# Patient Record
Sex: Female | Born: 1940 | Hispanic: No
Health system: Southern US, Community
[De-identification: ages and names within clinical notes are randomized; demographics above are authoritative.]

## PROBLEM LIST (undated history)

## (undated) DIAGNOSIS — K219 Gastro-esophageal reflux disease without esophagitis: Secondary | ICD-10-CM

## (undated) DIAGNOSIS — M542 Cervicalgia: Secondary | ICD-10-CM

## (undated) DIAGNOSIS — F32A Depression, unspecified: Secondary | ICD-10-CM

## (undated) DIAGNOSIS — M2550 Pain in unspecified joint: Secondary | ICD-10-CM

## (undated) DIAGNOSIS — M519 Unspecified thoracic, thoracolumbar and lumbosacral intervertebral disc disorder: Secondary | ICD-10-CM

## (undated) DIAGNOSIS — M1990 Unspecified osteoarthritis, unspecified site: Secondary | ICD-10-CM

## (undated) DIAGNOSIS — K635 Polyp of colon: Secondary | ICD-10-CM

## (undated) DIAGNOSIS — M545 Low back pain, unspecified: Secondary | ICD-10-CM

## (undated) DIAGNOSIS — M4802 Spinal stenosis, cervical region: Secondary | ICD-10-CM

## (undated) DIAGNOSIS — M8580 Other specified disorders of bone density and structure, unspecified site: Secondary | ICD-10-CM

## (undated) DIAGNOSIS — E7800 Pure hypercholesterolemia, unspecified: Secondary | ICD-10-CM

## (undated) DIAGNOSIS — R9431 Abnormal electrocardiogram [ECG] [EKG]: Secondary | ICD-10-CM

## (undated) DIAGNOSIS — K2270 Barrett's esophagus without dysplasia: Secondary | ICD-10-CM

## (undated) HISTORY — DX: Pure hypercholesterolemia, unspecified: E78.00

## (undated) HISTORY — PX: APPENDECTOMY (OPEN): SHX54

## (undated) HISTORY — DX: Unspecified thoracic, thoracolumbar and lumbosacral intervertebral disc disorder: M51.9

## (undated) HISTORY — DX: Spinal stenosis, cervical region: M48.02

## (undated) HISTORY — DX: Pain in unspecified joint: M25.50

## (undated) HISTORY — DX: Barrett's esophagus without dysplasia: K22.70

## (undated) HISTORY — DX: Low back pain, unspecified: M54.50

## (undated) HISTORY — DX: Unspecified osteoarthritis, unspecified site: M19.90

## (undated) HISTORY — PX: KNEE SURGERY: SHX244

## (undated) HISTORY — DX: Abnormal electrocardiogram (ECG) (EKG): R94.31

## (undated) HISTORY — DX: Depression, unspecified: F32.A

## (undated) HISTORY — DX: Cervicalgia: M54.2

## (undated) HISTORY — DX: Other specified disorders of bone density and structure, unspecified site: M85.80

## (undated) HISTORY — DX: Gastro-esophageal reflux disease without esophagitis: K21.9

## (undated) HISTORY — DX: Polyp of colon: K63.5

---

## 1971-07-08 HISTORY — PX: TUBAL LIGATION: SHX77

## 2002-12-23 ENCOUNTER — Ambulatory Visit
Admit: 2002-12-23 | Disposition: A | Discharge status: Home / Self Care | Payer: Self-pay | Source: Ambulatory Visit | Admitting: Family Medicine

## 2004-12-26 ENCOUNTER — Ambulatory Visit
Admit: 2004-12-26 | Disposition: A | Discharge status: Home / Self Care | Payer: Self-pay | Source: Ambulatory Visit | Admitting: Family Medicine

## 2006-01-13 ENCOUNTER — Emergency Department: Admit: 2006-01-13 | Payer: Self-pay | Source: Emergency Department | Admitting: Emergency Medicine

## 2006-04-03 ENCOUNTER — Ambulatory Visit: Admit: 2006-04-03 | Disposition: A | Discharge status: Home / Self Care | Payer: Self-pay | Source: Ambulatory Visit

## 2006-05-19 ENCOUNTER — Ambulatory Visit
Admit: 2006-05-19 | Disposition: A | Discharge status: Home / Self Care | Payer: Self-pay | Source: Ambulatory Visit | Admitting: Family Medicine

## 2006-08-21 ENCOUNTER — Ambulatory Visit
Admit: 2006-08-21 | Disposition: A | Discharge status: Home / Self Care | Payer: Self-pay | Source: Ambulatory Visit | Admitting: Family Medicine

## 2007-05-01 ENCOUNTER — Ambulatory Visit
Admit: 2007-05-01 | Disposition: A | Discharge status: Home / Self Care | Payer: Self-pay | Source: Ambulatory Visit | Admitting: Family Medicine

## 2007-09-29 ENCOUNTER — Ambulatory Visit
Admit: 2007-09-29 | Disposition: A | Discharge status: Home / Self Care | Payer: Self-pay | Source: Ambulatory Visit | Admitting: Family Medicine

## 2007-10-07 ENCOUNTER — Ambulatory Visit
Admit: 2007-10-07 | Disposition: A | Discharge status: Home / Self Care | Payer: Self-pay | Source: Ambulatory Visit | Admitting: Family Medicine

## 2008-06-16 ENCOUNTER — Ambulatory Visit
Admit: 2008-06-16 | Disposition: A | Discharge status: Home / Self Care | Payer: Self-pay | Source: Ambulatory Visit | Admitting: Medical

## 2009-08-07 ENCOUNTER — Ambulatory Visit
Admit: 2009-08-07 | Disposition: A | Discharge status: Home / Self Care | Payer: Self-pay | Source: Ambulatory Visit | Admitting: Medical

## 2010-06-19 ENCOUNTER — Ambulatory Visit
Admit: 2010-06-19 | Disposition: A | Discharge status: Home / Self Care | Payer: Self-pay | Source: Ambulatory Visit | Admitting: Medical

## 2010-09-21 ENCOUNTER — Ambulatory Visit
Admit: 2010-09-21 | Disposition: A | Discharge status: Home / Self Care | Payer: Self-pay | Source: Ambulatory Visit | Admitting: Medical

## 2010-09-25 ENCOUNTER — Ambulatory Visit
Admit: 2010-09-25 | Disposition: A | Discharge status: Home / Self Care | Payer: Self-pay | Source: Ambulatory Visit | Admitting: Medical

## 2010-10-17 ENCOUNTER — Ambulatory Visit: Admit: 2010-10-17 | Discharge: 2010-10-17 | Payer: Self-pay | Source: Ambulatory Visit

## 2012-01-30 ENCOUNTER — Other Ambulatory Visit: Payer: Self-pay | Admitting: Medical

## 2012-01-30 DIAGNOSIS — Z1231 Encounter for screening mammogram for malignant neoplasm of breast: Secondary | ICD-10-CM

## 2012-02-02 ENCOUNTER — Ambulatory Visit: Payer: Medicare Other

## 2012-02-07 ENCOUNTER — Ambulatory Visit: Payer: Medicare Other

## 2012-02-09 ENCOUNTER — Ambulatory Visit: Payer: Medicare Other | Attending: Medical

## 2012-02-09 DIAGNOSIS — Z1231 Encounter for screening mammogram for malignant neoplasm of breast: Secondary | ICD-10-CM | POA: Insufficient documentation

## 2013-02-01 ENCOUNTER — Ambulatory Visit (INDEPENDENT_AMBULATORY_CARE_PROVIDER_SITE_OTHER): Payer: Self-pay | Admitting: Family Medicine

## 2013-02-02 ENCOUNTER — Ambulatory Visit (INDEPENDENT_AMBULATORY_CARE_PROVIDER_SITE_OTHER): Payer: Self-pay | Admitting: Family Medicine

## 2013-02-18 ENCOUNTER — Ambulatory Visit (INDEPENDENT_AMBULATORY_CARE_PROVIDER_SITE_OTHER): Payer: Medicare Other | Admitting: Family Medicine

## 2013-02-18 ENCOUNTER — Encounter (INDEPENDENT_AMBULATORY_CARE_PROVIDER_SITE_OTHER): Payer: Self-pay | Admitting: Family Medicine

## 2013-02-18 VITALS — BP 107/58 | HR 72 | Ht 60.0 in | Wt 111.6 lb

## 2013-02-18 DIAGNOSIS — M949 Disorder of cartilage, unspecified: Secondary | ICD-10-CM

## 2013-02-18 DIAGNOSIS — Z9889 Other specified postprocedural states: Secondary | ICD-10-CM

## 2013-02-18 DIAGNOSIS — Z01818 Encounter for other preprocedural examination: Secondary | ICD-10-CM

## 2013-02-18 DIAGNOSIS — M549 Dorsalgia, unspecified: Secondary | ICD-10-CM

## 2013-02-18 DIAGNOSIS — M8580 Other specified disorders of bone density and structure, unspecified site: Secondary | ICD-10-CM

## 2013-02-18 DIAGNOSIS — Z139 Encounter for screening, unspecified: Secondary | ICD-10-CM

## 2013-02-18 DIAGNOSIS — K2270 Barrett's esophagus without dysplasia: Secondary | ICD-10-CM

## 2013-02-18 DIAGNOSIS — D696 Thrombocytopenia, unspecified: Secondary | ICD-10-CM

## 2013-02-18 DIAGNOSIS — M542 Cervicalgia: Secondary | ICD-10-CM | POA: Insufficient documentation

## 2013-02-18 DIAGNOSIS — G8929 Other chronic pain: Secondary | ICD-10-CM

## 2013-02-18 NOTE — Progress Notes (Signed)
Subjective:       Patient ID: Michele [name] is a 72 y.o. female.    HPI    Preop: endoscopy, Dr Gwenlyn Perking  . Barrett esophagus      Follow up by Dr Gwenlyn Perking , complains of heart burn, on Prilosec       . S/P colonoscopic polypectomy      In 2009 shows tubulovillous polyps     . Chronic back pain      MRI lumbar spine in 2012 degenerative changes involving the lumbosacral spine. Right lateral disc protrusion at the L3-L4 level associated with right L3 nerve root impingement. 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 Annular tears involving the L4-L5 and L5-S1 intervertebral discs.(2012), stable  not taking  Pain medications      . Osteopenia      Taking Calcium and vit D      . Neck pain      MRI cervical spine in 2011 shows Multilevel degenerative changes of the cervical spine resulting in marked, central canal and bilateral neural foraminal stenoses at multiple levels.       . Thrombocytopenia     family history disease : father  Stress test 2011 normal  Social; house cleaning, married, 4 chidren, from [place]  The following portions of the patient's history were reviewed and updated as appropriate:   She  has a past medical history of Gastroesophageal reflux disease; Barrett's esophagus; Abnormal EKG; Arthralgia; Cervical spinal stenosis; Osteopenia; Depression; Colonic polyp; and Osteoarthritis.  She  does not have any pertinent problems on file.  She  has no past surgical history on file.  Her family history is not on file.  She  reports that she has been smoking Cigarettes.  She has a 25 pack-year smoking history. She does not have any smokeless tobacco history on file. Her alcohol and drug histories not on file.  She has a current medication list which includes the following prescription(s): cholecalciferol, cyanocobalamin, multivitamin, and omeprazole.  No current outpatient prescriptions on file prior to visit.     She  has no known allergies..    Review of Systems    Constitutional: Negative for fever, chills, diaphoresis, activity change, appetite change, fatigue and unexpected weight change.   HENT: Negative for hearing loss, ear pain, nosebleeds, congestion, sore throat, facial swelling, rhinorrhea, sneezing, drooling, mouth sores, trouble swallowing, neck pain, neck stiffness, dental problem, voice change, postnasal drip, tinnitus and ear discharge.    Eyes: Negative for photophobia, pain, discharge, redness, itching and visual disturbance.   Respiratory: Negative for apnea, cough, choking, chest tightness, shortness of breath, wheezing and stridor.    Cardiovascular: Negative for chest pain, palpitations and leg swelling.   Gastrointestinal: Negative for nausea, vomiting, abdominal pain, diarrhea, constipation, blood in stool, abdominal distention, anal bleeding and rectal pain.   Genitourinary: Negative for dysuria, urgency, frequency, hematuria, flank pain, decreased urine volume, vaginal bleeding, vaginal discharge, enuresis, difficulty urinating, genital sores, vaginal pain, menstrual problem, pelvic pain and dyspareunia.   Musculoskeletal: Negative for myalgias, back pain, joint swelling, arthralgias and gait problem.   Skin: Negative for color change, pallor, rash and wound.   Neurological: Negative for dizziness, tremors, seizures, syncope, facial asymmetry, speech difficulty, weakness, light-headedness, numbness and headaches.   Hematological: Negative for adenopathy. Does not bruise/bleed easily.   Psychiatric/Behavioral: Negative for suicidal ideas, hallucinations, behavioral problems, confusion, sleep disturbance, self-injury, dysphoric mood, decreased concentration and agitation. The patient is not nervous/anxious and  is not hyperactive.            Objective:    Physical Exam   Constitutional: She is oriented to person, place, and time. She appears well-developed and well-nourished. No distress.   HENT:   Head: Normocephalic and atraumatic.   Right Ear:  External ear normal.   Left Ear: External ear normal.   Nose: Nose normal.   Mouth/Throat: Oropharynx is clear and moist.   Eyes: Conjunctivae normal and EOM are normal. Pupils are equal, round, and reactive to light. Right eye exhibits no discharge. Left eye exhibits no discharge.   Cardiovascular: Normal rate, regular rhythm, normal heart sounds and intact distal pulses.  Exam reveals no gallop and no friction rub.    No murmur heard.  Pulmonary/Chest: Effort normal and breath sounds normal. No respiratory distress. She has no wheezes. She has no rales. She exhibits no tenderness.   Abdominal: She exhibits no distension and no mass. There is no tenderness. There is no rebound and no guarding.   Musculoskeletal: She exhibits no edema and no tenderness.   Neurological: She is alert and oriented to person, place, and time. She has normal reflexes. No cranial nerve deficit. She exhibits normal muscle tone. Coordination normal.   Skin: No rash noted. She is not diaphoretic. No erythema.   Psychiatric: She has a normal mood and affect. Her behavior is normal. Judgment and thought content normal.           Assessment:       1. Barrett esophagus     2. S/P colonoscopic polypectomy     3. Chronic back pain     4. Osteopenia     5. Neck pain     6. Thrombocytopenia     7. Preop examination                               Plan:       1. Barrett esophagus  Endoscopy pending    2. S/P colonoscopic polypectomy  Colonoscopy normal I    3. Chronic back pain  Stable   4. Osteopenia  Calcium and Vit D    5. Neck pain  Stable    6. Thrombocytopenia  CBC and differential   7. Preop examination  ECG 12 lead    CBC and differential    Comprehensive metabolic panel    Lipid panel    TSH    Urinalysis

## 2013-02-18 NOTE — Addendum Note (Signed)
Addended by: Jeral Pinch on: 02/18/2013 03:23 PM     Modules accepted: Orders

## 2013-02-18 NOTE — Assessment & Plan Note (Signed)
Relevant Hx:  Course:  Daily Update:  Today's Plan:

## 2013-02-21 ENCOUNTER — Encounter (INDEPENDENT_AMBULATORY_CARE_PROVIDER_SITE_OTHER): Payer: Self-pay

## 2013-02-21 ENCOUNTER — Ambulatory Visit (INDEPENDENT_AMBULATORY_CARE_PROVIDER_SITE_OTHER): Payer: Medicare Other

## 2013-02-21 ENCOUNTER — Encounter (INDEPENDENT_AMBULATORY_CARE_PROVIDER_SITE_OTHER): Payer: Self-pay | Admitting: Family Medicine

## 2013-02-21 DIAGNOSIS — D696 Thrombocytopenia, unspecified: Secondary | ICD-10-CM

## 2013-02-22 LAB — COMPREHENSIVE METABOLIC PANEL
ALT: 16 U/L (ref 6–29)
AST (SGOT): 20 U/L (ref 10–35)
Albumin/Globulin Ratio: 1.7 (ref 1.0–2.5)
Albumin: 4.4 G/DL (ref 3.6–5.1)
Alkaline Phosphatase: 63 U/L (ref 33–130)
BUN: 20 MG/DL (ref 7–25)
Bilirubin, Total: 0.6 MG/DL (ref 0.2–1.2)
CO2: 28 mmol/L (ref 19–30)
Calcium: 9.3 MG/DL (ref 8.6–10.4)
Chloride: 104 mmol/L (ref 98–110)
Creatinine: 0.64 mg/dL (ref 0.60–0.93)
EGFR African American: 103 mL/min/{1.73_m2} (ref >=60)
EGFR: 89 mL/min/{1.73_m2} (ref >=60)
Globulin: 2.6 G/DL (ref 1.9–3.7)
Glucose: 86 MG/DL (ref 65–99)
Potassium: 4.3 mmol/L (ref 3.5–5.3)
Protein, Total: 7 G/DL (ref 6.1–8.1)
Sodium: 139 mmol/L (ref 135–146)

## 2013-02-22 LAB — CBC AND DIFFERENTIAL
Atypical Lymphocytes %: 0 %
Baso(Absolute): 17 cells/uL (ref 0–200)
Basophils: 0.4 %
Eosinophils Absolute: 55 cells/uL (ref 15–500)
Eosinophils: 1.3 %
Hematocrit: 39.5 % (ref 35.0–45.0)
Hemoglobin: 12.9 g/dL (ref 11.7–15.5)
Lymphocytes Absolute: 1436 cells/uL (ref 850–3900)
Lymphocytes: 34.2 %
MCH: 30.4 pg (ref 27–33)
MCHC: 32.8 g/dL (ref 32–36)
MCV: 93 fL (ref 80–100)
MPV: 10.9 fL (ref 7.5–11.5)
Monocytes Absolute: 290 cells/uL (ref 200–950)
Monocytes: 6.9 %
Neutrophils Absolute: 2402 cells/uL (ref 1500–7800)
Neutrophils: 57.2 %
Platelets: 128 10*3/uL — ABNORMAL LOW (ref 140–400)
RBC: 4.26 10*6/uL (ref 3.80–5.10)
RDW: 14.3 % (ref 11.0–15.0)
WBC: 4.2 10*3/uL (ref 3.8–10.8)

## 2013-02-22 LAB — LIPID PANEL
Cholesterol / HDL Ratio: 2.6 (ref 0.0–5.0)
Cholesterol: 190 MG/DL (ref 125–200)
HDL: 72 mg/dL (ref >=46)
LDL Calculated: 106 mg/dL (ref <130)
Non HDL Cholesterol (LDL and VLDL): 118 mg/dL
Triglycerides: 60 MG/DL (ref <150)

## 2013-02-22 LAB — URINALYSIS
Bilirubin, UA: NEGATIVE
Blood, UA: NEGATIVE
Glucose Qualitative: NEGATIVE
Ketones UA: NEGATIVE
Leukocyte Esterase, UA: NEGATIVE
NITRITE: NEGATIVE
Protein, UA: NEGATIVE
Specific Gravity, UA: 1.018 (ref 1.001–1.035)
pH: 7.5 (ref 5.0–8.0)

## 2013-02-22 LAB — TSH: TSH: 2.82 mIU/L (ref 0.40–4.50)

## 2013-03-23 ENCOUNTER — Encounter (INDEPENDENT_AMBULATORY_CARE_PROVIDER_SITE_OTHER): Payer: Self-pay | Admitting: Family Medicine

## 2013-04-04 ENCOUNTER — Encounter (INDEPENDENT_AMBULATORY_CARE_PROVIDER_SITE_OTHER): Payer: Self-pay | Admitting: Family Medicine

## 2013-04-04 ENCOUNTER — Telehealth (INDEPENDENT_AMBULATORY_CARE_PROVIDER_SITE_OTHER): Payer: Self-pay | Admitting: Family Medicine

## 2013-04-04 ENCOUNTER — Other Ambulatory Visit (INDEPENDENT_AMBULATORY_CARE_PROVIDER_SITE_OTHER): Payer: Self-pay | Admitting: Family Medicine

## 2013-04-04 ENCOUNTER — Ambulatory Visit (INDEPENDENT_AMBULATORY_CARE_PROVIDER_SITE_OTHER): Payer: Medicare Other | Admitting: Family Medicine

## 2013-04-04 VITALS — BP 105/65 | HR 77 | Ht 60.0 in | Wt 112.8 lb

## 2013-04-04 DIAGNOSIS — D696 Thrombocytopenia, unspecified: Secondary | ICD-10-CM

## 2013-04-04 DIAGNOSIS — Z9889 Other specified postprocedural states: Secondary | ICD-10-CM

## 2013-04-04 DIAGNOSIS — K2270 Barrett's esophagus without dysplasia: Secondary | ICD-10-CM

## 2013-04-04 DIAGNOSIS — M549 Dorsalgia, unspecified: Secondary | ICD-10-CM

## 2013-04-04 DIAGNOSIS — Z1231 Encounter for screening mammogram for malignant neoplasm of breast: Secondary | ICD-10-CM

## 2013-04-04 DIAGNOSIS — M542 Cervicalgia: Secondary | ICD-10-CM

## 2013-04-04 DIAGNOSIS — M8580 Other specified disorders of bone density and structure, unspecified site: Secondary | ICD-10-CM

## 2013-04-04 DIAGNOSIS — Z139 Encounter for screening, unspecified: Secondary | ICD-10-CM

## 2013-04-04 MED ORDER — OMEPRAZOLE 20 MG PO CPDR
20.0000 mg | DELAYED_RELEASE_CAPSULE | Freq: Every day | ORAL | Status: DC
Start: 2013-04-04 — End: 2014-04-04

## 2013-04-04 NOTE — Progress Notes (Signed)
Subjective:       Patient ID: Michele [name] is a 72 y.o. female.      Gastroesophageal Reflux  She reports no abdominal pain, no chest pain, no choking, no coughing, no nausea, no sore throat or no wheezing. Pertinent negatives include no fatigue.         . Barrett esophagus      Follow up by Dr Michele [name] , has no dysplasia complains of heart burn, on Prilosec 20 mg        . S/P colonoscopic polypectomy      In 2009 shows tubulovillous polyps     . Chronic back pain      MRI lumbar spine in 2012 degenerative changes involving the lumbosacral spine. Right lateral disc protrusion at the L3-L4 level associated with right L3 nerve root impingement. 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 Annular tears involving the L4-L5 and L5-S1 intervertebral discs.(2012), stable  not taking  Pain medications      . Osteopenia      Taking Calcium and vit D      . Neck pain      MRI cervical spine in 2011 shows Multilevel degenerative changes of the cervical spine resulting in marked, central canal and bilateral neural foraminal stenoses at multiple levels.       . Thrombocytopenia     family history disease : father ,  Stress test 2011 normal  Social; house cleaning, married, 4 chidren, from [place]  The following portions of the patient's history were reviewed and updated as appropriate:   She  has a past medical history of Gastroesophageal reflux disease; Barrett's esophagus; Abnormal EKG; Arthralgia; Cervical spinal stenosis; Osteopenia; Depression; Colonic polyp; and Osteoarthritis.  She  does not have any pertinent problems on file.  She  has no past surgical history on file.  Her family history is not on file.  She  reports that she has been smoking Cigarettes.  She has a 25 pack-year smoking history. She does not have any smokeless tobacco history on file. Her alcohol and drug histories not on file.  She has a current medication list which includes the following prescription(s):  cholecalciferol, cyanocobalamin, multivitamin, and omeprazole.  Current Outpatient Prescriptions on File Prior to Visit   Medication Sig Dispense Refill   . Cholecalciferol (VITAMIN D PO) Take by mouth.       . Cyanocobalamin (VITAMIN B-12 PO) Take by mouth.       . Multiple Vitamin (MULTIVITAMIN) tablet Take 1 tablet by mouth daily.       Marland Kitchen omeprazole (PRILOSEC) 20 MG capsule Take 20 mg by mouth daily.         She  has no known allergies..    Review of Systems   Constitutional: Negative for fever, chills, diaphoresis, activity change, appetite change, fatigue and unexpected weight change.   HENT: Negative for hearing loss, ear pain, nosebleeds, congestion, sore throat, facial swelling, rhinorrhea, sneezing, drooling, mouth sores, trouble swallowing, neck pain, neck stiffness, dental problem, voice change, postnasal drip, tinnitus and ear discharge.    Eyes: Negative for photophobia, pain, discharge, redness, itching and visual disturbance.   Respiratory: Negative for apnea, cough, choking, chest tightness, shortness of breath, wheezing and stridor.    Cardiovascular: Negative for chest pain, palpitations and leg swelling.   Gastrointestinal: Negative for nausea, vomiting, abdominal pain, diarrhea, constipation, blood in stool, abdominal distention, anal bleeding and rectal pain.   Genitourinary: Negative for dysuria,  urgency, frequency, hematuria, flank pain, decreased urine volume, vaginal bleeding, vaginal discharge, enuresis, difficulty urinating, genital sores, vaginal pain, menstrual problem, pelvic pain and dyspareunia.   Musculoskeletal: Negative for myalgias, back pain, joint swelling, arthralgias and gait problem.   Skin: Negative for color change, pallor, rash and wound.   Neurological: Negative for dizziness, tremors, seizures, syncope, facial asymmetry, speech difficulty, weakness, light-headedness, numbness and headaches.   Hematological: Negative for adenopathy. Does not bruise/bleed easily.    Psychiatric/Behavioral: Negative for suicidal ideas, hallucinations, behavioral problems, confusion, sleep disturbance, self-injury, dysphoric mood, decreased concentration and agitation. The patient is not nervous/anxious and is not hyperactive.            Objective:    Physical Exam   Constitutional: She is oriented to person, place, and time. She appears well-developed and well-nourished. No distress.   HENT:   Head: Normocephalic and atraumatic.   Right Ear: External ear normal.   Left Ear: External ear normal.   Nose: Nose normal.   Mouth/Throat: Oropharynx is clear and moist.   Eyes: Conjunctivae normal and EOM are normal. Pupils are equal, round, and reactive to light. Right eye exhibits no discharge. Left eye exhibits no discharge.   Cardiovascular: Normal rate, regular rhythm, normal heart sounds and intact distal pulses.  Exam reveals no gallop and no friction rub.    No murmur heard.  Pulmonary/Chest: Effort normal and breath sounds normal. No respiratory distress. She has no wheezes. She has no rales. She exhibits no tenderness.   Abdominal: She exhibits no distension and no mass. There is no tenderness. There is no rebound and no guarding.   Musculoskeletal: She exhibits no edema and no tenderness.   Neurological: She is alert and oriented to person, place, and time. She has normal reflexes. No cranial nerve deficit. She exhibits normal muscle tone. Coordination normal.   Skin: No rash noted. She is not diaphoretic. No erythema.   Psychiatric: She has a normal mood and affect. Her behavior is normal. Judgment and thought content normal.           Assessment:       1. Barrett esophagus     2. Chronic back pain     3. Neck pain     4. Osteopenia     5. S/P colonoscopic polypectomy     6. Thrombocytopenia                   Plan:       1. Barrett esophagus  Prilosec 20 mg    2. Chronic back pain  stable   3. Neck pain  satble    4. Osteopenia  Vitamin D 25 hydroxy   5. S/P colonoscopic polypectomy   Colonoscopy in 2 years    6. Thrombocytopenia  CBC and differential    Vitamin B12    Folate

## 2013-04-04 NOTE — Telephone Encounter (Signed)
Ruby from [facility] called and mammogram needs V76.12 added as diagnosis per Medicare guidelines.

## 2013-04-04 NOTE — Progress Notes (Signed)
Has the patient sought any care outside of the [facility]? YES     Dr Nussbaum    -Refer to care team-   Or  List Specialists:

## 2013-04-05 ENCOUNTER — Ambulatory Visit: Payer: Medicare Other | Attending: Family Medicine

## 2013-04-05 DIAGNOSIS — Z1231 Encounter for screening mammogram for malignant neoplasm of breast: Secondary | ICD-10-CM | POA: Insufficient documentation

## 2013-04-06 LAB — VITAMIN D-25 HYDROXY (D2/D3/TOTAL)
25-Hydroxy D2: <4 ng/mL
Vitamin D 25-OH D3: 42 ng/mL
Vitamin D 25-OH Total: 42 ng/mL (ref 30–100)

## 2013-04-06 LAB — CBC AND DIFFERENTIAL
Atypical Lymphocytes %: 0 %
Baso(Absolute): 20 cells/uL (ref 0–200)
Basophils: 0.5 %
Eosinophils Absolute: 28 cells/uL (ref 15–500)
Eosinophils: 0.7 %
Hematocrit: 38.1 % (ref 35.0–45.0)
Hemoglobin: 13.1 g/dL (ref 11.7–15.5)
Lymphocytes Absolute: 968 cells/uL (ref 850–3900)
Lymphocytes: 24.2 %
MCH: 32 pg (ref 27–33)
MCHC: 34.4 g/dL (ref 32–36)
MCV: 93 fL (ref 80–100)
MPV: 10.9 fL (ref 7.5–11.5)
Monocytes Absolute: 312 cells/uL (ref 200–950)
Monocytes: 7.8 %
Neutrophils Absolute: 2672 cells/uL (ref 1500–7800)
Neutrophils: 66.8 %
Platelets: 133 10*3/uL — ABNORMAL LOW (ref 140–400)
RBC: 4.09 10*6/uL (ref 3.80–5.10)
RDW: 13.6 % (ref 11.0–15.0)
WBC: 4 10*3/uL (ref 3.8–10.8)

## 2013-04-06 LAB — FOLATE: Folate: >24 ng/mL (ref >5.4)

## 2013-04-06 LAB — VITAMIN B12: Vitamin B-12: 857 pg/mL (ref 200–1100)

## 2013-04-22 ENCOUNTER — Encounter (INDEPENDENT_AMBULATORY_CARE_PROVIDER_SITE_OTHER): Payer: Self-pay | Admitting: Family Medicine

## 2013-04-22 ENCOUNTER — Ambulatory Visit (INDEPENDENT_AMBULATORY_CARE_PROVIDER_SITE_OTHER): Payer: Medicare Other | Admitting: Family Medicine

## 2013-04-22 ENCOUNTER — Ambulatory Visit
Admission: RE | Admit: 2013-04-22 | Discharge: 2013-04-22 | Disposition: A | Discharge status: Home / Self Care | Payer: Medicare Other | Source: Ambulatory Visit | Attending: Family Medicine | Admitting: Family Medicine

## 2013-04-22 VITALS — BP 113/68 | HR 79 | Temp 98.7°F | Wt 114.8 lb

## 2013-04-22 DIAGNOSIS — Z9889 Other specified postprocedural states: Secondary | ICD-10-CM

## 2013-04-22 DIAGNOSIS — M542 Cervicalgia: Secondary | ICD-10-CM

## 2013-04-22 DIAGNOSIS — K2270 Barrett's esophagus without dysplasia: Secondary | ICD-10-CM

## 2013-04-22 DIAGNOSIS — R059 Cough, unspecified: Secondary | ICD-10-CM

## 2013-04-22 DIAGNOSIS — R079 Chest pain, unspecified: Secondary | ICD-10-CM

## 2013-04-22 DIAGNOSIS — G8929 Other chronic pain: Secondary | ICD-10-CM

## 2013-04-22 DIAGNOSIS — M549 Dorsalgia, unspecified: Secondary | ICD-10-CM

## 2013-04-22 DIAGNOSIS — M949 Disorder of cartilage, unspecified: Secondary | ICD-10-CM

## 2013-04-22 DIAGNOSIS — D696 Thrombocytopenia, unspecified: Secondary | ICD-10-CM

## 2013-04-22 MED ORDER — AZITHROMYCIN 250 MG PO TABS
ORAL_TABLET | ORAL | Status: DC
Start: 2013-04-22 — End: 2013-10-06

## 2013-04-22 MED ORDER — ALBUTEROL SULFATE HFA 108 (90 BASE) MCG/ACT IN AERS
2.0000 | INHALATION_SPRAY | Freq: Four times a day (QID) | RESPIRATORY_TRACT | Status: AC | PRN
Start: 2013-04-22 — End: 2014-04-22

## 2013-04-22 NOTE — Progress Notes (Signed)
Subjective:       Patient ID: Michele [name] is a 72 y.o. female.      Cough  This is a new problem. The current episode started in the past 7 days. The problem has been gradually improving. The cough is productive of purulent sputum. Associated symptoms include chest pain. Pertinent negatives include no chills, fever, hemoptysis, myalgias, rash or sore throat. The symptoms are aggravated by lying down. She has tried OTC cough suppressant for the symptoms. The treatment provided moderate relief. There is no history of asthma or environmental allergies.   Chest Pain   This is a new problem. The current episode started in the past 7 days. The onset quality is sudden. The problem has been resolved. The pain is present in the substernal region and lateral region. The pain is moderate. The quality of the pain is described as crushing. Associated symptoms include a cough. Pertinent negatives include no abdominal pain, diaphoresis, exertional chest pressure, fever, hemoptysis, irregular heartbeat, leg pain, nausea, near-syncope, orthopnea, palpitations, syncope or vomiting.     Smoking: 10 cig/day for 50 years     . Barrett esophagus      Follow up by Dr Gwenlyn Perking , has no dysplasia complains of heart burn, better on Prilosec 20 mg        . S/P colonoscopic polypectomy      In 2009 shows tubulovillous polyps     . Chronic back pain      MRI lumbar spine in 2012 degenerative changes involving the lumbosacral spine. Right lateral disc protrusion at the L3-L4 level associated with right L3 nerve root impingement. 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 Annular tears involving the L4-L5 and L5-S1 intervertebral discs.(2012), stable  not taking  Pain medications      . Osteopenia      Taking Calcium and vit D      . Neck pain      MRI cervical spine in 2011 shows Multilevel degenerative changes of the cervical spine resulting in marked, central canal and bilateral neural foraminal stenoses at  multiple levels.       . Thrombocytopenia     family history disease : father died at 60 yeras ,  Stress test 2011 normal  Social; house cleaning, married, 4 chidren, from [place]  The following portions of the patient's history were reviewed and updated as appropriate:   She  has a past medical history of Gastroesophageal reflux disease; Barrett's esophagus; Abnormal EKG; Arthralgia; Cervical spinal stenosis; Osteopenia; Depression; Colonic polyp; and Osteoarthritis.  She  does not have any pertinent problems on file.  She  has no past surgical history on file.  Her family history is negative for Breast cancer.  She  reports that she has been smoking Cigarettes.  She has a 25 pack-year smoking history. She does not have any smokeless tobacco history on file. Her alcohol and drug histories not on file.  She has a current medication list which includes the following prescription(s): cholecalciferol, cyanocobalamin, multivitamin, and omeprazole.  Current Outpatient Prescriptions on File Prior to Visit   Medication Sig Dispense Refill   . Cholecalciferol (VITAMIN D PO) Take by mouth.       . Cyanocobalamin (VITAMIN B-12 PO) Take by mouth.       . Multiple Vitamin (MULTIVITAMIN) tablet Take 1 tablet by mouth daily.       Marland Kitchen omeprazole (PRILOSEC) 20 MG capsule Take 1 capsule (20 mg total)  by mouth daily.  90 capsule  3     She  has no known allergies..    Review of Systems   Constitutional: Negative for fever, chills, diaphoresis, activity change, appetite change and unexpected weight change.   HENT: Negative for sneezing, sore throat, tinnitus, trouble swallowing and voice change.    Eyes: Negative for photophobia.   Respiratory: Positive for cough. Negative for hemoptysis.    Cardiovascular: Positive for chest pain. Negative for palpitations, orthopnea, leg swelling, syncope and near-syncope.   Gastrointestinal: Negative for nausea, vomiting, abdominal pain, diarrhea, constipation, blood in stool, anal bleeding and  rectal pain.   Genitourinary: Negative for hematuria.   Musculoskeletal: Negative for myalgias, neck pain and neck stiffness.   Skin: Negative for color change, pallor, rash and wound.   Hematological: Negative for environmental allergies.   Psychiatric/Behavioral: Negative for suicidal ideas, sleep disturbance and self-injury. The patient is not nervous/anxious and is not hyperactive.            Objective:    Physical Exam   Constitutional: She is oriented to person, place, and time. She appears well-developed and well-nourished. No distress.   HENT:   Head: Normocephalic and atraumatic.   Right Ear: External ear normal.   Left Ear: External ear normal.   Nose: Nose normal.   Mouth/Throat: Oropharynx is clear and moist.   Eyes: Conjunctivae normal and EOM are normal. Pupils are equal, round, and reactive to light. Right eye exhibits no discharge. Left eye exhibits no discharge.   Cardiovascular: Normal rate, regular rhythm, normal heart sounds and intact distal pulses.  Exam reveals no gallop and no friction rub.    No murmur heard.  Pulmonary/Chest: Effort normal and breath sounds normal. No respiratory distress. She has no wheezes. She has no rales. She exhibits no tenderness.   Abdominal: She exhibits no distension and no mass. There is no tenderness. There is no rebound and no guarding.   Musculoskeletal: She exhibits no edema and no tenderness.   Neurological: She is alert and oriented to person, place, and time. She has normal reflexes. No cranial nerve deficit. She exhibits normal muscle tone. Coordination normal.   Skin: No rash noted. She is not diaphoretic. No erythema.   Psychiatric: She has a normal mood and affect. Her behavior is normal. Judgment and thought content normal.                 Assessment:/Plan:             1. Barrett esophagus  Prilosec 20 mg    2. Chronic back pain  stable   3. Neck pain  stable   4. Osteopenia  Calcium Vit D    5. S/P colonoscopic polypectomy     6. Thrombocytopenia   Ambulatory referral to Hematology   7. Chest pain  XR Chest 2 Views, to go to ER if chest pain reoccurs     ECG 12 lead    Ambulatory referral to Cardiology    azithromycin (ZITHROMAX Z-PAK) 250 MG tablet    albuterol (PROAIR HFA) 108 (90 BASE) MCG/ACT inhaler   8. Cough  XR Chest 2 Views    azithromycin (ZITHROMAX Z-PAK) 250 MG tablet    albuterol (PROAIR HFA) 108 (90 BASE) MCG/ACT inhaler

## 2013-04-25 ENCOUNTER — Encounter (INDEPENDENT_AMBULATORY_CARE_PROVIDER_SITE_OTHER): Payer: Self-pay | Admitting: Family Medicine

## 2013-04-30 ENCOUNTER — Ambulatory Visit
Admission: RE | Admit: 2013-04-30 | Discharge: 2013-04-30 | Disposition: A | Discharge status: Home / Self Care | Payer: Medicare Other | Source: Ambulatory Visit | Attending: Family | Admitting: Family

## 2013-08-02 ENCOUNTER — Encounter (INDEPENDENT_AMBULATORY_CARE_PROVIDER_SITE_OTHER): Payer: Self-pay | Admitting: Family Medicine

## 2013-08-05 ENCOUNTER — Encounter (INDEPENDENT_AMBULATORY_CARE_PROVIDER_SITE_OTHER): Payer: Self-pay | Admitting: Family Medicine

## 2013-08-30 ENCOUNTER — Other Ambulatory Visit (INDEPENDENT_AMBULATORY_CARE_PROVIDER_SITE_OTHER): Payer: Self-pay | Admitting: Hematology & Oncology

## 2013-08-30 ENCOUNTER — Encounter (INDEPENDENT_AMBULATORY_CARE_PROVIDER_SITE_OTHER): Payer: Self-pay | Admitting: Hematology & Oncology

## 2013-08-30 ENCOUNTER — Ambulatory Visit (INDEPENDENT_AMBULATORY_CARE_PROVIDER_SITE_OTHER): Payer: Medicare Other | Admitting: Hematology & Oncology

## 2013-08-30 VITALS — BP 113/70 | HR 60 | Temp 98.1°F | Ht <= 58 in | Wt 116.4 lb

## 2013-08-30 DIAGNOSIS — Z029 Encounter for administrative examinations, unspecified: Secondary | ICD-10-CM

## 2013-08-30 DIAGNOSIS — D696 Thrombocytopenia, unspecified: Secondary | ICD-10-CM

## 2013-08-30 LAB — POCT AMB CBC W/ AUTO DIFF
Granulocytes Abs: 3
Granulocytes: 64.3
Hematocrit POCT: 37.2
Hgb POCT: 12.5 g/dL (ref 12–15.5)
Lymphocytes Absolute POCT: 1.4
Lymphocytes Automated: 29.8
MCH, POC: 30.2
MCHC: 33.6
MCV: 89.8
MPV: 8.9
Monocytes Absolute POCT: 0.3
Monocytes: 5.9
Platelet Count POCT: 125
RBC: 4.14
RDW POCT: 14.2
WBC: 4.7

## 2013-08-30 NOTE — Progress Notes (Signed)
Outpatient Consultation    Patient Name: [facility]      Assessment:   Asymptomatic mild thrombocytopenia.  I will rule out secondary causes with  the blood work below.  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  It will not need any treatment.  She should get  monitoring every 3 to 4 months, and I am happy to do that at our clinic.   Because her platelets have nearly normal, it is not worth during a  prednisone test to see if we can normalize her platelets with prednisone.        Plan:     1. Thrombocytopenia  POCT AMB CBC W/ AUTO DIFF    POCT AMB CBC W/ AUTO DIFF    Peripheral blood smear    Hepatitis B (HBV) Surface Antigen    Hepatitis C (HCV) antibody, Total    ANA Screen    Rheumatoid factor         There are no Patient Instructions on file for this visit.  Return in about 3 months (around 11/27/2013).    Thank you as always, Dr. Wyvonnia Lora, for the opportunity to participate in the care of this patient.  Please do not hesitate to call with any questions.    Chief Complaint:Thrombocytopenia.        Past Hematologic/Oncologic Hx: A 73 year old [place] female presents with asymptomatic mild  thrombocytopenia.  Previous platelet counts are as below.  We also measured  a platelet count here today which measured at 125.  She does not take any  medications other than proton pump inhibitors.  She does not take any  over-the-counter medication.  No history of bleeding or bruising.  No  history of blood transfusions.  No history of titers B or C.  She otherwise  feels terrific, exercises regularly, and is eating well.        Component      Latest Ref Rng 02/21/2013 04/04/2013   Platelet Count      140 - 400 Thousand/uL 128 (L) 133 (L)       Other PMH:  Past Medical History   Diagnosis Date   . Gastroesophageal reflux disease    . Barrett's esophagus    . Abnormal EKG    . Arthralgia    . Cervical spinal stenosis    . Osteopenia    . Depression    . Colonic polyp    . Osteoarthritis      Past  Surgical History   Procedure Date   . Appendectomy      when was a teenager       SH:   History     Social History   . Marital Status: Married     Spouse Name: N/A     Number of Children: N/A   . Years of Education: N/A     Social History Main Topics   . Smoking status: Current Every Day Smoker -- 0.5 packs/day for 50 years     Types: Cigarettes   . Smokeless tobacco: Never Used   . Alcohol Use: No   . Drug Use: No   . Sexually Active: None     Other Topics Concern   . None     Social History Narrative   . None       FH:   Family History   Problem Relation Age of Onset   . Breast cancer Neg Hx  Allergies No Known Allergies    The following portions of the patient's history were reviewed and updated as appropriate: allergies, current medications, past family history, past medical history, past social history, past surgical history and problem list.    Subjective:   She feels well and denies any complaints at this time.        Outpatient Medications:     Current Outpatient Prescriptions   Medication Sig Dispense Refill   . albuterol (PROAIR HFA) 108 (90 BASE) MCG/ACT inhaler Inhale 2 puffs into the lungs every 6 (six) hours as needed for Wheezing.  1 Inhaler  0   . azithromycin (ZITHROMAX Z-PAK) 250 MG tablet Two tablets PO QD day 1 then 1 tablet PO QD from day 2 to day 5  6 tablet  0   . Cholecalciferol (VITAMIN D PO) Take by mouth.       . Cyanocobalamin (VITAMIN B-12 PO) Take by mouth.       . Multiple Vitamin (MULTIVITAMIN) tablet Take 1 tablet by mouth daily.       Marland Kitchen omeprazole (PRILOSEC) 20 MG capsule Take 1 capsule (20 mg total) by mouth daily.  90 capsule  3         Review of Systems:   All other systems were reviewed and were negative:     Physical Exam:   BP 113/70  Pulse 60  Temp 98.1 F (36.7 C)  Ht 1.473 m (4\' 10" )  Wt 52.799 kg (116 lb 6.4 oz)  BMI 24.33 kg/m2      General  AAOx3, NAD  HEENT Anicteric, Oropharynx pink  CV RRR no MRG  Lungs CTA B  Abdomen NT/ND, no HSM  Extremities no  C/C/E  Skin no bruising  LNs negative cervical supraclavicular axillary inguinal    Results     Procedure Component Value Units Date/Time    POCT AMB CBC W/ AUTO DIFF [[id number]] Collected:08/30/13 1436    Specimen Information:Blood Updated:08/30/13 1439     WBC 4.7      Lymphocytes Automated 29.8      Monocytes 5.9      Grans 64.3      Lymphs Abs 1.4      Monos Abs 0.3      Granulocytes Abs 3.0      RBC 4.14      Hgb POCT 12.5 g/dL      Hematocrit 84.6      MCV 89.8      MCH, POC 30.2      MCHC 33.6      RDW-CV 14.2      Platelet Count, POC 125      MPV 8.9

## 2013-09-01 LAB — HEPATITIS C ANTIBODY
HCV SIGNAL TO CUTOFF: 0.03
Hepatitis C, AB: NONREACTIVE

## 2013-09-01 LAB — ANA SCREEN, IFA, WITH REFLEX TO TITER AND PATTERN: ANA Screen, IFA: NEGATIVE

## 2013-09-01 LAB — HEPATITIS B SURFACE ANTIGEN W/ REFLEX TO CONFIRMATION: Hepatitis B Surface Antigen: NONREACTIVE

## 2013-09-01 LAB — RHEUMATOID FACTOR: Rheumatoid Factor: 10 IU/mL (ref <14)

## 2013-09-27 ENCOUNTER — Institutional Professional Consult (permissible substitution) (INDEPENDENT_AMBULATORY_CARE_PROVIDER_SITE_OTHER): Payer: Medicare Other | Admitting: Hematology & Oncology

## 2013-10-06 ENCOUNTER — Encounter (INDEPENDENT_AMBULATORY_CARE_PROVIDER_SITE_OTHER): Payer: Self-pay | Admitting: Family Medicine

## 2013-10-06 ENCOUNTER — Ambulatory Visit (INDEPENDENT_AMBULATORY_CARE_PROVIDER_SITE_OTHER): Payer: Medicare Other | Admitting: Family Medicine

## 2013-10-06 VITALS — BP 120/65 | HR 69 | Temp 98.6°F | Ht 58.66 in | Wt 117.0 lb

## 2013-10-06 DIAGNOSIS — D696 Thrombocytopenia, unspecified: Secondary | ICD-10-CM

## 2013-10-06 DIAGNOSIS — R259 Unspecified abnormal involuntary movements: Secondary | ICD-10-CM

## 2013-10-06 DIAGNOSIS — R251 Tremor, unspecified: Secondary | ICD-10-CM

## 2013-10-06 DIAGNOSIS — R197 Diarrhea, unspecified: Secondary | ICD-10-CM

## 2013-10-06 NOTE — Progress Notes (Signed)
Subjective:       Patient ID: Michele [name] is a 73 y.o. female.    Diarrhea   This is a new problem. The current episode started in the past 7 days. The problem has been resolved. The stool consistency is described as watery. Associated symptoms include abdominal pain. Pertinent negatives include no fever or vomiting.   Abdominal Pain  This is a new problem. The current episode started in the past 7 days. The problem occurs constantly. The problem has been resolved. The pain is located in the generalized abdominal region. The pain is moderate. The quality of the pain is cramping. Associated symptoms include diarrhea. Pertinent negatives include no anorexia, fever, melena or vomiting. Nothing aggravates the pain. She has tried nothing for the symptoms. The treatment provided no relief.       Smoking: 10 cig/day for 50 years     . Barrett esophagus      Follow up by Dr Gwenlyn Perking , has no dysplasia complains of heart burn, better on Prilosec 20 mg        . S/P colonoscopic polypectomy      In 2009 shows tubulovillous polyps     . Chronic back pain      MRI lumbar spine in 2012 degenerative changes involving the lumbosacral spine. Right lateral disc protrusion at the L3-L4 level associated with right L3 nerve root impingement. 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 Annular tears involving the L4-L5 and L5-S1 intervertebral discs.(2012), stable  not taking  Pain medications      . Osteopenia      Taking Calcium and vit D      . Neck pain      MRI cervical spine in 2011 shows Multilevel degenerative changes of the cervical spine resulting in marked, central canal and bilateral neural foraminal stenoses at multiple levels.       . Thrombocytopenia..  Lab Results   Component Value Date    WBC 4.7 08/30/2013    HGB 12.5 08/30/2013    HCT 37.2 08/30/2013    MCV 89.8 08/30/2013    PLT 133* 04/04/2013        family history disease : father died at 79 yeras ,  Stress test 2011 normal  Social; house  cleaning, married, 4 chidren, from [place]  The following portions of the patient's history were reviewed  The following portions of the patient's history were reviewed and updated as appropriate:   She  has a past medical history of Gastroesophageal reflux disease; Barrett's esophagus; Abnormal EKG; Arthralgia; Cervical spinal stenosis; Osteopenia; Depression; Colonic polyp; and Osteoarthritis.  She  does not have any pertinent problems on file.  She  has past surgical history that includes Appendectomy.  Her family history is negative for Breast cancer.  She  reports that she has been smoking Cigarettes.  She has a 25 pack-year smoking history. She has never used smokeless tobacco. She reports that she does not drink alcohol or use illicit drugs.  She has a current medication list which includes the following prescription(s): albuterol, cholecalciferol, cyanocobalamin, multivitamin, and omeprazole.  Current Outpatient Prescriptions on File Prior to Visit   Medication Sig Dispense Refill   . albuterol (PROAIR HFA) 108 (90 BASE) MCG/ACT inhaler Inhale 2 puffs into the lungs every 6 (six) hours as needed for Wheezing.  1 Inhaler  0   . Cholecalciferol (VITAMIN D PO) Take by mouth.       Marland Kitchen  Cyanocobalamin (VITAMIN B-12 PO) Take by mouth.       . Multiple Vitamin (MULTIVITAMIN) tablet Take 1 tablet by mouth daily.       Marland Kitchen omeprazole (PRILOSEC) 20 MG capsule Take 1 capsule (20 mg total) by mouth daily.  90 capsule  3   . [DISCONTINUED] azithromycin (ZITHROMAX Z-PAK) 250 MG tablet Two tablets PO QD day 1 then 1 tablet PO QD from day 2 to day 5  6 tablet  0     She  has no known allergies..    Review of Systems   Constitutional: Negative for fever.   Gastrointestinal: Positive for abdominal pain and diarrhea. Negative for vomiting, melena and anorexia.           Objective:    Physical Exam   Constitutional: She appears well-developed and well-nourished. No distress.   HENT:   Head: Normocephalic and atraumatic.   Right Ear:  External ear normal.   Left Ear: External ear normal.   Nose: Nose normal.   Mouth/Throat: Oropharynx is clear and moist. No oropharyngeal exudate.   Cardiovascular: Normal rate, regular rhythm and normal heart sounds.  Exam reveals no gallop and no friction rub.    No murmur heard.  Pulmonary/Chest: No respiratory distress. She has no wheezes. She has no rales. She exhibits no tenderness.   Abdominal: She exhibits no distension and no mass. There is no tenderness. There is no rebound and no guarding.   Skin: She is not diaphoretic.           Assessment:       1. Thrombocytopenia             2. Diarrhea           Plan:       1. Thrombocytopenia  CBC and differential    Comprehensive metabolic panel    TSH   2. Diarrhea  Resolved, increase fluids

## 2013-10-06 NOTE — Addendum Note (Signed)
Addended by: Jeral Pinch on: 10/06/2013 05:13 PM     Modules accepted: Orders

## 2013-10-14 ENCOUNTER — Ambulatory Visit (INDEPENDENT_AMBULATORY_CARE_PROVIDER_SITE_OTHER): Payer: Medicare Other | Admitting: Family Medicine

## 2013-10-17 ENCOUNTER — Ambulatory Visit (INDEPENDENT_AMBULATORY_CARE_PROVIDER_SITE_OTHER): Payer: Medicare Other

## 2013-10-17 ENCOUNTER — Other Ambulatory Visit (INDEPENDENT_AMBULATORY_CARE_PROVIDER_SITE_OTHER): Payer: Self-pay

## 2013-10-17 DIAGNOSIS — D696 Thrombocytopenia, unspecified: Secondary | ICD-10-CM

## 2013-10-18 LAB — COMPREHENSIVE METABOLIC PANEL
ALT: 16 U/L (ref 6–29)
AST (SGOT): 23 U/L (ref 10–35)
Albumin/Globulin Ratio: 1.8 (ref 1.0–2.5)
Albumin: 4.2 G/DL (ref 3.6–5.1)
Alkaline Phosphatase: 62 U/L (ref 33–130)
BUN: 20 MG/DL (ref 7–25)
Bilirubin, Total: 0.5 MG/DL (ref 0.2–1.2)
CO2: 28 mmol/L (ref 19–30)
Calcium: 9.3 MG/DL (ref 8.6–10.4)
Chloride: 106 mmol/L (ref 98–110)
Creatinine: 0.61 mg/dL (ref 0.60–0.93)
EGFR African American: 104 mL/min/{1.73_m2} (ref >=60)
EGFR: 89 mL/min/{1.73_m2} (ref >=60)
Globulin: 2.3 G/DL (ref 1.9–3.7)
Glucose: 87 MG/DL (ref 65–99)
Potassium: 4.7 mmol/L (ref 3.5–5.3)
Protein, Total: 6.5 G/DL (ref 6.1–8.1)
Sodium: 143 mmol/L (ref 135–146)

## 2013-10-18 LAB — CBC AND DIFFERENTIAL
Atypical Lymphocytes %: 0 %
Baso(Absolute): 18 cells/uL (ref 0–200)
Basophils: 0.4 %
Eosinophils Absolute: 87 cells/uL (ref 15–500)
Eosinophils: 1.9 %
Hematocrit: 38.4 % (ref 35.0–45.0)
Hemoglobin: 12.7 g/dL (ref 11.7–15.5)
Lymphocytes Absolute: 1463 cells/uL (ref 850–3900)
Lymphocytes: 31.8 %
MCH: 30.3 pg (ref 27–33)
MCHC: 33.1 g/dL (ref 32–36)
MCV: 92 fL (ref 80–100)
MPV: 10.1 fL (ref 7.5–11.5)
Monocytes Absolute: 428 cells/uL (ref 200–950)
Monocytes: 9.3 %
Neutrophils Absolute: 2604 cells/uL (ref 1500–7800)
Neutrophils: 56.6 %
Platelets: 144 10*3/uL (ref 140–400)
RBC: 4.19 10*6/uL (ref 3.80–5.10)
RDW: 13.9 % (ref 11.0–15.0)
WBC: 4.6 10*3/uL (ref 3.8–10.8)

## 2013-10-18 LAB — TSH: TSH: 2.77 mIU/L (ref 0.40–4.50)

## 2013-10-26 ENCOUNTER — Ambulatory Visit
Admission: RE | Admit: 2013-10-26 | Discharge: 2013-10-26 | Disposition: A | Discharge status: Home / Self Care | Payer: Medicare Other | Source: Ambulatory Visit | Attending: Specialist | Admitting: Specialist

## 2013-10-26 DIAGNOSIS — G5600 Carpal tunnel syndrome, unspecified upper limb: Secondary | ICD-10-CM | POA: Insufficient documentation

## 2013-10-26 DIAGNOSIS — G252 Other specified forms of tremor: Secondary | ICD-10-CM | POA: Insufficient documentation

## 2013-10-26 DIAGNOSIS — G250 Essential tremor: Secondary | ICD-10-CM | POA: Insufficient documentation

## 2013-10-26 DIAGNOSIS — G2581 Restless legs syndrome: Secondary | ICD-10-CM | POA: Insufficient documentation

## 2013-10-26 LAB — CBC
Hematocrit: 41.9 % (ref 37.0–47.0)
Hgb: 13.1 g/dL (ref 12.0–16.0)
MCH: 29.6 pg (ref 28.0–32.0)
MCHC: 31.3 g/dL — ABNORMAL LOW (ref 32.0–36.0)
MCV: 94.6 fL (ref 80.0–100.0)
MPV: 11.9 fL (ref 9.4–12.3)
Nucleated RBC: 0 /100 WBC (ref 0–1)
Platelets: 146 10*3/uL (ref 140–400)
RBC: 4.43 10*6/uL (ref 4.20–5.40)
RDW: 12 % (ref 12–15)
WBC: 4.1 10*3/uL (ref 3.50–10.80)

## 2013-10-26 LAB — IRON PROFILE
Iron Saturation: 26 % (ref 15–50)
Iron: 77 ug/dL (ref 40–145)
TIBC: 297 ug/dL (ref 265–497)
UIBC: 220 ug/dL (ref 126–382)

## 2013-10-26 LAB — FERRITIN: Ferritin: 75.34 ng/mL (ref 4.60–204.00)

## 2013-10-26 LAB — VITAMIN B12: Vitamin B-12: 955 pg/mL — ABNORMAL HIGH (ref 211–911)

## 2013-10-26 LAB — HEMOLYSIS INDEX: Hemolysis Index: 9 (ref 0–18)

## 2013-10-28 ENCOUNTER — Ambulatory Visit
Admission: RE | Admit: 2013-10-28 | Discharge: 2013-10-28 | Disposition: A | Discharge status: Home / Self Care | Payer: Medicare Other | Source: Ambulatory Visit | Attending: Specialist | Admitting: Specialist

## 2013-10-28 DIAGNOSIS — G5600 Carpal tunnel syndrome, unspecified upper limb: Secondary | ICD-10-CM | POA: Insufficient documentation

## 2013-10-28 DIAGNOSIS — G2581 Restless legs syndrome: Secondary | ICD-10-CM | POA: Insufficient documentation

## 2013-10-28 DIAGNOSIS — G252 Other specified forms of tremor: Secondary | ICD-10-CM | POA: Insufficient documentation

## 2013-10-28 DIAGNOSIS — G250 Essential tremor: Secondary | ICD-10-CM | POA: Insufficient documentation

## 2013-10-28 LAB — GLUCOSE TOLERANCE, 3 HOURS
GTT - 1 hr Specimen: 151 mg/dL
GTT - 2 hr Specimen: 83 mg/dL
GTT - 3 hr Specimen: 68 mg/dL
GTT - Fasting Specimen: 101 mg/dL — ABNORMAL HIGH (ref 70–100)

## 2013-11-22 ENCOUNTER — Encounter (INDEPENDENT_AMBULATORY_CARE_PROVIDER_SITE_OTHER): Payer: Self-pay | Admitting: Family Medicine

## 2013-12-15 ENCOUNTER — Ambulatory Visit (INDEPENDENT_AMBULATORY_CARE_PROVIDER_SITE_OTHER): Payer: Medicare Other | Admitting: Family Medicine

## 2013-12-16 ENCOUNTER — Ambulatory Visit (INDEPENDENT_AMBULATORY_CARE_PROVIDER_SITE_OTHER): Payer: Medicare Other | Admitting: Family Medicine

## 2013-12-19 ENCOUNTER — Encounter (INDEPENDENT_AMBULATORY_CARE_PROVIDER_SITE_OTHER): Payer: Self-pay | Admitting: Family Medicine

## 2013-12-19 ENCOUNTER — Ambulatory Visit
Admission: RE | Admit: 2013-12-19 | Discharge: 2013-12-19 | Disposition: A | Discharge status: Home / Self Care | Payer: Medicare Other | Source: Ambulatory Visit | Attending: Family Medicine | Admitting: Family Medicine

## 2013-12-19 ENCOUNTER — Ambulatory Visit (INDEPENDENT_AMBULATORY_CARE_PROVIDER_SITE_OTHER): Payer: Medicare Other | Admitting: Family Medicine

## 2013-12-19 VITALS — BP 112/52 | HR 72 | Temp 98.9°F | Ht <= 58 in | Wt 115.0 lb

## 2013-12-19 DIAGNOSIS — M79606 Pain in leg, unspecified: Secondary | ICD-10-CM

## 2013-12-19 DIAGNOSIS — D696 Thrombocytopenia, unspecified: Secondary | ICD-10-CM

## 2013-12-19 DIAGNOSIS — M79609 Pain in unspecified limb: Secondary | ICD-10-CM | POA: Insufficient documentation

## 2013-12-19 DIAGNOSIS — Z9889 Other specified postprocedural states: Secondary | ICD-10-CM

## 2013-12-19 DIAGNOSIS — E785 Hyperlipidemia, unspecified: Secondary | ICD-10-CM

## 2013-12-19 DIAGNOSIS — K2270 Barrett's esophagus without dysplasia: Secondary | ICD-10-CM

## 2013-12-19 DIAGNOSIS — M899 Disorder of bone, unspecified: Secondary | ICD-10-CM

## 2013-12-19 DIAGNOSIS — G8929 Other chronic pain: Secondary | ICD-10-CM

## 2013-12-19 DIAGNOSIS — M549 Dorsalgia, unspecified: Secondary | ICD-10-CM

## 2013-12-19 DIAGNOSIS — M542 Cervicalgia: Secondary | ICD-10-CM

## 2013-12-19 DIAGNOSIS — M8580 Other specified disorders of bone density and structure, unspecified site: Secondary | ICD-10-CM

## 2013-12-19 NOTE — Progress Notes (Signed)
Subjective:       Patient ID: Michele [name] is a 73 y.o. female.    Leg Pain   There was no injury mechanism. The pain is present in the left leg.       Smoking: 10 cig/day for 50 years     . Barrett esophagus      Follow up by Dr Gwenlyn Perking , has no dysplasia complains of heart burn, better on Prilosec 20 mg        . S/P colonoscopic polypectomy      In 2009 shows tubulovillous polyps     . Chronic back pain      MRI lumbar spine in 2012 degenerative changes involving the lumbosacral spine. Right lateral disc protrusion at the L3-L4 level associated with right L3 nerve root impingement. 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 Annular tears involving the L4-L5 and L5-S1 intervertebral discs.(2012), stable  not taking  Pain medications      . Osteopenia      Taking Calcium and vit D      . Neck pain      MRI cervical spine in 2011 shows Multilevel degenerative changes of the cervical spine resulting in marked, central canal and bilateral neural foraminal stenoses at multiple levels.       . Thrombocytopenia..  Lab Results   Component Value Date    WBC 4.10 10/26/2013    HGB 13.1 10/26/2013    HCT 41.9 10/26/2013    MCV 94.6 10/26/2013    PLT 146 10/26/2013        family history disease : father died at 30 yeras ,  Stress test 2011 normal  Social; house cleaning, married, 4 chidren, from [place]  The following portions of the patient's history were reviewed  The following portions of the patient's history were reviewed and updated as appropriate:   She  has a past medical history of Gastroesophageal reflux disease; Barrett's esophagus; Abnormal EKG; Arthralgia; Cervical spinal stenosis; Osteopenia; Depression; Colonic polyp; and Osteoarthritis.  She  does not have any pertinent problems on file.  She  has past surgical history that includes Appendectomy.  Her family history is negative for Breast cancer.  She  reports that she has been smoking Cigarettes.  She has a 25 pack-year smoking  history. She has never used smokeless tobacco. She reports that she does not drink alcohol or use illicit drugs.  She has a current medication list which includes the following prescription(s): albuterol, cholecalciferol, cyanocobalamin, multivitamin, and omeprazole.  Current Outpatient Prescriptions on File Prior to Visit   Medication Sig Dispense Refill   . albuterol (PROAIR HFA) 108 (90 BASE) MCG/ACT inhaler Inhale 2 puffs into the lungs every 6 (six) hours as needed for Wheezing.  1 Inhaler  0   . Cholecalciferol (VITAMIN D PO) Take by mouth.       . Cyanocobalamin (VITAMIN B-12 PO) Take by mouth.       . Multiple Vitamin (MULTIVITAMIN) tablet Take 1 tablet by mouth daily.       Marland Kitchen omeprazole (PRILOSEC) 20 MG capsule Take 1 capsule (20 mg total) by mouth daily.  90 capsule  3     No current facility-administered medications on file prior to visit.     She has No Known Allergies..    Review of Systems   Cardiovascular: Negative for chest pain and palpitations.   Musculoskeletal: Positive for arthralgias.  Objective:    Physical Exam   Constitutional: She appears well-developed and well-nourished. No distress.   HENT:   Head: Normocephalic and atraumatic.   Right Ear: External ear normal.   Left Ear: External ear normal.   Nose: Nose normal.   Mouth/Throat: Oropharynx is clear and moist. No oropharyngeal exudate.   Cardiovascular: Normal rate, regular rhythm and normal heart sounds.  Exam reveals no gallop and no friction rub.    No murmur heard.  Pulmonary/Chest: No respiratory distress. She has no wheezes. She has no rales. She exhibits no tenderness.   Abdominal: She exhibits no distension and no mass. There is no tenderness. There is no rebound and no guarding.   Skin: She is not diaphoretic.           Assessment/plan :       1. Barrett esophagus  Gi follow up    2. Chronic back pain  stable   3. Neck pain  stable   4. Osteopenia  Vit d and Calcium    5. S/P colonoscopic polypectomy  Follow up with GI     6. Thrombocytopenia  Hematology   7. Hyperlipidemia  CBC and differential    Lipid panel   8. Leg pain: arterial US and knee Xary bilaterallly , follo wup in 2 weeks                 Procedures

## 2013-12-20 ENCOUNTER — Ambulatory Visit
Admission: RE | Admit: 2013-12-20 | Discharge: 2013-12-20 | Disposition: A | Discharge status: Home / Self Care | Payer: Medicare Other | Source: Ambulatory Visit | Attending: Family Medicine | Admitting: Family Medicine

## 2013-12-20 DIAGNOSIS — E785 Hyperlipidemia, unspecified: Secondary | ICD-10-CM | POA: Insufficient documentation

## 2013-12-20 LAB — LIPID PANEL
Cholesterol / HDL Ratio: 2.9
Cholesterol: 194 mg/dL (ref 0–199)
HDL: 68 mg/dL (ref >40)
LDL Calculated: 116 mg/dL — ABNORMAL HIGH (ref 0–99)
Triglycerides: 52 mg/dL (ref 34–149)
VLDL Calculated: 10 mg/dL (ref 10–40)

## 2013-12-20 LAB — CBC AND DIFFERENTIAL
Basophils Absolute Automated: 0.02 10*3/uL (ref 0.00–0.20)
Basophils Automated: 0 %
Eosinophils Absolute Automated: 0.06 10*3/uL (ref 0.00–0.70)
Eosinophils Automated: 1 %
Hematocrit: 39.5 % (ref 37.0–47.0)
Hgb: 13.2 g/dL (ref 12.0–16.0)
Immature Granulocytes Absolute: 0 10*3/uL
Immature Granulocytes: 0 %
Lymphocytes Absolute Automated: 1.51 10*3/uL (ref 0.50–4.40)
Lymphocytes Automated: 36 %
MCH: 30.6 pg (ref 28.0–32.0)
MCHC: 33.4 g/dL (ref 32.0–36.0)
MCV: 91.4 fL (ref 80.0–100.0)
MPV: 12.6 fL — ABNORMAL HIGH (ref 9.4–12.3)
Monocytes Absolute Automated: 0.34 10*3/uL (ref 0.00–1.20)
Monocytes: 8 %
Neutrophils Absolute: 2.24 10*3/uL (ref 1.80–8.10)
Neutrophils: 54 %
Nucleated RBC: 0 /100 WBC (ref 0–1)
Platelets: 132 10*3/uL — ABNORMAL LOW (ref 140–400)
RBC: 4.32 10*6/uL (ref 4.20–5.40)
RDW: 13 % (ref 12–15)
WBC: 4.17 10*3/uL (ref 3.50–10.80)

## 2013-12-20 LAB — HEMOLYSIS INDEX: Hemolysis Index: 12 (ref 0–18)

## 2014-01-23 ENCOUNTER — Ambulatory Visit: Payer: Medicare Other

## 2014-01-23 ENCOUNTER — Telehealth (INDEPENDENT_AMBULATORY_CARE_PROVIDER_SITE_OTHER): Payer: Self-pay | Admitting: Family Medicine

## 2014-01-23 NOTE — Telephone Encounter (Signed)
Message left on nurse voicemail: Pt returned your call. Please call pt back.

## 2014-01-24 ENCOUNTER — Ambulatory Visit: Payer: Medicare Other

## 2014-01-27 ENCOUNTER — Encounter (INDEPENDENT_AMBULATORY_CARE_PROVIDER_SITE_OTHER): Payer: Self-pay | Admitting: Family Medicine

## 2014-01-27 ENCOUNTER — Ambulatory Visit (INDEPENDENT_AMBULATORY_CARE_PROVIDER_SITE_OTHER): Payer: Medicare Other | Admitting: Family Medicine

## 2014-01-27 ENCOUNTER — Ambulatory Visit
Admission: RE | Admit: 2014-01-27 | Discharge: 2014-01-27 | Disposition: A | Discharge status: Home / Self Care | Payer: Medicare Other | Source: Ambulatory Visit | Attending: Family Medicine | Admitting: Family Medicine

## 2014-01-27 VITALS — BP 100/58 | HR 70 | Temp 98.0°F | Ht <= 58 in | Wt 115.0 lb

## 2014-01-27 DIAGNOSIS — M542 Cervicalgia: Secondary | ICD-10-CM

## 2014-01-27 DIAGNOSIS — I700 Atherosclerosis of aorta: Secondary | ICD-10-CM | POA: Insufficient documentation

## 2014-01-27 DIAGNOSIS — K449 Diaphragmatic hernia without obstruction or gangrene: Secondary | ICD-10-CM | POA: Insufficient documentation

## 2014-01-27 DIAGNOSIS — Z9889 Other specified postprocedural states: Secondary | ICD-10-CM

## 2014-01-27 DIAGNOSIS — K7689 Other specified diseases of liver: Secondary | ICD-10-CM | POA: Insufficient documentation

## 2014-01-27 DIAGNOSIS — M949 Disorder of cartilage, unspecified: Secondary | ICD-10-CM

## 2014-01-27 DIAGNOSIS — D696 Thrombocytopenia, unspecified: Secondary | ICD-10-CM

## 2014-01-27 DIAGNOSIS — K2270 Barrett's esophagus without dysplasia: Secondary | ICD-10-CM

## 2014-01-27 DIAGNOSIS — J984 Other disorders of lung: Secondary | ICD-10-CM | POA: Insufficient documentation

## 2014-01-27 DIAGNOSIS — M899 Disorder of bone, unspecified: Secondary | ICD-10-CM

## 2014-01-27 DIAGNOSIS — M549 Dorsalgia, unspecified: Secondary | ICD-10-CM

## 2014-01-27 DIAGNOSIS — I779 Disorder of arteries and arterioles, unspecified: Secondary | ICD-10-CM | POA: Insufficient documentation

## 2014-01-27 DIAGNOSIS — R9389 Abnormal findings on diagnostic imaging of other specified body structures: Secondary | ICD-10-CM

## 2014-01-27 DIAGNOSIS — G8929 Other chronic pain: Secondary | ICD-10-CM

## 2014-01-27 DIAGNOSIS — M8580 Other specified disorders of bone density and structure, unspecified site: Secondary | ICD-10-CM

## 2014-01-27 DIAGNOSIS — D1809 Hemangioma of other sites: Secondary | ICD-10-CM | POA: Insufficient documentation

## 2014-01-27 LAB — POCT CREATININE STAT SENSOR (AH)
Creatinine POCT: 0.53 mg/dL (ref ?–1.2)
GFR POCT: >50 mL/min/{1.73_m2} (ref 60–?)
Reference Range: NORMAL
Reference Range: NORMAL

## 2014-01-27 LAB — CBC AND DIFFERENTIAL
Basophils Absolute Automated: 0.02 10*3/uL (ref 0.00–0.20)
Basophils Automated: 0 %
Eosinophils Absolute Automated: 0.1 10*3/uL (ref 0.00–0.70)
Eosinophils Automated: 2 %
Hematocrit: 38.3 % (ref 37.0–47.0)
Hgb: 12.7 g/dL (ref 12.0–16.0)
Immature Granulocytes Absolute: 0 10*3/uL
Immature Granulocytes: 0 %
Lymphocytes Absolute Automated: 1.72 10*3/uL (ref 0.50–4.40)
Lymphocytes Automated: 33 %
MCH: 30.9 pg (ref 28.0–32.0)
MCHC: 33.2 g/dL (ref 32.0–36.0)
MCV: 93.2 fL (ref 80.0–100.0)
MPV: 12.6 fL — ABNORMAL HIGH (ref 9.4–12.3)
Monocytes Absolute Automated: 0.65 10*3/uL (ref 0.00–1.20)
Monocytes: 13 %
Neutrophils Absolute: 2.67 10*3/uL (ref 1.80–8.10)
Neutrophils: 52 %
Nucleated RBC: 0 /100 WBC (ref 0–1)
Platelets: 133 10*3/uL — ABNORMAL LOW (ref 140–400)
RBC: 4.11 10*6/uL — ABNORMAL LOW (ref 4.20–5.40)
RDW: 13 % (ref 12–15)
WBC: 5.16 10*3/uL (ref 3.50–10.80)

## 2014-01-27 MED ORDER — IOHEXOL 350 MG/ML IV SOLN
100.0000 mL | Freq: Once | INTRAVENOUS | Status: AC | PRN
Start: 2014-01-27 — End: 2014-01-27
  Administered 2014-01-27: 100 mL via INTRAVENOUS

## 2014-01-27 NOTE — Progress Notes (Signed)
Subjective:       Patient ID: Michele [name] is a 73 y.o. female.    HPI    Abnormal chest Xray : on 9/ 14 Small left posterior pleural effusion versus pleural  thickening. Clear lungs.  . Barrett esophagus      Follow up by Dr Gwenlyn Perking , has no dysplasia complains of heart burn, better on Prilosec 20 mg        . 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 Chronic back pain: better       MRI lumbar spine in 2012 degenerative changes involving the lumbosacral spine. Right lateral disc protrusion at the L3-L4 level associated with right L3 nerve root impingement. 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 Annular tears involving the L4-L5 and L5-S1 intervertebral discs.(2012), stable  not taking  Pain medications      . Osteopenia      Taking Calcium and vit D      . Neck pain: better      MRI cervical spine in 2011 shows Multilevel degenerative changes of the cervical spine resulting in marked, central canal and bilateral neural foraminal stenoses at multiple levels.       . Thrombocytopenia..  Lab Results   Component Value Date    WBC 4.17 12/20/2013    HGB 13.2 12/20/2013    HCT 39.5 12/20/2013    MCV 91.4 12/20/2013    PLT 132* 12/20/2013        family history disease : father died at 39 years ,    Social; house cleaning, married, 4 chidren, from [place]  The following portions of the patient's history were reviewed  The following portions of the patient's history were reviewed and updated as appropriate:   She  has a past medical history of Gastroesophageal reflux disease; Barrett's esophagus; Abnormal EKG; Arthralgia; Cervical spinal stenosis; Osteopenia; Depression; Colonic polyp; and Osteoarthritis.  She  does not have any pertinent problems on file.  She  has past surgical history that includes Appendectomy.  Her family history is negative for Breast cancer.  She  reports that she has been smoking Cigarettes.  She has a 25  pack-year smoking history. She has never used smokeless tobacco. She reports that she does not drink alcohol or use illicit drugs.  She has a current medication list which includes the following prescription(s): albuterol, cholecalciferol, cyanocobalamin, multivitamin, and omeprazole.  Current Outpatient Prescriptions on File Prior to Visit   Medication Sig Dispense Refill   . albuterol (PROAIR HFA) 108 (90 BASE) MCG/ACT inhaler Inhale 2 puffs into the lungs every 6 (six) hours as needed for Wheezing. 1 Inhaler 0   . Cholecalciferol (VITAMIN D PO) Take by mouth.     . Cyanocobalamin (VITAMIN B-12 PO) Take by mouth.     . Multiple Vitamin (MULTIVITAMIN) tablet Take 1 tablet by mouth daily.     Marland Kitchen omeprazole (PRILOSEC) 20 MG capsule Take 1 capsule (20 mg total) by mouth daily. 90 capsule 3     No current facility-administered medications on file prior to visit.     She has No Known Allergies..    Review of Systems   Cardiovascular: Negative for chest pain and palpitations.   Musculoskeletal: Positive for arthralgias.           Objective:    Physical Exam   Constitutional: She appears well-developed  and well-nourished. No distress.   HENT:   Head: Normocephalic and atraumatic.   Right Ear: External ear normal.   Left Ear: External ear normal.   Nose: Nose normal.   Mouth/Throat: Oropharynx is clear and moist. No oropharyngeal exudate.   Cardiovascular: Normal rate, regular rhythm and normal heart sounds.  Exam reveals no gallop and no friction rub.    No murmur heard.  Pulmonary/Chest: No respiratory distress. She has no wheezes. She has no rales. She exhibits no tenderness.   Abdominal: She exhibits no distension and no mass. There is no tenderness. There is no rebound and no guarding.   Skin: She is not diaphoretic.           Assessment/plan :       1. Barrett esophagus  Gi follow up    2. Chronic back pain  stable   3. Neck pain  stable   4. Osteopenia  Vit d and Calcium    5. S/P colonoscopic polypectomy  Follow up  with GI    6. Thrombocytopenia  Did not follow up with hematology , repeat cbc    7. Abnormal chest Ray: Ct chest                 Procedures

## 2014-01-30 ENCOUNTER — Ambulatory Visit: Payer: Medicare Other

## 2014-03-21 ENCOUNTER — Other Ambulatory Visit (INDEPENDENT_AMBULATORY_CARE_PROVIDER_SITE_OTHER): Payer: Self-pay | Admitting: Family Medicine

## 2014-03-21 DIAGNOSIS — E785 Hyperlipidemia, unspecified: Secondary | ICD-10-CM

## 2014-03-23 ENCOUNTER — Ambulatory Visit (INDEPENDENT_AMBULATORY_CARE_PROVIDER_SITE_OTHER): Payer: Medicare Other | Admitting: Family Medicine

## 2014-03-23 ENCOUNTER — Encounter (INDEPENDENT_AMBULATORY_CARE_PROVIDER_SITE_OTHER): Payer: Self-pay | Admitting: Family Medicine

## 2014-03-23 VITALS — BP 109/65 | HR 66 | Temp 98.2°F | Ht 61.0 in | Wt 113.0 lb

## 2014-03-23 DIAGNOSIS — M8580 Other specified disorders of bone density and structure, unspecified site: Secondary | ICD-10-CM

## 2014-03-23 DIAGNOSIS — M25529 Pain in unspecified elbow: Secondary | ICD-10-CM | POA: Insufficient documentation

## 2014-03-23 DIAGNOSIS — M542 Cervicalgia: Secondary | ICD-10-CM

## 2014-03-23 DIAGNOSIS — M549 Dorsalgia, unspecified: Secondary | ICD-10-CM

## 2014-03-23 DIAGNOSIS — M949 Disorder of cartilage, unspecified: Secondary | ICD-10-CM

## 2014-03-23 DIAGNOSIS — R29898 Other symptoms and signs involving the musculoskeletal system: Secondary | ICD-10-CM

## 2014-03-23 DIAGNOSIS — K2270 Barrett's esophagus without dysplasia: Secondary | ICD-10-CM

## 2014-03-23 DIAGNOSIS — D696 Thrombocytopenia, unspecified: Secondary | ICD-10-CM

## 2014-03-23 DIAGNOSIS — Z1239 Encounter for other screening for malignant neoplasm of breast: Secondary | ICD-10-CM

## 2014-03-23 DIAGNOSIS — M545 Low back pain, unspecified: Secondary | ICD-10-CM

## 2014-03-23 DIAGNOSIS — G8929 Other chronic pain: Secondary | ICD-10-CM

## 2014-03-23 NOTE — Progress Notes (Signed)
Subjective:       Patient ID: Michele [name] is a 73 y.o. female.    HPI   chronic leg weakness, present for  A  few years, weakness when satnding up , was seen by Michele [name] , was given medication and did not take medication, not better     Elbow pain: for  A long time, shooting pain off and on   Abnormal chest Xray : CT chest  Minimal interstitial scarring at the left lung base. No  infiltrate or mass. No pleural effusion.Mild prominence of the ascending aorta likely due to underlying  hypertension. Aortic calcification. Decreased attenuation in the aorta suggest the possibility of anemia.  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  Small hiatal hernia.    . Barrett esophagus      Follow up by Michele [name] , has no dysplasia complains of heart burn, better on Prilosec 20 mg        . S/P colonoscopic polypectomy      In 2009 shows tubulovillous polyps, follo wup by Michele [name]      . Chronic back pain:       MRI lumbar spine in 2012 degenerative changes involving the lumbosacral spine. Right lateral disc protrusion at the L3-L4 level associated with right L3 nerve root impingement. 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 Annular tears involving the L4-L5 and L5-S1 intervertebral discs.(2012), stable  not taking  Pain medications      . Osteopenia      Taking Calcium and vit D      . Neck pain:       MRI cervical spine in 2011 shows Multilevel degenerative changes of the cervical spine resulting in marked, central canal and bilateral neural foraminal stenoses at multiple levels.       . Thrombocytopenia.. : stable   Lab Results   Component Value Date    WBC 5.16 01/27/2014    HGB 12.7 01/27/2014    HCT 38.3 01/27/2014    MCV 93.2 01/27/2014    PLT 133* 01/27/2014        family history disease : father died at 51 years ,    Social; house cleaning, married, 4 chidren, from [place]  The following portions of the patient's history were reviewed  The  following portions of the patient's history were reviewed and updated as appropriate:   She  has a past medical history of Gastroesophageal reflux disease; Barrett's esophagus; Abnormal EKG; Arthralgia; Cervical spinal stenosis; Osteopenia; Depression; Colonic polyp; and Osteoarthritis.  She  does not have any pertinent problems on file.  She  has past surgical history that includes Appendectomy.  Her family history is negative for Breast cancer.  She  reports that she has been smoking Cigarettes.  She has a 25 pack-year smoking history. She has never used smokeless tobacco. She reports that she does not drink alcohol or use illicit drugs.  She has a current medication list which includes the following prescription(s): albuterol, cholecalciferol, cyanocobalamin, multivitamin, and omeprazole.  Current Outpatient Prescriptions on File Prior to Visit   Medication Sig Dispense Refill   . albuterol (PROAIR HFA) 108 (90 BASE) MCG/ACT inhaler Inhale 2 puffs into the lungs every 6 (six) hours as needed for Wheezing. 1 Inhaler 0   . Cholecalciferol (VITAMIN D PO) Take by mouth.     . Cyanocobalamin (VITAMIN B-12 PO) Take by mouth.     Marland Kitchen  Multiple Vitamin (MULTIVITAMIN) tablet Take 1 tablet by mouth daily.     Marland Kitchen omeprazole (PRILOSEC) 20 MG capsule Take 1 capsule (20 mg total) by mouth daily. 90 capsule 3     No current facility-administered medications on file prior to visit.     She has No Known Allergies..    Review of Systems   Cardiovascular: Negative for palpitations.   Musculoskeletal: Positive for arthralgias.           Objective:    Physical Exam   Constitutional: She appears well-developed and well-nourished. No distress.   HENT:   Head: Normocephalic and atraumatic.   Right Ear: External ear normal.   Left Ear: External ear normal.   Nose: Nose normal.   Mouth/Throat: Oropharynx is clear and moist. No oropharyngeal exudate.   Cardiovascular: Normal rate, regular rhythm and normal heart sounds.  Exam reveals no gallop  and no friction rub.    No murmur heard.  Pulmonary/Chest: No respiratory distress. She has no wheezes. She has no rales. She exhibits no tenderness.   Abdominal: She exhibits no distension and no mass. There is no tenderness. There is no rebound and no guarding.   Skin: She is not diaphoretic.           Assessment/plan :       1. Barrett esophagus  Gi follow up    2. Chronic back pain  Motrin 200 mg 2 tablets as needed    3. Neck pain  stable   4. Osteopenia  Vit d and Calcium    5. S/P colonoscopic polypectomy  Follow up with GI    6. Thrombocytopenia  Stable ,  followed up with hematology      7. Weakness of lower extremity, unspecified laterality  Ambulatory referral to Neurology       8 elbow pain : Motrin 200 mg 2 tablets Q6 hrs           Procedures

## 2014-03-31 ENCOUNTER — Ambulatory Visit
Admission: RE | Admit: 2014-03-31 | Discharge: 2014-03-31 | Disposition: A | Discharge status: Home / Self Care | Payer: Medicare Other | Source: Ambulatory Visit | Attending: Family Medicine | Admitting: Family Medicine

## 2014-03-31 DIAGNOSIS — M47817 Spondylosis without myelopathy or radiculopathy, lumbosacral region: Secondary | ICD-10-CM | POA: Insufficient documentation

## 2014-04-04 ENCOUNTER — Other Ambulatory Visit (INDEPENDENT_AMBULATORY_CARE_PROVIDER_SITE_OTHER): Payer: Self-pay | Admitting: Family Medicine

## 2014-04-07 ENCOUNTER — Telehealth (INDEPENDENT_AMBULATORY_CARE_PROVIDER_SITE_OTHER): Payer: Self-pay | Admitting: Family Medicine

## 2014-04-07 NOTE — Telephone Encounter (Signed)
Patient came in to the office, she got a copy of the MRI results and she made an appointment for 04/24/2014

## 2014-04-17 ENCOUNTER — Ambulatory Visit
Admission: RE | Admit: 2014-04-17 | Discharge: 2014-04-17 | Disposition: A | Discharge status: Home / Self Care | Payer: Medicare Other | Source: Ambulatory Visit | Attending: Family Medicine | Admitting: Family Medicine

## 2014-04-17 ENCOUNTER — Other Ambulatory Visit (INDEPENDENT_AMBULATORY_CARE_PROVIDER_SITE_OTHER): Payer: Self-pay | Admitting: Family Medicine

## 2014-04-17 DIAGNOSIS — Z1231 Encounter for screening mammogram for malignant neoplasm of breast: Secondary | ICD-10-CM | POA: Insufficient documentation

## 2014-04-17 DIAGNOSIS — Z139 Encounter for screening, unspecified: Secondary | ICD-10-CM

## 2014-04-24 ENCOUNTER — Encounter (INDEPENDENT_AMBULATORY_CARE_PROVIDER_SITE_OTHER): Payer: Self-pay | Admitting: Family Medicine

## 2014-04-24 ENCOUNTER — Ambulatory Visit (INDEPENDENT_AMBULATORY_CARE_PROVIDER_SITE_OTHER): Payer: Medicare Other | Admitting: Family Medicine

## 2014-04-24 VITALS — BP 121/64 | HR 75 | Temp 99.4°F

## 2014-04-24 DIAGNOSIS — R251 Tremor, unspecified: Secondary | ICD-10-CM | POA: Insufficient documentation

## 2014-04-24 DIAGNOSIS — G8929 Other chronic pain: Secondary | ICD-10-CM

## 2014-04-24 DIAGNOSIS — M549 Dorsalgia, unspecified: Secondary | ICD-10-CM

## 2014-04-24 NOTE — Progress Notes (Addendum)
Subjective:       Patient ID: Michele [name] is a 73 y.o. female.    HPI     chronic back /leg weakness, present for  A  few years, weakness, shakiness of both legs  when standing up , was seen , not better , saw Dr Abelardo Diesel , (no consultation in the chart.  MRI There is mild levoscoliotic deformity. The vertebral bodies are normal  in height and configuration. There are no findings to suggest an acute  fractures. L1-L2: There is minimal disc bulge but no significant compromise of the  central canal or neuroforamina.L2-L3: There is disc degeneration with disc height preservation. There  is diffuse disc bulge with bilateral facet joint hypertrophic changesresulting in moderate degree of central canal and bilateral foramina stenosis, stable. There is small, left foraminal annular tear.L3-L4: There is disc degeneration with disc height loss, stable. There is broad-based posterior osteophyte disc bulge complex with bilateral  facet joint hypertrophic changes resulting in moderate to severe central canal and bilateral foramina stenosis, stable.  L4-L5: There is disc degeneration with disc height preservation. There is diffuse disc bulge, ligamentum flavum thickening and bilateral facetjoint hypertrophic changes resulting in severe central canal and bilateral foramina stenosis, stable. There is left foraminal annular tear.L5-S1: There is disc degeneration with disc height preservation. There is minimal disc bulge with bilateral facet joint hypertrophic changes resulting in mild bilateral neuroforamina stenosis with small, right  foraminal annular tear, stable.The conus tip is seen at L1-L2, and has normal morphology and signal  intensity. There are no paraspinal masses or collections      . Barrett esophagus      Follow up by Dr Gwenlyn Perking , has no dysplasia complains of heart burn, better on Prilosec 20 mg        . 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          Marland Kitchen  Osteopenia      Taking Calcium and vit D      . Neck pain:       MRI cervical spine in 2011 shows Multilevel degenerative changes of the cervical spine resulting in marked, central canal and bilateral neural foraminal stenoses at multiple levels.       . Thrombocytopenia.. : stable   Lab Results   Component Value Date    WBC 5.16 01/27/2014    HGB 12.7 01/27/2014    HCT 38.3 01/27/2014    MCV 93.2 01/27/2014    PLT 133* 01/27/2014        family history disease : father died at 81 years ,    Social; house cleaning, married, 4 chidren, from [place]  The following portions of the patient's history were reviewed  The following portions of the patient's history were reviewed and updated as appropriate:   She  has a past medical history of Gastroesophageal reflux disease; Barrett's esophagus; Abnormal EKG; Arthralgia; Cervical spinal stenosis; Osteopenia; Depression; Colonic polyp; and Osteoarthritis.  She  does not have any pertinent problems on file.  She  has past surgical history that includes Appendectomy.  Her family history is negative for Breast cancer.  She  reports that she has been smoking Cigarettes.  She has a 25 pack-year smoking history. She has never used smokeless tobacco. She reports that she does not drink alcohol or use illicit drugs.  She has a current medication list which includes the  following prescription(s): cholecalciferol, cyanocobalamin, multivitamin, and omeprazole.  Current Outpatient Prescriptions on File Prior to Visit   Medication Sig Dispense Refill   . Cholecalciferol (VITAMIN D PO) Take by mouth.     . Cyanocobalamin (VITAMIN B-12 PO) Take by mouth.     . Multiple Vitamin (MULTIVITAMIN) tablet Take 1 tablet by mouth daily.     Marland Kitchen omeprazole (PRILOSEC) 20 MG capsule TAKE 1 CAPSULE (20 MG TOTAL) BY MOUTH DAILY. 90 capsule 3     No current facility-administered medications on file prior to visit.     She has No Known Allergies..    Review of Systems   Cardiovascular: Negative for  palpitations.   Musculoskeletal: Positive for arthralgias.           Objective:    Physical Exam   Constitutional: She appears well-developed and well-nourished.   HENT:   Head: Normocephalic and atraumatic.   Cardiovascular: Normal rate, regular rhythm and normal heart sounds.  Exam reveals no gallop and no friction rub.    No murmur heard.  Pulmonary/Chest: No respiratory distress. She has no wheezes. She has no rales. She exhibits no tenderness.           Assessment/plan :       1. Weakness of lower extremity, unspecified laterality /shaking movement with changing position  Back surgery ,  physical therapy      2 Barrett esophagus  Gi follow up    3 Chronic back pain  Back surgery    4 Neck pain  stable   5 Osteopenia  Vit d and Calcium    6 S/P colonoscopic polypectomy  Follow up with GI    7 Thrombocytopenia  Stable ,  followed up with hematology  Pending                   Procedures

## 2014-05-08 NOTE — Progress Notes (Signed)
Outpatient Progress Note    Patient Name: Michele [name],Michele [name]    Assessment:     1. Thrombocytopenia  POCT AMB CBC W/ AUTO DIFF        Plan:   The patient has immune thrombocytopenia.  This can be observed at this time and does not need any intervention.    There are no Patient Instructions on file for this visit.  Return in about 3 months (around 08/09/2014).        Past Hematologic/Oncologic Hx: A 73 year old [place] female presents with asymptomatic mild  thrombocytopenia.  Previous platelet counts are as below.  We also measured  a platelet count here today which measured at 125.  She does not take any  medications other than proton pump inhibitors.  She does not take any  over-the-counter medication.  No history of bleeding or bruising.  No  history of blood transfusions.  No history of titers B or C.  She otherwise  feels terrific, exercises regularly, and is eating well.        Component      Latest Ref Rng 02/21/2013 04/04/2013   Platelet Count      140 - 400 Thousand/uL 128 (L) 133 (L)     plts 125 on testing here.  Hep B/C, ANA/RF neg.    Other PMH:  Past Medical History   Diagnosis Date   . Gastroesophageal reflux disease    . Barrett's esophagus    . Abnormal EKG    . Arthralgia    . Cervical spinal stenosis    . Osteopenia    . Depression    . Colonic polyp    . Osteoarthritis      Past Surgical History   Procedure Laterality Date   . Appendectomy       when was a teenager     The following portions of the patient's history were reviewed and updated as appropriate: allergies, current medications, past family history, past medical history, past social history, past surgical history and problem list.    Subjective:   She feels well and denies any complaints at this time.        Outpatient Medications:     Current Outpatient Prescriptions   Medication Sig Dispense Refill   . Cholecalciferol (VITAMIN D PO) Take by mouth.     . Cyanocobalamin (VITAMIN B-12 PO) Take by mouth.     . Multiple Vitamin (MULTIVITAMIN) tablet  Take 1 tablet by mouth daily.     Marland Kitchen omeprazole (PRILOSEC) 20 MG capsule TAKE 1 CAPSULE (20 MG TOTAL) BY MOUTH DAILY. 90 capsule 3     No current facility-administered medications for this visit.         Review of Systems:   All other systems were reviewed and were negative:     Physical Exam:   There were no vitals taken for this visit.    General  AAOx3, NAD  HEENT Anicteric  Abdomen NT/ND, no HSM  Extremities no C/C/E  Skin no bruising  LNs negative cervical supraclavicular     Results     Procedure Component Value Units Date/Time    POCT AMB CBC W/ AUTO DIFF [[id number]] Collected:  05/09/14 0925    Specimen Information:  Blood Updated:  05/09/14 0928     WBC 4.6      Lymphocytes Automated 31.3      Monocytes 5.3      Grans 63.4      Lymphs Abs 1.4  Monos Abs 0.2      Granulocytes Abs 2.9      RBC 4.32      Hgb POCT 13.1 g/dL      Hematocrit 16.1      MCV 91.4      MCH, POC 30.4      MCHC 33.3      RDW-CV 13.9      Platelet Count, POC 111      MPV 8.7

## 2014-05-09 ENCOUNTER — Ambulatory Visit (INDEPENDENT_AMBULATORY_CARE_PROVIDER_SITE_OTHER): Payer: Medicare Other | Admitting: Hematology & Oncology

## 2014-05-09 DIAGNOSIS — D696 Thrombocytopenia, unspecified: Secondary | ICD-10-CM

## 2014-05-09 DIAGNOSIS — D693 Immune thrombocytopenic purpura: Secondary | ICD-10-CM

## 2014-05-09 LAB — POCT AMB CBC W/ AUTO DIFF
Granulocytes Abs: 2.9
Granulocytes: 63.4
Hematocrit POCT: 39.5
Hgb POCT: 13.1 g/dL (ref 12–15.5)
Lymphocytes Absolute POCT: 1.4
Lymphocytes Automated: 31.3
MCH, POC: 30.4
MCHC: 33.3
MCV: 91.4
MPV: 8.7
Monocytes Absolute POCT: 0.2
Monocytes: 5.3
Platelet Count POCT: 111
RBC: 4.32
RDW POCT: 13.9
WBC: 4.6

## 2014-05-30 ENCOUNTER — Ambulatory Visit (INDEPENDENT_AMBULATORY_CARE_PROVIDER_SITE_OTHER): Payer: Medicare Other | Admitting: Student in an Organized Health Care Education/Training Program

## 2014-05-31 ENCOUNTER — Ambulatory Visit: Payer: Medicare Other

## 2014-06-06 ENCOUNTER — Ambulatory Visit (INDEPENDENT_AMBULATORY_CARE_PROVIDER_SITE_OTHER): Payer: Medicare Other | Admitting: Family Medicine

## 2014-06-06 VITALS — BP 107/59 | HR 81 | Temp 98.1°F | Ht 60.0 in | Wt 115.0 lb

## 2014-06-06 DIAGNOSIS — M8580 Other specified disorders of bone density and structure, unspecified site: Secondary | ICD-10-CM

## 2014-06-06 DIAGNOSIS — M545 Low back pain, unspecified: Secondary | ICD-10-CM

## 2014-06-06 DIAGNOSIS — M549 Dorsalgia, unspecified: Secondary | ICD-10-CM

## 2014-06-06 DIAGNOSIS — N644 Mastodynia: Secondary | ICD-10-CM

## 2014-06-06 DIAGNOSIS — G8929 Other chronic pain: Secondary | ICD-10-CM

## 2014-06-06 DIAGNOSIS — K2270 Barrett's esophagus without dysplasia: Secondary | ICD-10-CM

## 2014-06-06 DIAGNOSIS — M542 Cervicalgia: Secondary | ICD-10-CM

## 2014-06-06 NOTE — Progress Notes (Signed)
Subjective:       Patient ID: Michele [name] is a 73 y.o. female.    HPI       Breast pain: last mammogram was normal , tender on palpation , has tried nothing for pain, intermittent affecting both breasts , no new lumps    chronic back /leg weakness, present for  A  few years, weakness, shakiness of both legs  when standing up , was seen , not better , She did not follow up with Back surgeon and is under the care of homeopathic provider.   MRI There is mild levoscoliotic deformity. The vertebral bodies are normalin height and configuration. There are no findings to suggest an acute fractures. L1-L2: There is minimal disc bulge but no significant compromise of the  central canal or neuroforamina.L2-L3: There is disc degeneration with disc height preservation. There  is diffuse disc bulge with bilateral facet joint hypertrophic changesresulting in moderate degree of central canal and bilateral foramina stenosis, stable. There is small, left foraminal annular tear.L3-L4: There is disc degeneration with disc height loss, stable. There is broad-based posterior osteophyte disc bulge complex with bilateral  facet joint hypertrophic changes resulting in moderate to severe central canal and bilateral foramina stenosis, stable.  L4-L5: There is disc degeneration with disc height preservation. There is diffuse disc bulge, ligamentum flavum thickening and bilateral facetjoint hypertrophic changes resulting in severe central canal and bilateral foramina stenosis, stable. There is left foraminal annular tear.L5-S1: There is disc degeneration with disc height preservation. There is minimal disc bulge with bilateral facet joint hypertrophic changes resulting in mild bilateral neuroforamina stenosis with small, right  foraminal annular tear, stable.The conus tip is seen at L1-L2, and has normal morphology and signal  intensity. There are no paraspinal masses or collections      . Barrett esophagus      Follow up by Dr Gwenlyn Perking , has  no dysplasia complains of heart burn, better on Prilosec 20 mg        . 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          Marland Kitchen Osteopenia      Taking Calcium and vit D      . Neck pain:  Stable       MRI cervical spine in 2011 shows Multilevel degenerative changes of the cervical spine resulting in marked, central canal and bilateral neural foraminal stenoses at multiple levels.       . Thrombocytopenia.. : stable   Lab Results   Component Value Date    WBC 4.6 05/09/2014    HGB 13.1 05/09/2014    HCT 39.5 05/09/2014    MCV 91.4 05/09/2014    PLT 133* 01/27/2014        family history disease : father died at 63 years ,    Social; house cleaning, married, 4 chidren, from [place]  The following portions of the patient's history were reviewed  The following portions of the patient's history were reviewed and updated as appropriate:   She  has a past medical history of Gastroesophageal reflux disease; Barrett's esophagus; Abnormal EKG; Arthralgia; Cervical spinal stenosis; Osteopenia; Depression; Colonic polyp; and Osteoarthritis.  She  does not have any pertinent problems on file.  She  has past surgical history that includes Appendectomy.  Her family history is negative for Breast cancer.  She  reports that she has been smoking Cigarettes.  She has a 25 pack-year smoking history. She has never used smokeless tobacco. She reports that she does not drink alcohol or use illicit drugs.  She has a current medication list which includes the following prescription(s): cholecalciferol, cyanocobalamin, multivitamin, and omeprazole.  Current Outpatient Prescriptions on File Prior to Visit   Medication Sig Dispense Refill   . Cholecalciferol (VITAMIN D PO) Take by mouth.     . Cyanocobalamin (VITAMIN B-12 PO) Take by mouth.     . Multiple Vitamin (MULTIVITAMIN) tablet Take 1 tablet by mouth daily.     Marland Kitchen omeprazole (PRILOSEC) 20 MG capsule TAKE 1 CAPSULE (20 MG TOTAL) BY  MOUTH DAILY. 90 capsule 3     No current facility-administered medications on file prior to visit.     She has No Known Allergies..    Review of Systems   Cardiovascular: Negative for palpitations.   Musculoskeletal: Positive for arthralgias.           Objective:    Physical Exam   Constitutional: She appears well-developed and well-nourished.   HENT:   Head: Normocephalic and atraumatic.   Cardiovascular: Normal rate, regular rhythm and normal heart sounds.  Exam reveals no gallop and no friction rub.    No murmur heard.  Pulmonary/Chest: No respiratory distress. She has no wheezes. She has no rales. She exhibits no tenderness.   breast: no predominant masse,         Assessment/plan :       1. Weakness of lower extremity, unspecified laterality /shaking movement with changing position  Better , [facility]      2 Barrett esophagus  Gi follow up    3 Chronic back pain  Back surgery    4 Neck pain  stable   5 Osteopenia  Vit d and Calcium    6 S/P colonoscopic polypectomy  Follow up with GI    7 Thrombocytopenia  Stable ,  followed up with hematology       8. breast pain : referral to surgery              Procedures

## 2014-06-09 ENCOUNTER — Ambulatory Visit
Admission: RE | Admit: 2014-06-09 | Discharge: 2014-06-09 | Disposition: A | Discharge status: Home / Self Care | Payer: Medicare Other | Source: Ambulatory Visit | Attending: Family Medicine | Admitting: Family Medicine

## 2014-06-09 DIAGNOSIS — M79606 Pain in leg, unspecified: Secondary | ICD-10-CM | POA: Insufficient documentation

## 2014-08-08 ENCOUNTER — Ambulatory Visit
Admission: RE | Admit: 2014-08-08 | Discharge: 2014-08-08 | Disposition: A | Discharge status: Home / Self Care | Payer: Medicare Other | Source: Ambulatory Visit | Attending: Family Medicine | Admitting: Family Medicine

## 2014-08-08 DIAGNOSIS — M8589 Other specified disorders of bone density and structure, multiple sites: Secondary | ICD-10-CM | POA: Insufficient documentation

## 2014-08-08 DIAGNOSIS — M810 Age-related osteoporosis without current pathological fracture: Secondary | ICD-10-CM | POA: Insufficient documentation

## 2014-09-04 ENCOUNTER — Ambulatory Visit: Payer: Medicare Other

## 2014-09-12 ENCOUNTER — Ambulatory Visit: Payer: Medicare Other

## 2014-09-12 NOTE — Progress Notes (Signed)
[facility] Nurse Navigator Note: Talked with patient who states she would like to try conservative treatments for her back pain. States she has scheduled her physical therapy.

## 2014-09-18 ENCOUNTER — Inpatient Hospital Stay
Payer: Medicare Other | Attending: Physical Medicine & Rehabilitation | Admitting: Rehabilitative and Restorative Service Providers"

## 2014-09-18 DIAGNOSIS — M545 Low back pain, unspecified: Secondary | ICD-10-CM | POA: Insufficient documentation
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
## 2014-09-18 NOTE — PT/OT Plan of Care (Signed)
Plan of Care / Updated Plan of Care IPTC Medicare Provider #: [id number]                Patient Name: Michele [name]  MRN: [id number]  [facility] Medicare #: Medicare Sub. Num: [id number] A  DOI: Onset of Problem / Injury: 09/11/14 DOS: NA    SOC: 09/18/2014     Diagnosis:     ICD-10-CM    1. Midline low back pain without sciatica M54.5          G Codes: Evaluation / Current: A2130 Mobility CJ (20-40) Goal: G8979 Mobility CI (1-20)  Primary Functional Deficit: Mobility: Walking and Moving Around G-codes determined by: Oswestry, subj, clinical evaluation    ASSESSMENT: the patient is a 74 y.o. female presenting with LBP secondary to spinal stenosis/neurogenic claudication who requires Physical Therapy for the following:  Impairments: Pain in LB, LE weakness, L LE shaking in standing, poor postural alignment, dec core initiation/strength/endurance with LPM and EFT, poor segmental mobility through lumbar spine    Functional Limitations: sitting/standing limited to 30 min, painful/difficult with sit-> stand transition, pain/difficult and limited squat mobility     Plan Of Care: Body Mechanics Education, NMR, Proprioceptive Activites, Instruction in HEP, Therapeutic Exercise, Balance/Gait training and Soft Tissue/Joint Mobilization (lumbar spine, sacrum, innom FMs + gr i-iv)  Frequency/Duration: 2 times a week for 10 weeks. Anticipated D/C date: 11/14/14    Certification Dates: From: 09/18/14  To: 11/14/14    Goals:   End of Certification   Date (Body Area, Impairment Goal, Functional   Activity, Target Performance) Time Frame Status Date/  Initial   09/18/2014  Pt will demo a functional squat with neutral LE's without c/o P! To return to squatting & lifting for ADL's daily without limitations  10 weeks Initial Eval     09/18/2014   Pt to demonstrate efficient weight shift into LEs with sit-> stand with pain < 1/10   10 weeks Initial Eval    09/18/2014   Decrease Oswestry Low Back Pain Questionnaire to <18% to exceed Minimal Detectable  Change (MDC) of 10 points.  10 weeks Initial Eval    09/18/2014   Improve LPM all directions to 3/5 or greater with good initiation  10 weeks Initial Eval    09/18/2014   Pt to demonstrate improved functional alignment in sitting/standing with EFT/VCT > 3/5  10 weeks Initial Eval      Signature: Darrnell Mangiaracina B. Sheliah Hatch, PT, DPT  [place] [address]    Date: 09/18/2014    Signature: Everlean Alstrom,* ___________________________ Date:     Patient Name: Michele [name]  MRN: [id number]

## 2014-09-20 ENCOUNTER — Inpatient Hospital Stay: Payer: Medicare Other | Admitting: Rehabilitative and Restorative Service Providers"

## 2014-09-20 DIAGNOSIS — M545 Low back pain, unspecified: Secondary | ICD-10-CM

## 2014-09-20 NOTE — PT/OT Therapy Note (Signed)
DAILY NOTE   09/20/2014     Time In/Out: 8:15 - 9:02  Total Treatment Time: 47 minutes  Visit Number: 2    # of Authorized Visits: 20 Visit #: 2    Diagnosis (Treating/Medical):     ICD-10-CM    1. Midline low back pain without sciatica M54.5        Subjective:  Michele [name] reports that she can do housework/cleaning, can drive, can go grocery shopping. When she is standing for any length of time, the L leg starts shaking and feels much weaker. Reports that she shakes so much that her whole body can feel like it's shaking at times.     Objective:   Treatment:  Therapeutic Exercise: to improve: Balance, Flexibility/ROM, Stabilization and Strength   RCB with small hills and L3 resistance with subjective recorded  Modifications/Patient education: education on soreness following MT techniques  LP 60 lbs x 20 reps with cues for patellar tracking    NMR:   L Pelvic AE with end range holds and COI to increase core initiation to improve pelvic girdle stability  L Pelvic AE with end range holds and COI with LE flex/ adduction/ IR/ DF/ Inv to improve neuromuscular firing to improve stepping ability  L Pelvic posterior depression with end range holds to increase weight bearing into L LE to improve weight acceptance    Therapeutic Activities:    n/a    Manual Therapy:   Sacral gapping L SI with FM's hip IR  Sacral springing with PA's at sup, mid and inf sacral bases with FMP silent metronome - increased time spent at mid and inf aspects due to pain/hard end feel  ST mobs l/s paraspinals with superficial fascia mobs using cascade of techniques throughout l/s  Bony contour clearing spinous processes lower t/s and l/s  Increased time with superficial fascia mobs and ST mobs throughout L QL  Unilateral parallels L t/s and l/s paraspinals at all 3 muscle layers with cascade of techniques      Current Measurements (ROM, Strength, Girth, Outcomes, etc.):    Repeated l/s extension: no change in symptoms  L/s Flexion: no increased pain or  neural tension with l/s flexion     Modalities: None     Assessment (response to treatment):   Increased restrictions L QL that is contributing to patient's pain and overall increased extensor tone. Patient with B weakness of LE's and incorporated strengthening into treatment plan during session. Patient would benefit from HEP with pictures for patient to make gains outside of PT. Poor sacral mobility contributing to patient's pain as well.    Progress towards functional goals: see assessment    Patient requires continued skilled care to: improve core activation and stability to provide support for l/s; improve posture to decrease stress/strain on l/s; increase strength B LE's to assist with ADL's    Plan:  Continue with Plan of Care    Will Wilson, [place] [id number]  09/20/2014

## 2014-09-25 ENCOUNTER — Inpatient Hospital Stay: Payer: Medicare Other | Admitting: Physical Therapist

## 2014-09-25 DIAGNOSIS — M545 Low back pain, unspecified: Secondary | ICD-10-CM

## 2014-09-25 NOTE — PT/OT Therapy Note (Signed)
DAILY NOTE  09/25/2014    Time In/Out: 916 - 956 Total Treatment Time: 40 mins Visit Number:  3    Certification Status Ends: 11/14/14 2x week for 10 weeks   G-Codes required on visit # 10    3 of 18 medicare visits utilized    # of Authorized Visits: 20 Visit #: 3      Diagnosis (Treating/Medical):     ICD-10-CM    1. Midline low back pain without sciatica M54.5            Subjective:    Functional Changes: My legs were shaking more before the last session now theyre shaking a little bit less. Especially when Im standing still the legs. The L leg shakes the most    Objective:   Treatment:  Therapeutic Exercise:   Assisted Warm-up RCB seat 3 no resistance x 5 mins  Manually resisted hip and with increased traction to L LE  Education, instruction, and performance of HEP update with HO: Access Code: [email or web address]   URL: [email or web address]   Date: 09/25/2014   Prepared by: Franki Monte     Exercises   Beginner Clam - 10 Reps - 3 Sets - 0 Hold (sec) - 3x daily - 4x weekly   Modifed Supine Single Knee to Chest Stretch - 10 Reps - 3 Sets - 10 Hold (sec) - 3x daily - 5x weekly   Supine Double Knee to Chest - 10 Reps - 3 Sets - 10 Hold (sec) - 3x daily - 5x weekly   Hooklying Isometric Hip Flexion - 10 Reps - 3 Sets - 10 Hold (sec) - 3x daily - 5x weekly     Modifications/Patient Education:  (Verbal, Visual and Tactile cues )    NMR:  Pelvic posterior depression with end range holds to increase weight bearing into L LE to improve weight acceptance progressed to Pelvic PD with LE ext/ abd/IR with end range holds and COI to improve weight acceptance into LE        Therapeutic Activities:  -----        Manual Therapy:   Bony contours L1-S1 SPs  downglides L4-S1 with CR to PD      Current Measurements (ROM, Strength, Girth, Outcomes, etc.):        Assessment (response to treatment):   Pt with onset of cramping with attempts at issuing bracing 1 to HEP even with cues so did not issue. Pt with note of cramping into legs  also with manual resistance to improve LE strengthening and required cues to adjust exertion. Recommended bananas, gatorade, and mustard as potential combatants to cramping.     Progress towards functional goals: Goals:   End of Certification   Date (Body Area, Impairment Goal, Functional   Activity, Target Performance) Time Frame Status Date/  Initial   09/18/2014 Pt will demo a functional squat with neutral LE's without c/o P! To return to squatting & lifting for ADL's daily without limitations 10 weeks Initial Eval    09/18/2014  Pt to demonstrate efficient weight shift into LEs with sit-> stand with pain < 1/10  10 weeks Initial Eval    09/18/2014  Decrease Oswestry Low Back Pain Questionnaire to <18% to exceed Minimal Detectable Change (MDC) of 10 points. 10 weeks Initial Eval    09/18/2014  Improve LPM all directions to 3/5 or greater with good initiation 10 weeks Initial Eval    09/18/2014  Pt to demonstrate improved functional alignment in sitting/standing with EFT/VCT >  3/5 10 weeks Initial Eval          Patient requires continued skilled care to: progress LE and core strength    Plan:  Continue with Plan of Care    Franki Monte, PT,  DPT, CFMT  [place] [address]  09/25/2014

## 2014-09-27 ENCOUNTER — Inpatient Hospital Stay: Payer: Medicare Other | Admitting: Physical Therapist

## 2014-09-27 DIAGNOSIS — M545 Low back pain, unspecified: Secondary | ICD-10-CM

## 2014-09-27 NOTE — PT/OT Therapy Note (Signed)
DAILY NOTE  09/27/2014    Time In/Out: 315 - 355 Total Treatment Time: 40 mins Visit Number:  4    Certification Status Ends: 11-16-14 2x week for 10 weeks G-Codes required on visit # 10    4 of 18 medicare visits utilized    # of Authorized Visits: 20 Visit #: 4      Diagnosis (Treating/Medical):     ICD-10-CM    1. Midline low back pain without sciatica M54.5            Subjective:    Functional Changes: Im doing the exercises though I was a little sore after in the muscles on the outside of my hips (points to glute med) it didn't hurt.     Objective:   Treatment:  Therapeutic Exercise:   Assisted Warm-up RCB seat 2 hills l3 x 5 mins (billed with NMR)  Modifications/Patient Education:  (Verbal, Visual and Tactile cues )    NMR:  Stabilizing reversals in hooklying all directions with increased emphasis on traction and irradiation      Therapeutic Activities:  -----        Manual Therapy:   Prone sciatic nerve tracing from sciatic notch through distal hamstring   L SIJ gapping with C-R to hip rot f/b prolonged hold for NMR then COI for MC  R SL L  SIJ gapping with compression through R ASIS with sustained pressure and AROM to L hip IR AROM  LAD L hip with C-R to hip hike with opp knee flexed to chest with localization via abd/add and IR/ER  L innom flexion with sacrum blocked with C--R into innom ext with traction with isolated innom flex   for NMR         Current Measurements (ROM, Strength, Girth, Outcomes, etc.):      Assessment (response to treatment):   Emphasized L innominate ans sacral mobility to further gap and expand L5 and S1. Improved core strength noted with stabilizing reversals vs prior session with prolonged holds. Pt noted with L trunk SB in standing just before onset of shaking of L LE at end of session.    Progress towards functional goals: Goals:   End of Certification   Date (Body Area, Impairment Goal, Functional   Activity, Target Performance) Time Frame Status Date/  Initial    09/18/2014 Pt will demo a functional squat with neutral LE's without c/o P! To return to squatting & lifting for ADL's daily without limitations 10 weeks Initial Eval    09/18/2014  Pt to demonstrate efficient weight shift into LEs with sit-> stand with pain < 1/10  10 weeks Initial Eval    09/18/2014  Decrease Oswestry Low Back Pain Questionnaire to <18% to exceed Minimal Detectable Change (MDC) of 10 points. 10 weeks Initial Eval    09/18/2014  Improve LPM all directions to 3/5 or greater with good initiation 10 weeks Initial Eval    09/18/2014  Pt to demonstrate improved functional alignment in sitting/standing with EFT/VCT > 3/5 10 weeks Initial Eval      Patient requires continued skilled care to: assess abd contents, ant L/S to improve core strength- assess and manage neutral trunk-     Plan:  Continue with Plan of Care    Franki Monte, PT,  DPT, CFMT  [place] [address]  09/27/2014

## 2014-10-04 ENCOUNTER — Inpatient Hospital Stay: Payer: Medicare Other | Admitting: Rehabilitative and Restorative Service Providers"

## 2014-10-04 DIAGNOSIS — M545 Low back pain, unspecified: Secondary | ICD-10-CM

## 2014-10-04 NOTE — PT/OT Therapy Note (Signed)
DAILY NOTE  10/04/2014    Time In/Out: 1:16 - 2:57 Total Treatment Time: 41 mins Visit Number:  5    Certification Status Ends: 12/03/2014 2x week for 10 weeks G-Codes required on visit # 10    5 of 18 medicare visits utilized    # of Authorized Visits: 20 Visit #: 5    Primary language - greek    Diagnosis (Treating/Medical):     ICD-10-CM    1. Midline low back pain without sciatica M54.5            Subjective:    Functional Changes: Pt reports "slight better. My legs is burning/tingling. 2 nights ago I couldn't sleep. The shaking is a little less than before but my back pain is about the same." Pt reports slight difference in leg shake after last session. "Some days I don't have at all, but somedays it's very bad, especially when I'm bending and my leg gets tired." Pt demos forward bend from hips without use of legs. (with TherEx)    Objective:   Treatment:  Therapeutic Exercise:   Extended time with subj  W/U with RCB 5 min with subj, random hills, lvl 4 resist  Edu: Pt reports not eating meat/protein. Discussed importance of protein in muscle health. Also discussed potassium.     Therapeutic Activities:    Hip hinge squat training at high/low table emphasizing use of LEs and push/pinch of quads and glutes.       Manual Therapy:   ST clearing of lumbar fascia, contours of spinal groove, iliac crest, SIJ,  Strumming through paraspinals.  S1 gapping/inferior glide with prone LTRs    In sidelying:  Gapping L S1 on L5 with CR AE into PD FM. Iso hold PD after mobilizations        Current Measurements (ROM, Strength, Girth, Outcomes, etc.):   EFT: 4/5     Assessment (response to treatment):   Pt with very poor lifting strategy due to LE weakness. Focused on squat to progress LE strengthening for functional tasks - pt with phasic shakes so took rest breaks. Stressed importance of using LEs vs bending at back for lumbar spine health. Also edu reg importance of protein and potassium for muscle recovery/strengthening.      Progress towards functional goals: Goals:   End of Certification   Date (Body Area, Impairment Goal, Functional   Activity, Target Performance) Time Frame Status Date/  Initial   09/18/2014 Pt will demo a functional squat with neutral LE's without c/o P! To return to squatting & lifting for ADL's daily without limitations 10 weeks     09/18/2014  Pt to demonstrate efficient weight shift into LEs with sit-> stand with pain < 1/10  10 weeks     09/18/2014  Decrease Oswestry Low Back Pain Questionnaire to <18% to exceed Minimal Detectable Change (MDC) of 10 points. 10 weeks     09/18/2014  Improve LPM all directions to 3/5 or greater with good initiation 10 weeks     09/18/2014  Pt to demonstrate improved functional alignment in sitting/standing with EFT/VCT > 3/5 10 weeks EFT standing: 4/5 10/04/14 EBW      Patient requires continued skilled care to: assess abd contents, ant L/S to improve core strength- assess and manage neutral trunk-     Plan:  Continue with Plan of Care    Taziah Difatta B. Sheliah Hatch, PT, DPT  [place] [address]   10/04/2014

## 2014-10-09 NOTE — PT/OT Therapy Note (Signed)
Change request made to billing/coding team to correct treatment codes charged as incorrect selection made at time of service.    Please remove 1 unit(s) of NMR and add 1 unit(s) of TherEx.        Kaymon Denomme B. Sheliah Hatch, PT, DPT  [place] [phone]

## 2014-10-10 ENCOUNTER — Inpatient Hospital Stay
Payer: Medicare Other | Attending: Physical Medicine & Rehabilitation | Admitting: Rehabilitative and Restorative Service Providers"

## 2014-10-10 DIAGNOSIS — M545 Low back pain, unspecified: Secondary | ICD-10-CM

## 2014-10-10 NOTE — PT/OT Therapy Note (Signed)
DAILY NOTE  10/10/2014    Time In/Out: 7:30 - 8:08  Total Treatment Time: 38 mins Visit Number:  6    Certification Status Ends: Dec 07, 2014 2x week for 10 weeks G-Codes required on visit # 10    6 of 18 medicare visits utilized    # of Authorized Visits: 20 Visit #: 5    Primary language - greek    Diagnosis (Treating/Medical):     ICD-10-CM    1. Midline low back pain without sciatica M54.5            Subjective:    Functional Changes: Pt reports "Back was up and down, but legs shaking."  (with TherEx)    Objective:   Treatment:  Therapeutic Exercise:   RCB: 6 min lvl 5 RH resist with subj report  Access Code: [email or web address]   URL: [email or web address]   Date: 10/10/2014   Prepared by: Serita Kyle     Exercises   Active Straight Leg Raise with Quad Set - 10 Reps - 3 Sets - 5 Hold (sec) - 1x daily - 5x weekly   Supine Bridge - 10 Reps - 3 Sets - 5 Hold (sec) - 1x daily - 5x weekly (added Red TB in session  Squat - 10 Reps - 3 Sets - 5 Hold (sec) - 1x daily - 5x weekly     TG lvl 8 x 3 min with Red TB distal thigh for LE alignment  LP 10 x 60 lbs, 10 x 70lbs, 10 x 80 lbs    Edu: discussed effects of smoking on bone/muscle health and advised cessation of smoking with gradual decrease in amount of cigarettes/day. Discussed DOMS and expectation of being sore after today's session.     Therapeutic Activities:    Cont to practice hip hinge squatting, use of mirror for visual cue, use of dowel to cue avoiding knees in front of toes             Current Measurements (ROM, Strength, Girth, Outcomes, etc.):     Quad lag present with SLR bilat     Assessment (response to treatment):   Pt progressing well with LE strengthening in today's session, though fatigues quickly. Pt with improved squat from since last visit. Would benefit from consistency in strengthening LEs to dec strain to LB    Progress towards functional goals: Goals:   End of Certification   Date (Body Area, Impairment Goal, Functional   Activity, Target  Performance) Time Frame Status Date/  Initial   09/18/2014 Pt will demo a functional squat with neutral LE's without c/o P! To return to squatting & lifting for ADL's daily without limitations 10 weeks continuuing to progress squatting form/LE strengthening  10/10/14 EBW   09/18/2014  Pt to demonstrate efficient weight shift into LEs with sit-> stand with pain < 1/10  10 weeks     09/18/2014  Decrease Oswestry Low Back Pain Questionnaire to <18% to exceed Minimal Detectable Change (MDC) of 10 points. 10 weeks     09/18/2014  Improve LPM all directions to 3/5 or greater with good initiation 10 weeks     09/18/2014  Pt to demonstrate improved functional alignment in sitting/standing with EFT/VCT > 3/5 10 weeks EFT standing: 4/5 10/04/14 EBW      Patient requires continued skilled care to: assess abd contents, ant L/S to improve core strength- assess and manage neutral trunk-     Plan:  Continue with Plan of Care    Jaya Lapka B.  Sheliah Hatch, PT, DPT  [place] [address]   10/10/2014

## 2014-10-13 ENCOUNTER — Inpatient Hospital Stay: Payer: Medicare Other | Admitting: Physical Therapist

## 2014-10-13 DIAGNOSIS — M545 Low back pain, unspecified: Secondary | ICD-10-CM

## 2014-10-13 NOTE — PT/OT Therapy Note (Signed)
DAILY NOTE  10/13/2014    Time In/Out: 747 - 826 Total Treatment Time: 39 mins Visit Number:  7    Certification Status Ends: 24-Nov-2014 2x week for 10 weeks G-Codes required on visit # 10    7 of 18 medicare visits utilized    # of Authorized Visits: 20 Visit #: 7      Diagnosis (Treating/Medical):     ICD-10-CM    1. Midline low back pain without sciatica M54.5            Subjective:    Functional Changes:  My legs arent shaking as much as they were before. The only thing i have is p! In the hip here- (points to L hip). I feel it in both hips but L hip is worse - I notice it when I go up the stairs or if I rise to get up from sitting too long. Its very tired.     Objective:   Treatment:  Therapeutic Exercise:   Assisted Warm-up RCB seat 2 hills l1 x 5 mins  LP 70 lbs with green TB to promote hip abd  X 2 mins  Modifications/Patient Education:  (Verbal, Visual and Tactile cues )    NMR:  ----    Therapeutic Activities: partial then full step ups 6 and 8" steps with cues to put whole foot on the step, hinge forward over foot and squeeze glute and quad together to ascend step, then hip hinge to descend back down step  Biodex machine LOS   Mod level  Trial 1: 2:25           Trial 2 1:45            Manual Therapy:   -----      Current Measurements (ROM, Strength, Girth, Outcomes, etc.):        Assessment (response to treatment):   Worked to improve functional quad strength with glute and quad co-contraction to increase ease with stair negotiation during session today.    Progress towards functional goals:     End of Certification   Date (Body Area, Impairment Goal, Functional   Activity, Target Performance) Time Frame Status Date/  Initial   09/18/2014 Pt will demo a functional squat with neutral LE's without c/o P! To return to squatting & lifting for ADL's daily without limitations 10 weeks continuing to progress squatting form/LE strengthening  10/10/14 EBW   09/18/2014  Pt to demonstrate  efficient weight shift into LEs with sit-> stand with pain < 1/10  10 weeks emphasized glute and qud strength for increased ease and control with sit to stand  [facility] 10/13/14   09/18/2014  Decrease Oswestry Low Back Pain Questionnaire to <18% to exceed Minimal Detectable Change (MDC) of 10 points. 10 weeks Osw 30% vs 32% @ IE no change  [facility] 10/13/14   09/18/2014  Improve LPM all directions to 3/5 or greater with good initiation 10 weeks     09/18/2014  Pt to demonstrate improved functional alignment in sitting/standing with EFT/VCT > 3/5 10 weeks EFT standing: 4/5 10/04/14 EBW          Patient requires continued skilled care to: progress functional strength    Plan:  Continue with Plan of Care and Adjustments/Modifications: though pt encouraged several weeks ago to schedule further appts- she has not- pt top be placed on waitlist    Franki Monte, PT,  DPT, CFMT  [place] [address]  10/13/2014

## 2014-10-16 ENCOUNTER — Encounter (INDEPENDENT_AMBULATORY_CARE_PROVIDER_SITE_OTHER): Payer: Self-pay | Admitting: Family Medicine

## 2014-10-16 ENCOUNTER — Ambulatory Visit (INDEPENDENT_AMBULATORY_CARE_PROVIDER_SITE_OTHER): Payer: Medicare Other | Admitting: Family Medicine

## 2014-10-16 VITALS — BP 110/62 | HR 66 | Temp 98.2°F | Resp 16 | Ht 60.0 in | Wt 116.0 lb

## 2014-10-16 DIAGNOSIS — M545 Low back pain, unspecified: Secondary | ICD-10-CM

## 2014-10-16 DIAGNOSIS — R519 Headache, unspecified: Secondary | ICD-10-CM

## 2014-10-16 DIAGNOSIS — G8929 Other chronic pain: Secondary | ICD-10-CM

## 2014-10-16 DIAGNOSIS — K2270 Barrett's esophagus without dysplasia: Secondary | ICD-10-CM

## 2014-10-16 DIAGNOSIS — M5440 Lumbago with sciatica, unspecified side: Secondary | ICD-10-CM

## 2014-10-16 DIAGNOSIS — D693 Immune thrombocytopenic purpura: Secondary | ICD-10-CM

## 2014-10-16 DIAGNOSIS — Z9889 Other specified postprocedural states: Secondary | ICD-10-CM

## 2014-10-16 DIAGNOSIS — M8580 Other specified disorders of bone density and structure, unspecified site: Secondary | ICD-10-CM

## 2014-10-16 DIAGNOSIS — R51 Headache: Secondary | ICD-10-CM

## 2014-10-16 DIAGNOSIS — M549 Dorsalgia, unspecified: Secondary | ICD-10-CM

## 2014-10-16 DIAGNOSIS — Z01818 Encounter for other preprocedural examination: Secondary | ICD-10-CM

## 2014-10-16 DIAGNOSIS — M542 Cervicalgia: Secondary | ICD-10-CM

## 2014-10-16 NOTE — Progress Notes (Signed)
1. Have you self referred yourself since we last saw you? No  Refer to care team   Or   Add specialists:

## 2014-10-16 NOTE — Progress Notes (Signed)
Subjective:       Patient ID: Michele [name] is a 74 y.o. female.    HPI   Preop : colonoscopy, endoscopy 10/23/14    Follow up by Dr Gwenlyn Perking ,  complains of heart burn, better on Prilosec 20 mg   , History of Barrett     . 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  Does not want to have surgery   MRI There is mild levoscoliotic deformity. The vertebral bodies are normalin height and configuration. There are no findings to suggest an acute fractures. L1-L2: There is minimal disc bulge but no significant compromise of the  central canal or neuroforamina.L2-L3: There is disc degeneration with disc height preservation. There is diffuse disc bulge with bilateral facet joint hypertrophic changesresulting in moderate degree of central canal and bilateral foramina stenosis, stable. There is small, left foraminal annular tear.L3-L4: There is disc degeneration with disc height loss, stable. There is broad-based posterior osteophyte disc bulge complex with bilateral facet joint hypertrophic changes resulting in moderate to severe central canal and bilateral foramina stenosis, stable L4-L5: There is disc degeneration with disc height preservation. There is diffuse disc bulge, ligamentum flavum thickening and bilateral facetjoint hypertrophic changes resulting in severe central canal and bilateral foramina stenosis, stable. There is left foraminal annular tear.L5-S1: There is disc degeneration with disc height preservation. There is minimal disc bulge with bilateral facet joint hypertrophic changes resulting in mild bilateral neuroforamina stenosis with small, right  foraminal annular tear, stable.The conus tip is seen at L1-L2, and has normal morphology and  signal  intensity. There are no paraspinal masses or collections    . Osteopenia      Taking Calcium and vit D      . Neck pain:  Stable       MRI cervical spine in 2011 shows Multilevel degenerative changes of the cervical spine resulting in marked, central canal and bilateral neural foraminal stenoses at multiple levels.       . Thrombocytopenia.. : stable   Lab Results   Component Value Date    WBC 4.6 05/09/2014    HGB 13.1 05/09/2014    HCT 39.5 05/09/2014    MCV 91.4 05/09/2014    PLT 133* 01/27/2014        family history disease : father died at 61 years ,    Social; house cleaning still working part time , married, 4 chidren, from [place]  The following portions of the patient's history were reviewed  The following portions of the patient's history were reviewed and updated as appropriate:   She  has a past medical history of Gastroesophageal reflux disease; Barrett's esophagus; Abnormal EKG; Arthralgia; Cervical spinal stenosis; Osteopenia; Depression; Colonic polyp; Osteoarthritis; Lower back pain; and Disc disorder.  She  does not have any pertinent problems on file.  She  has past surgical history that includes Appendectomy.  Her family history is negative for Breast cancer.  She  reports that she has been smoking Cigarettes.  She has a 25 pack-year smoking history. She has never used smokeless tobacco. She reports that she does not drink alcohol or use illicit drugs.  She  has a current medication list which includes the following prescription(s): cholecalciferol, multivitamin, and omeprazole.  Current Outpatient Prescriptions on File Prior to Visit   Medication Sig Dispense Refill   . Cholecalciferol (VITAMIN D PO) Take by mouth.     . Multiple Vitamin (MULTIVITAMIN) tablet Take 1 tablet by mouth daily.     Marland Kitchen omeprazole (PRILOSEC) 20 MG capsule TAKE 1 CAPSULE (20 MG TOTAL) BY MOUTH DAILY. 90 capsule 3   . [DISCONTINUED] Cyanocobalamin (VITAMIN B-12 PO) Take by mouth.       No current  facility-administered medications on file prior to visit.     She has No Known Allergies..    Review of Systems   Cardiovascular: Negative for palpitations.   Musculoskeletal: Positive for arthralgias.           Objective:    Physical Exam   Constitutional: She appears well-developed and well-nourished.   HENT:   Head: Normocephalic and atraumatic.   Cardiovascular: Normal rate, regular rhythm and normal heart sounds.  Exam reveals no gallop and no friction rub.    No murmur heard.  Pulmonary/Chest: No respiratory distress. She has no wheezes. She has no rales. She exhibits no tenderness.   breast: no predominant masse,         Assessment/plan :       1. Idiopathic thrombocytopenia purpura  CBC and differential   2. Midline low back pain without sciatica  Physical therapy, patient refuses surgical consult   3. Neck pain  US Carotid Duplex Dopp Comp Bilateral   4. Osteopenia  Calcium and vit D    5. S/P colonoscopic polypectomy     6. Midline low back pain with sciatica, sciatica laterality unspecified     7. Preop examination  Comprehensive metabolic panel    ECG 12 lead   8. Persistent headaches  US Carotid Duplex Dopp Comp Bilateral   9. Barrett's esophagus without dysplasia           Procedures

## 2014-10-18 ENCOUNTER — Inpatient Hospital Stay: Payer: Medicare Other | Admitting: Rehabilitative and Restorative Service Providers"

## 2014-10-18 ENCOUNTER — Ambulatory Visit
Admission: RE | Admit: 2014-10-18 | Discharge: 2014-10-18 | Disposition: A | Discharge status: Home / Self Care | Payer: Medicare Other | Source: Ambulatory Visit | Attending: Family Medicine | Admitting: Family Medicine

## 2014-10-18 ENCOUNTER — Ambulatory Visit (INDEPENDENT_AMBULATORY_CARE_PROVIDER_SITE_OTHER): Payer: Medicare Other | Admitting: Surgery

## 2014-10-18 DIAGNOSIS — D693 Immune thrombocytopenic purpura: Secondary | ICD-10-CM | POA: Insufficient documentation

## 2014-10-18 DIAGNOSIS — M545 Low back pain, unspecified: Secondary | ICD-10-CM

## 2014-10-18 DIAGNOSIS — Z01818 Encounter for other preprocedural examination: Secondary | ICD-10-CM | POA: Insufficient documentation

## 2014-10-18 LAB — CBC AND DIFFERENTIAL
Basophils Absolute Automated: 0.01 10*3/uL (ref 0.00–0.20)
Basophils Automated: 0 %
Eosinophils Absolute Automated: 0.06 10*3/uL (ref 0.00–0.70)
Eosinophils Automated: 2 %
Hematocrit: 42.5 % (ref 37.0–47.0)
Hgb: 13.5 g/dL (ref 12.0–16.0)
Immature Granulocytes Absolute: 0 10*3/uL
Immature Granulocytes: 0 %
Lymphocytes Absolute Automated: 1.42 10*3/uL (ref 0.50–4.40)
Lymphocytes Automated: 40 %
MCH: 30.1 pg (ref 28.0–32.0)
MCHC: 31.8 g/dL — ABNORMAL LOW (ref 32.0–36.0)
MCV: 94.7 fL (ref 80.0–100.0)
MPV: 12.3 fL (ref 9.4–12.3)
Monocytes Absolute Automated: 0.39 10*3/uL (ref 0.00–1.20)
Monocytes: 11 %
Neutrophils Absolute: 1.69 10*3/uL — ABNORMAL LOW (ref 1.80–8.10)
Neutrophils: 47 %
Nucleated RBC: 0 /100 WBC (ref 0–1)
Platelets: 141 10*3/uL (ref 140–400)
RBC: 4.49 10*6/uL (ref 4.20–5.40)
RDW: 13 % (ref 12–15)
WBC: 3.57 10*3/uL (ref 3.50–10.80)

## 2014-10-18 LAB — COMPREHENSIVE METABOLIC PANEL
ALT: 13 U/L (ref 0–55)
AST (SGOT): 19 U/L (ref 5–34)
Albumin/Globulin Ratio: 1.5 (ref 0.9–2.2)
Albumin: 3.9 g/dL (ref 3.5–5.0)
Alkaline Phosphatase: 66 U/L (ref 37–106)
BUN: 15 mg/dL (ref 7.0–19.0)
Bilirubin, Total: 0.7 mg/dL (ref 0.1–1.2)
CO2: 31 mEq/L — ABNORMAL HIGH (ref 21–30)
Calcium: 9.2 mg/dL (ref 7.9–10.2)
Chloride: 105 mEq/L (ref 100–111)
Creatinine: 0.7 mg/dL (ref 0.4–1.5)
Globulin: 2.6 g/dL (ref 2.0–3.7)
Glucose: 87 mg/dL (ref 70–100)
Potassium: 4.2 mEq/L (ref 3.5–5.3)
Protein, Total: 6.5 g/dL (ref 6.0–8.3)
Sodium: 142 mEq/L (ref 135–146)

## 2014-10-18 LAB — GFR: EGFR: >60

## 2014-10-18 LAB — HEMOLYSIS INDEX: Hemolysis Index: 7 (ref 0–18)

## 2014-10-18 NOTE — PT/OT Therapy Note (Addendum)
DAILY NOTE  10/18/2014    Time In/Out: 2:32 - 3:13 Total Treatment Time: 41 minutes Visit Number: 8    Certification Status Ends: 15-Nov-2014 2x week for 10 weeks G-Codes required on visit # 10    8 of 18 medicare visits utilized    # of Authorized Visits: 20 Visit #: 8      Diagnosis (Treating/Medical):     ICD-10-CM    1. Midline low back pain without sciatica M54.5        Subjective:    Functional Changes:  Reports that she is doing much better. Reports that the sharp, burning pain into the legs is much, much less. Feels that she is getting stronger with sit to stands.    Objective:   Treatment:  Therapeutic Exercise:   RCB x 6' with L8 resistance and large hills with subjective recorded  LP 75 lbs x 30 reps  Cybex HS curls with 2 plates of resistance x 30 reps  Gastroc stretch tension on/tension off on slant board    NMR:    L Pelvic AE with end range holds and COI to increase core initiation to improve pelvic girdle stability  L Pelvic AE with end range holds and COI with LE flex/ adduction/ IR/ DF/ Inv to improve neuromuscular firing to improve stepping ability  L Pelvic posterior depression with end range holds to increase weight bearing into L LE to improve weight acceptance    Therapeutic Activities:   n/a    Manual Therapy:   ST mobs throughout sacral bony contours  Sacral PA mobs with FM's as needed  Sacral gapping with C-R techniques with FM's hip IR - performed in SL    Current Measurements (ROM, Strength, Girth, Outcomes, etc.):        Assessment (response to treatment):   Slight increase in LP during session - patient with slight strain in l/s, however when cued to activate multifidus and core, patient with decreased pain. Patient with hypomobile L sided sacrum today which was addressed. Patient with slight break in PT due to husband being sick and will return next month.    Progress towards functional goals:     End of Certification   Date (Body Area, Impairment Goal, Functional   Activity,  Target Performance) Time Frame Status Date/  Initial   09/18/2014 Pt will demo a functional squat with neutral LE's without c/o P! To return to squatting & lifting for ADL's daily without limitations 10 weeks continuing to progress squatting form/LE strengthening  10/10/14 EBW   09/18/2014  Pt to demonstrate efficient weight shift into LEs with sit-> stand with pain < 1/10  10 weeks emphasized glute and qud strength for increased ease and control with sit to stand  [facility] 10/13/14   09/18/2014  Decrease Oswestry Low Back Pain Questionnaire to <18% to exceed Minimal Detectable Change (MDC) of 10 points. 10 weeks Osw 30% vs 32% @ IE no change  [facility] 10/13/14   09/18/2014  Improve LPM all directions to 3/5 or greater with good initiation 10 weeks     09/18/2014  Pt to demonstrate improved functional alignment in sitting/standing with EFT/VCT > 3/5 10 weeks EFT standing: 4/5 10/04/14 EBW        Patient requires continued skilled care to: progress functional strength and core strength to return to PLOF    Plan:  Continue with POC.    Chipper Oman, DPT [phone]  10/18/2014

## 2014-10-19 ENCOUNTER — Telehealth: Payer: Medicare Other

## 2014-10-25 ENCOUNTER — Inpatient Hospital Stay: Payer: Medicare Other | Admitting: Rehabilitative and Restorative Service Providers"

## 2014-10-27 ENCOUNTER — Inpatient Hospital Stay: Payer: Medicare Other | Admitting: Rehabilitative and Restorative Service Providers"

## 2014-10-31 ENCOUNTER — Inpatient Hospital Stay: Payer: Medicare Other | Admitting: Physical Therapist

## 2014-11-01 ENCOUNTER — Encounter (INDEPENDENT_AMBULATORY_CARE_PROVIDER_SITE_OTHER): Payer: Self-pay | Admitting: Surgery

## 2014-11-01 ENCOUNTER — Ambulatory Visit (INDEPENDENT_AMBULATORY_CARE_PROVIDER_SITE_OTHER): Payer: Medicare Other | Admitting: Surgery

## 2014-11-01 ENCOUNTER — Ambulatory Visit
Admission: RE | Admit: 2014-11-01 | Discharge: 2014-11-01 | Disposition: A | Discharge status: Home / Self Care | Payer: Medicare Other | Source: Ambulatory Visit | Attending: Family Medicine | Admitting: Family Medicine

## 2014-11-01 VITALS — BP 131/71 | HR 67 | Temp 98.4°F | Ht 59.0 in | Wt 113.8 lb

## 2014-11-01 DIAGNOSIS — M542 Cervicalgia: Secondary | ICD-10-CM | POA: Insufficient documentation

## 2014-11-01 DIAGNOSIS — Z1239 Encounter for other screening for malignant neoplasm of breast: Secondary | ICD-10-CM

## 2014-11-01 DIAGNOSIS — N644 Mastodynia: Secondary | ICD-10-CM

## 2014-11-01 DIAGNOSIS — R51 Headache: Secondary | ICD-10-CM | POA: Insufficient documentation

## 2014-11-02 ENCOUNTER — Inpatient Hospital Stay: Payer: Medicare Other | Admitting: Physical Therapist

## 2014-11-04 NOTE — Progress Notes (Signed)
Subjective:       Patient ID: Michele [name] is a 74 y.o. female.    HPI  Michele [name] is a 74 year old female who reports a several month history of an itching sensation throughout both breasts.  This comes and goes, occurring most every day.  She describes a needlelike sensation, but not a specific pain.  She also denies breast masses, skin changes or retraction, as well as nipple abnormalities or discharge.  Her last mammogram was a screening study, performed on April 17, 2014.  This was normal.  She was referred to me for further evaluation.    She also denies any prior breast problems.  She has not previously required breast biopsy or breast surgery.    FAMILY HISTORY: She has no family history of breast or ovarian cancer.    The following portions of the patient's history were reviewed and updated as appropriate: allergies, current medications, past family history, past medical history, past social history, past surgical history and problem list.    Review of Systems  Constitutional:  negative  HEENT:  vision problems - glasses/contacts  Cardiovascular:  negative  Respiratory:  negative  Gastrointestinal:  negative  Genitourinary:  negative  Musculoskeletal:  negative  Skin:  negative  Neurologic:  negative  Psychiatric:  negative  Endocrine:  negative  Hematologic:  negative                             Objective:    Physical Exam   Constitutional: She is oriented to person, place, and time. She appears well-developed and well-nourished.   HENT:   Head: Normocephalic and atraumatic.   Eyes: No scleral icterus.   Neck: Normal range of motion. Neck supple.   Cardiovascular: Normal rate and regular rhythm.    Pulmonary/Chest: Breath sounds normal. Right breast exhibits no inverted nipple, no mass, no nipple discharge, no skin change and no tenderness. Left breast exhibits no inverted nipple, no mass, no nipple discharge, no skin change and no tenderness. Breasts are symmetrical.       Abdominal: Soft. She  exhibits no distension. There is no tenderness.   Musculoskeletal:        Right shoulder: Normal.        Left shoulder: Normal.   Lymphadenopathy:     She has no cervical adenopathy.     She has no axillary adenopathy.        Right: No supraclavicular adenopathy present.        Left: No supraclavicular adenopathy present.   Neurological: She is alert and oriented to person, place, and time.   Skin: Skin is warm and dry. No lesion noted.       REVIEW OF IMAGING: Verne Carrow)  April 05, 2013: A bilateral screening mammogram shows benign-appearing vascular calcifications bilaterally.  There are also benign-appearing non-vascular calcifications on the right.    April 17, 2014: A bilateral screening mammogram shows no significant change.  BI-RADS 1.        Assessment:       A 74 year old female with breast pruritus, no suspicious findings on clinical breast exam her most recent mammogram.        Plan:       In consultation with the patient, I reviewed the findings of her physical exam.  There are no suspicious findings in either breast.  Her mammogram from October was normal. Unfortunately, I do not have a good explanation or resolution for  these sensations, but I reassured her that it was not suspicious for malignancy.  Her sensations are not typically painful, but we reviewed some of the possible recommendations for breast pain reduction.  She reports 2 caffeinated beverages per day.  She could try eliminating this from her diet.  She could also try supplementation with evening primrose oil and/or vitamin E.  Likely, this will be self limited and eventually resolved.    I provided her with a prescription for her annual mammogram, which is due in October 2016.  She will return at that time for repeat clinical breast exam, to ensure stability.         Procedures  none

## 2014-11-08 ENCOUNTER — Inpatient Hospital Stay
Payer: Medicare Other | Attending: Physical Medicine & Rehabilitation | Admitting: Rehabilitative and Restorative Service Providers"

## 2014-11-08 DIAGNOSIS — M545 Low back pain, unspecified: Secondary | ICD-10-CM

## 2014-11-08 NOTE — PT/OT Therapy Note (Signed)
DAILY NOTE  11/08/2014    Time In/Out: 7:46 - 8:28  Total Treatment Time: 42 minutes Visit Number: 9    Certification Status Ends: 12/05/2014 2x week for 10 weeks G-Codes required on visit # 10    9 of 18 medicare visits utilized    # of Authorized Visits: 20 Visit #: 9    Diagnosis (Treating/Medical):     ICD-10-CM    1. Midline low back pain without sciatica M54.5        Subjective:    Functional Changes:  Reports that her leg is getting stronger - doesn't shake as much anymore. Burning and tingling much less than before. The pain in the L lower back and sacrum isn't as bad like before.     Objective:   Treatment:  Therapeutic Exercise:   RCB x 6' with L8 resistance and large, random hills with subjective recorded  LP 90 lbs x 3' with cues for slow and controlled knee flexion  Cybex HS curls with 3 plates of resistance x 30 reps  TG L10 x 3' with cues for slow and controlled knee flexion    NMR:    n/a    Therapeutic Activities:   n/a    Manual Therapy:   ST mobs along L lumbar paraspinals  Sacral PA mobs throughout with FM's silent metronome  Bony contour clearing L ischial tuberosity as well as L1-L5 spinous processes  ST mobs L glutes  Sacral gapping L SI with FM's hip IR     Current Measurements (ROM, Strength, Girth, Outcomes, etc.):        Assessment (response to treatment):  Increased restriction L glutes and L ischial tuberosity which is contributing to patient's lower back pain. Patient with improved function since last seen with overall reduction in pain. Patient nearing discharge in 2-3 more visits.    Progress towards functional goals: see below     End of Certification   Date (Body Area, Impairment Goal, Functional   Activity, Target Performance) Time Frame Status Date/  Initial   09/18/2014 Pt will demo a functional squat with neutral LE's without c/o P! To return to squatting & lifting for ADL's daily without limitations 10 weeks In progress - improved form, however LE weakness  limiting  11/08/2014 [facility]   09/18/2014  Pt to demonstrate efficient weight shift into LEs with sit-> stand with pain < 1/10  10 weeks emphasized glute and qud strength for increased ease and control with sit to stand  [facility] 10/13/14   09/18/2014  Decrease Oswestry Low Back Pain Questionnaire to <18% to exceed Minimal Detectable Change (MDC) of 10 points. 10 weeks Osw 30% vs 32% @ IE no change  [facility] 10/13/14   09/18/2014  Improve LPM all directions to 3/5 or greater with good initiation 10 weeks     09/18/2014  Pt to demonstrate improved functional alignment in sitting/standing with EFT/VCT > 3/5 10 weeks EFT standing: 4/5 10/04/14 EBW      Patient requires continued skilled care to: progress functional strength and core strength to return to PLOF    Plan:  Continue with POC.    Chipper Oman, DPT [phone]  11/08/2014

## 2014-11-13 ENCOUNTER — Inpatient Hospital Stay: Payer: Medicare Other | Admitting: Physical Therapist

## 2014-11-13 DIAGNOSIS — M545 Low back pain, unspecified: Secondary | ICD-10-CM

## 2014-11-13 NOTE — PT/OT Plan of Care (Signed)
Plan of Care / Updated Plan of Care IPTC Medicare Provider #: [id number]                Patient Name: Michele [name]  MRN: [id number]  [facility] Medicare #: Medicare Sub. Num: [id number]  DOI: Onset of Problem / Injury: 09/11/14 DOS: NA    SOC: 09/18/2014     Diagnosis:     ICD-10-CM    1. Midline low back pain without sciatica M54.5          G Codes: Evaluation / Current: A2130 Mobility CJ (20-40) Goal: G8979 Mobility CI (1-20)  Primary Functional Deficit: Mobility: Walking and Moving Around G-codes determined by: Oswestry, subj, clinical evaluation    ASSESSMENT:   Mrs. Bentley has been seen for 10 sessions of Physical Therapy for low back pain. Treatment has worked to improve and manage any joint and soft tissue dysfunctions of low back and pelvis girdle as well as improve LE and core strength.Flexibility, mobility, and strength have all improved since the start of care.  Ms. Huxtable continues to report decreasing p! Levels and decreased functional limitations.     Frequency/Duration: 1 times a week for 1 weeks. Anticipated D/C date: 11/21/14    Certification Dates: From: 11/14/14 To: 11/21/14    Progress towards functional goals:      End of Certification   Date (Body Area, Impairment Goal, Functional   Activity, Target Performance) Time Frame Status Date/  Initial   09/18/2014 Pt will demo a functional squat with neutral LE's without c/o P! To return to squatting & lifting for ADL's daily without limitations 10 weeks In progress - improved form, however LE weakness limiting  11/08/2014 [facility]   09/18/2014  Pt to demonstrate efficient weight shift into LEs with sit-> stand with pain < 1/10  10 weeks MET  [facility] 11/13/14   09/18/2014  Decrease Oswestry Low Back Pain Questionnaire to <18% to exceed Minimal Detectable Change (MDC) of 10 points. 10 weeks Osw 24% vs 32% @ IE [facility] 11/13/14   09/18/2014  Improve LPM all directions to 3/5 or greater with good initiation 10 weeks      09/18/2014  Pt to demonstrate improved functional alignment in sitting/standing with EFT/VCT > 3/5 10 weeks EFT standing: 4/5 10/04/14 EBW        Plan: Cont x 1 session then d/c to HEP    Signature: Franki Monte, PT,  DPT, CFMT  [place] [address]  Date: 11/13/2014     Signature:Shapiro, Verdell Carmine,* ___________________________Date:       Patient Name: Michele [name]  MRN: [id number]

## 2014-11-13 NOTE — PT/OT Therapy Note (Signed)
DAILY NOTE  11/13/2014    Time In/Out: 145 - 225 Total Treatment Time: 40 mins Visit Number:  10    Certification Status Ends: Dec 05, 2014 2x week for 10 weeks G-Codes required on visit # 20    10 of 18 medicare visits utilized    # of Authorized Visits: 20 Visit #: 10      Diagnosis (Treating/Medical):     ICD-10-CM    1. Midline low back pain without sciatica M54.5            Subjective:    Functional Changes: The L leg still shakes if I bend it but if its straight its ok. Ive noticed achy p! Here (points to L GT) if I stand long and when I lay down. Why does the leg cramp?    Objective:   Treatment:  Therapeutic Exercise: to improve: Strength   Assisted Warm-up RCB seat 6 random L4 x 6 mins  Midrange uni LP 70 lbs x 2 mins  Midrange LP 90 lbs x 3 mins  10 sec holds TG l10 squats 3 mins total with 10 sec rest in between  Modifications/Patient Education: re-iterated educ of past session with this PT as well as other PTs involved in this patients care- potential weakness in muscle, pressure being placd on tissues during sleep, electrolyte or vitamin imbalances (Verbal, Visual and Tactile cues for form and execution during resisted activities and NMR)    NMR:  Pelvic PD with LE ext/ abd/IR with end range holds and COI to improve weight acceptance into LE with increased emphasis on mid range knee flexion holds for stability and strength with knees bent       Therapeutic Activities:  -----        Manual Therapy:   R SL bony contours L GT progressed to STM glutes, TFL, ITband  L hip distraction with abd with Cr to add Gr 4  L innominate abd with CR to add Gr 4        Current Measurements (ROM, Strength, Girth, Outcomes, etc.):   Outcomes Osw   Osw 24% vs 32% @ IE       Assessment (response to treatment):   Though pt demonstrated good mid-range knee flex strength and endurance during NMR no significant change noted with shaking in standing so moved to CKC strengthening with TE to promote mid range hamstring and quad  strength.    Progress towards functional goals:      End of Certification   Date (Body Area, Impairment Goal, Functional   Activity, Target Performance) Time Frame Status Date/  Initial   09/18/2014 Pt will demo a functional squat with neutral LE's without c/o P! To return to squatting & lifting for ADL's daily without limitations 10 weeks In progress - improved form, however LE weakness limiting  11/08/2014 [facility]   09/18/2014  Pt to demonstrate efficient weight shift into LEs with sit-> stand with pain < 1/10  10 weeks emphasized glute and qud strength for increased ease and control with sit to stand  [facility] 10/13/14   09/18/2014  Decrease Oswestry Low Back Pain Questionnaire to <18% to exceed Minimal Detectable Change (MDC) of 10 points. 10 weeks Osw 24% vs 32% @ IE [facility] 11/13/14   09/18/2014  Improve LPM all directions to 3/5 or greater with good initiation 10 weeks     09/18/2014  Pt to demonstrate improved functional alignment in sitting/standing with EFT/VCT > 3/5 10 weeks EFT standing: 4/5 10/04/14 EBW  G Codes: Evaluation / Current: [id number] Mobility CI (1-20) Goal: E4540 Mobility CI (1-20)  Primary Functional Deficit: Mobility: Walking and Moving Around G-codes determined by: Subjective reports, outcome measures, outcome scores        Patient requires continued skilled care to: progress emphasis on midrange strength of B LEs L > R to abate shaking with knee flexion moments    Plan:  Continue with Plan of Care and Adjustments/Modifications: Gcodes and POC updated and sent to MD    goals    Franki Monte, PT,  DPT, CFMT  [place] [phone]  11/13/2014

## 2014-11-15 ENCOUNTER — Inpatient Hospital Stay: Payer: Medicare Other | Admitting: Physical Therapist

## 2014-11-15 ENCOUNTER — Inpatient Hospital Stay: Payer: Medicare Other | Admitting: Rehabilitative and Restorative Service Providers"

## 2014-11-15 DIAGNOSIS — M545 Low back pain, unspecified: Secondary | ICD-10-CM

## 2014-11-15 NOTE — PT/OT Therapy Note (Signed)
DAILY NOTE  11/15/2014    Time In/Out: 1045 - 1125 Total Treatment Time: 40 mins Visit Number: 11    Certification Status Ends: 2014/11/23 2x week for 10 weeks G-Codes required on visit # 20    11 of 18 medicare visits utilized    # of Authorized Visits:   Visit #:        Diagnosis (Treating/Medical):     ICD-10-CM    1. Midline low back pain without sciatica M54.5            Subjective:    Functional Changes: Things are the same but I feel good.    Objective:   Treatment:  Therapeutic Exercise: to improve: Strength   Assisted Warm-up RCB random L4 x 6 mins  cybex machine TKE 2 plates x 2 mins  cybex machine 3.5 plates with 10 sec iso hold t/o range hamstring curls 5 mins total  Modifications/Patient Education:  (Verbal, Visual and Tactile cues )    NMR:  ----    Therapeutic Activities:  high hurdles leading with R then L then alternating then fwd and lateral step overs into squats, then step over into SLS x 4 trials each with cues for avoidance of circumduction and pure hip/knee flexion  Fwd/bwd lateral weight shifts on airex pad  perturbations on airex pad  Resisted weight shifting on airex pad   A/P/lateraltaps Fitter f/b balance holds              Manual Therapy:   ---      Current Measurements (ROM, Strength, Girth, Outcomes, etc.):   LPM: 3/5 all directions except R ext quadrant     Assessment (response to treatment):   Continued with emphasis on midrange hamstring and quad strengthening as well as balance for tonic muscle facilitation. Though shaking persists with standing knee flexion- Mrs. Ricardo has an HEP tailored to maximize gains in strength and progress carryover. She reports decreased functional limitations, increased strength, decreased p! And is appropriate for s/c at this time.     Progress towards functional goals:      End of Certification   Date (Body Area, Impairment Goal, Functional   Activity, Target Performance) Time Frame Status Date/  Initial   09/18/2014  Pt will demo a functional squat with neutral LE's without c/o P! To return to squatting & lifting for ADL's daily without limitations 10 weeks MET  [facility] 11/15/14   09/18/2014  Pt to demonstrate efficient weight shift into LEs with sit-> stand with pain < 1/10  10 weeks MET  [facility] 11/13/14   09/18/2014  Decrease Oswestry Low Back Pain Questionnaire to <18% to exceed Minimal Detectable Change (MDC) of 10 points. 10 weeks Osw 24% vs 32% @ IE [facility] 11/13/14   09/18/2014  Improve LPM all directions to 3/5 or greater with good initiation 10 weeks nearly met  [facility] 11/15/14   09/18/2014  Pt to demonstrate improved functional alignment in sitting/standing with EFT/VCT > 3/5 10 weeks EFT standing: 4/5 October 13, 2014 EBW          G Codes:   Goal: E4540 Mobility CI (1-20), Discharge: G8980 Mobility CI (1-20)   G-codes determined by Subjective reports, outcome measures, outcome scores        Plan:  Discharged from P.T./O.T. Goals Met    Franki Monte, PT,  DPT, CFMT  [place] [address]  11/15/2014

## 2014-11-16 ENCOUNTER — Telehealth: Payer: Medicare Other

## 2014-11-22 ENCOUNTER — Ambulatory Visit: Payer: Medicare Other

## 2014-11-22 NOTE — Progress Notes (Signed)
[facility] Nurse Navigator Note: Attempted to leave message with person who answered phone. Left name but was unable to leave detailed message or phone number. Patient has completed PT and new Modified Oswestry showing areas of improvement but also has areas of decline.

## 2014-12-13 ENCOUNTER — Ambulatory Visit: Payer: Medicare Other

## 2014-12-13 NOTE — Progress Notes (Signed)
[facility] Nurse Navigator Note: Patient is doing better per report. Complaining of body tremors that she is following up with a neurologist for. States she feels her back is doing better. Continues to decline spine surgeon referral as she feels she is able to do her activities of daily living.

## 2015-01-17 ENCOUNTER — Other Ambulatory Visit (INDEPENDENT_AMBULATORY_CARE_PROVIDER_SITE_OTHER): Payer: Self-pay

## 2015-01-17 DIAGNOSIS — K219 Gastro-esophageal reflux disease without esophagitis: Secondary | ICD-10-CM

## 2015-01-17 MED ORDER — OMEPRAZOLE 20 MG PO CPDR
DELAYED_RELEASE_CAPSULE | ORAL | Status: DC
Start: 2015-01-17 — End: 2015-01-24

## 2015-01-18 ENCOUNTER — Ambulatory Visit (INDEPENDENT_AMBULATORY_CARE_PROVIDER_SITE_OTHER): Payer: Medicare Other | Admitting: Family Medicine

## 2015-01-18 ENCOUNTER — Encounter (INDEPENDENT_AMBULATORY_CARE_PROVIDER_SITE_OTHER): Payer: Self-pay | Admitting: Family Medicine

## 2015-01-18 VITALS — BP 117/56 | HR 76 | Temp 99.0°F | Resp 14 | Ht 60.0 in | Wt 113.0 lb

## 2015-01-18 DIAGNOSIS — L989 Disorder of the skin and subcutaneous tissue, unspecified: Secondary | ICD-10-CM

## 2015-01-18 DIAGNOSIS — M8580 Other specified disorders of bone density and structure, unspecified site: Secondary | ICD-10-CM

## 2015-01-18 DIAGNOSIS — I8393 Asymptomatic varicose veins of bilateral lower extremities: Secondary | ICD-10-CM

## 2015-01-18 DIAGNOSIS — N898 Other specified noninflammatory disorders of vagina: Secondary | ICD-10-CM

## 2015-01-18 DIAGNOSIS — M542 Cervicalgia: Secondary | ICD-10-CM

## 2015-01-18 DIAGNOSIS — G8929 Other chronic pain: Secondary | ICD-10-CM

## 2015-01-18 DIAGNOSIS — I8390 Asymptomatic varicose veins of unspecified lower extremity: Secondary | ICD-10-CM

## 2015-01-18 DIAGNOSIS — R251 Tremor, unspecified: Secondary | ICD-10-CM

## 2015-01-18 DIAGNOSIS — D693 Immune thrombocytopenic purpura: Secondary | ICD-10-CM

## 2015-01-18 DIAGNOSIS — M549 Dorsalgia, unspecified: Secondary | ICD-10-CM

## 2015-01-18 DIAGNOSIS — K22719 Barrett's esophagus with dysplasia, unspecified: Secondary | ICD-10-CM

## 2015-01-18 NOTE — Progress Notes (Signed)
Subjective:       Patient ID: Michele [name] is a 74 y.o. female.    HPI     . 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  Does not want to have surgery also episode shaking , pending Neurology consult  MRI There is mild levoscoliotic deformity. The vertebral bodies are normalin height and configuration. There are no findings to suggest an acute fractures. L1-L2: There is minimal disc bulge but no significant compromise of the  central canal or neuroforamina.L2-L3: There is disc degeneration with disc height preservation. There is diffuse disc bulge with bilateral facet joint hypertrophic changesresulting in moderate degree of central canal and bilateral foramina stenosis, stable. There is small, left foraminal annular tear.L3-L4: There is disc degeneration with disc height loss, stable. There is broad-based posterior osteophyte disc bulge complex with bilateral facet joint hypertrophic changes resulting in moderate to severe central canal and bilateral foramina stenosis, stable L4-L5: There is disc degeneration with disc height preservation. There is diffuse disc bulge, ligamentum flavum thickening and bilateral facetjoint hypertrophic changes resulting in severe central canal and bilateral foramina stenosis, stable. There is left foraminal annular tear.L5-S1: There is disc degeneration with disc height preservation. There is minimal disc bulge with bilateral facet joint hypertrophic changes resulting in mild bilateral neuroforamina stenosis with small, right  foraminal annular tear, stable.The conus tip is seen at L1-L2, and has normal morphology and signal  intensity. There are no paraspinal masses or collections, also episode shaking ,     . Osteopenia      Taking Calcium and vit D      . Neck pain:   Stable       MRI cervical spine in 2011 shows Multilevel degenerative changes of the cervical spine resulting in marked, central canal and bilateral neural foraminal stenoses at multiple levels.       . Thrombocytopenia.. : stable , immune   Lab Results   Component Value Date    WBC 3.57 10/18/2014    HGB 13.5 10/18/2014    HCT 42.5 10/18/2014    MCV 94.7 10/18/2014    PLT 141 10/18/2014        family history disease : father died at 12 years ,    Social; house cleaning still working part time , married, 4 chidren, from [place]  The following portions of the patient's history were reviewed  The following portions of the patient's history were reviewed and updated as appropriate:   She  has a past medical history of Gastroesophageal reflux disease; Barrett's esophagus; Abnormal EKG; Arthralgia; Cervical spinal stenosis; Osteopenia; Depression; Colonic polyp; Osteoarthritis; Lower back pain; and Disc disorder.  She  does not have any pertinent problems on file.  She  has past surgical history that includes Appendectomy (1960?) and Tubal ligation (1973).  Her family history is negative for Breast cancer, Ovarian cancer, and Cancer.  She  reports that she has been smoking Cigarettes.  She has a 25 pack-year smoking history. She uses smokeless tobacco. She reports that she does not drink alcohol or use illicit drugs.  She has a current medication list which includes the following prescription(s): cholecalciferol, gabapentin, multivitamin, and omeprazole.  Current Outpatient Prescriptions on  File Prior to Visit   Medication Sig Dispense Refill   . Cholecalciferol (VITAMIN D PO) Take by mouth.     . Multiple Vitamin (MULTIVITAMIN) tablet Take 1 tablet by mouth daily.     Marland Kitchen omeprazole (PRILOSEC) 20 MG capsule TAKE 1 CAPSULE (20 MG TOTAL) BY MOUTH DAILY. 90 capsule 1     No current facility-administered medications on file prior to visit.     She has No Known Allergies..    Review of Systems   Cardiovascular: Negative for  palpitations.   Musculoskeletal: Positive for arthralgias.           Objective:    Physical Exam   Constitutional: She appears well-developed and well-nourished.   HENT:   Head: Normocephalic and atraumatic.   Cardiovascular: Normal rate, regular rhythm and normal heart sounds.  Exam reveals no gallop and no friction rub.    No murmur heard.  Pulmonary/Chest: No respiratory distress. She has no wheezes. She has no rales. She exhibits no tenderness.   Skin:   Irregular mole right arm, ganglionic cyst right hand   breast: no predominant masse,         Assessment/plan :     1. Shaking    - Ambulatory referral to Neurology    2. Osteopenia  Vit D and calcium     3. Neck pain  satble    4. Idiopathic thrombocytopenia purpura    - CBC and differential    5. Chronic back pain    - Ambulatory referral to Neurology    6. Barrett's esophagus with dysplasia  Continue Prilosec     7. Varicose vein of leg    - Ambulatory referral to Vascular Surgery    8. Skin lesion    - Ambulatory referral to Dermatology    Procedures

## 2015-01-18 NOTE — Telephone Encounter (Signed)
Pt made aware

## 2015-01-18 NOTE — Progress Notes (Signed)
1. Have you self referred yourself since we last saw you? No  Refer to care team   Or   Add specialists:

## 2015-01-22 ENCOUNTER — Ambulatory Visit
Admission: RE | Admit: 2015-01-22 | Discharge: 2015-01-22 | Disposition: A | Discharge status: Home / Self Care | Payer: Medicare Other | Source: Ambulatory Visit | Attending: Family Medicine | Admitting: Family Medicine

## 2015-01-22 DIAGNOSIS — D693 Immune thrombocytopenic purpura: Secondary | ICD-10-CM | POA: Insufficient documentation

## 2015-01-22 LAB — CBC AND DIFFERENTIAL
Basophils Absolute Automated: 0.02 10*3/uL (ref 0.00–0.20)
Basophils Automated: 1 %
Eosinophils Absolute Automated: 0.06 10*3/uL (ref 0.00–0.70)
Eosinophils Automated: 2 %
Hematocrit: 39.9 % (ref 37.0–47.0)
Hgb: 12.8 g/dL (ref 12.0–16.0)
Immature Granulocytes Absolute: 0.01 10*3/uL
Immature Granulocytes: 0 %
Lymphocytes Absolute Automated: 1.39 10*3/uL (ref 0.50–4.40)
Lymphocytes Automated: 38 %
MCH: 30.1 pg (ref 28.0–32.0)
MCHC: 32.1 g/dL (ref 32.0–36.0)
MCV: 93.9 fL (ref 80.0–100.0)
MPV: 12.4 fL — ABNORMAL HIGH (ref 9.4–12.3)
Monocytes Absolute Automated: 0.32 10*3/uL (ref 0.00–1.20)
Monocytes: 9 %
Neutrophils Absolute: 1.83 10*3/uL (ref 1.80–8.10)
Neutrophils: 50 %
Nucleated RBC: 0 /100 WBC (ref 0–1)
Platelets: 135 10*3/uL — ABNORMAL LOW (ref 140–400)
RBC: 4.25 10*6/uL (ref 4.20–5.40)
RDW: 13 % (ref 12–15)
WBC: 3.63 10*3/uL (ref 3.50–10.80)

## 2015-01-24 ENCOUNTER — Encounter (INDEPENDENT_AMBULATORY_CARE_PROVIDER_SITE_OTHER): Payer: Self-pay | Admitting: Neurology

## 2015-01-24 ENCOUNTER — Ambulatory Visit (INDEPENDENT_AMBULATORY_CARE_PROVIDER_SITE_OTHER): Payer: Medicare Other | Admitting: Neurology

## 2015-01-24 VITALS — BP 120/68 | HR 88

## 2015-01-24 DIAGNOSIS — M4804 Spinal stenosis, thoracic region: Secondary | ICD-10-CM

## 2015-01-24 DIAGNOSIS — M542 Cervicalgia: Secondary | ICD-10-CM

## 2015-01-24 DIAGNOSIS — R258 Other abnormal involuntary movements: Secondary | ICD-10-CM

## 2015-01-24 DIAGNOSIS — M549 Dorsalgia, unspecified: Secondary | ICD-10-CM

## 2015-01-24 DIAGNOSIS — M79604 Pain in right leg: Secondary | ICD-10-CM

## 2015-01-24 DIAGNOSIS — M79605 Pain in left leg: Secondary | ICD-10-CM

## 2015-01-24 DIAGNOSIS — M4802 Spinal stenosis, cervical region: Secondary | ICD-10-CM

## 2015-01-31 NOTE — Progress Notes (Signed)
Subjective:       Patient ID: Michele [name] is a 74 y.o. female.    CC: tremors    HPI  A 74 year old female presenting to neurology clinic for evaluation of  tremors.  The patient says for the past 2 years she has noticed tremors,  mainly in her legs while standing.  Does not typically happen when sitting  and usually not occurring when walking around.  However, if she is standing  still, especially if she puts pressure on her legs, her left leg will start  to shake in particular and sometimes it will lead to her whole body  shaking.  She feels that it is much worse in her left leg as compared to  her right leg.  She denies any specific numbness.  She denies any specific  weakness.  No falls and feels like she is walking okay.  The shaking again  seems to mainly be when she is standing still.  She has a long history of  back pain with burning and tingling down into both legs.  She has no  shooting pain down the legs.  No specific neck pain, but does feel a bit  stiff.  No shooting pain down the arms.  No saddle anesthesia.  No loss of  bowel or bladder control.  No specific rigidity in arms or legs.  Does not  get dizzy when standing.  No specific change in gait.  No changes in face.        The following portions of the patient's history were reviewed and updated as appropriate: allergies, current medications, past family history, past medical history, past social history, past surgical history and problem list.    Review of Systems  Per HPI.  No recent illness.  Denies fever, chills, cough, sinus pain, eye pain, eye redness, ear pain, rhinorrhea, sore throat, chest pain, SOB, wheezing, abd pain, Nausea, Vomiting, diarrhea, constipation, dysuria, or rashes.  All other ROS negative.         Objective:    Physical Exam  BP 120/68 mmHg  Pulse 88    Constitutional: NAD   Eyes: Clear conjunctiva  Neck: Nontender, full ROM, No Lhermitte's sign  Cardiovascular: RRR  Resipratory: CTA B   Musculoskeletal: Normal muscle  bulk.  No muscle pain  Integumentary: No abnormal rash noted  Psych: Normal affect    Neurological:   Mental Status: Alert & oriented to person, place, month, & year.   Language: Spontaneous & fluent speech, good comprehension.    Cranial Nerves:  II, III: PERRL, VFF  III, IV, VI: EOMI, No nystagmus, no palsies, no ptosis  V: Intact to LT V1-V3 distribution bilaterally.   VI: Symmetrical face and expression.   VIII: Hearing intact to finger rub bilaterally.   IX, X: Palate/Uvula elevates symmetrically.   XI: 5/5 Trapezius & SCM bilaterally.   XII: Tongue is midline.     Motor Exam: Normal bulk and tone. No clonus. No pronator drift. Strength 5/5 throughout and symmetric.    When standing with pressure on the right leg she has a high-amplitude  shaking in the left leg that can affect her whole body.  When the leg is  relaxed or she is standing on her right leg, it is almost still.  No  noticeable tremor when standing still with both legs more relaxed.  No  notable tremor in arms.  Some increased tone in the legs with a slight  spastic catch.  Did not have  any obvious clonus in her ankles.        Sensation: Sensation to LT intact grossly throughout symmetrically. Romberg negative    Cerebellar:   Finger to Nose: intact bilaterally w/ no dysmetria or tremor    Reflexes: 2+ throughout, symmetric, with down-going toes.    Station/ Gait: Well balanced and stable. Normal foot width. Able to perform heels, toes, and tandem gait. Normal arm swing.        Assessment:       The shaking in her leg seems to be very specific in the times it occurs.   It does not appear when her legs are relaxed or when she is walking.   However, when her left leg flexed, it is very high amplitude shaking, very  reminiscent of clonus.  She did not have clonus in her ankles.  She does have  somewhat brisk reflexes but symmetric.  Downgoing toes bilaterally.  It is  possible to have orthostatic tremor, but it is usually much smaller  amplitude as  compared to this.  The tone in her legs is not rigid but does  have a bit of a spastic catch.  My concern is that this could be  myelopathic either related to the cervical or thoracic spine and this may  be spasticity as opposed to rigidity or true tremor.          Plan:      Procedures    MRI cervical spine  MRI thoracic spine.     More than 50% of this 45 min office visit was spent counseling the patient on one or more of the following:   Disease state - discussion about the medical condition, diagnostic and treatment options   Medications - indications and side effects   Lifestyle issues as appropriate.

## 2015-02-05 ENCOUNTER — Ambulatory Visit
Admission: RE | Admit: 2015-02-05 | Discharge: 2015-02-05 | Disposition: A | Discharge status: Home / Self Care | Payer: Medicare Other | Source: Ambulatory Visit | Attending: Neurology | Admitting: Neurology

## 2015-02-05 DIAGNOSIS — M79605 Pain in left leg: Secondary | ICD-10-CM | POA: Insufficient documentation

## 2015-02-05 DIAGNOSIS — M47892 Other spondylosis, cervical region: Secondary | ICD-10-CM | POA: Insufficient documentation

## 2015-02-05 DIAGNOSIS — M542 Cervicalgia: Secondary | ICD-10-CM

## 2015-02-05 DIAGNOSIS — M79604 Pain in right leg: Secondary | ICD-10-CM | POA: Insufficient documentation

## 2015-02-05 DIAGNOSIS — M2578 Osteophyte, vertebrae: Secondary | ICD-10-CM | POA: Insufficient documentation

## 2015-02-05 DIAGNOSIS — M4802 Spinal stenosis, cervical region: Secondary | ICD-10-CM | POA: Insufficient documentation

## 2015-02-05 DIAGNOSIS — R258 Other abnormal involuntary movements: Secondary | ICD-10-CM | POA: Insufficient documentation

## 2015-02-06 ENCOUNTER — Ambulatory Visit (INDEPENDENT_AMBULATORY_CARE_PROVIDER_SITE_OTHER): Payer: Medicare Other | Admitting: Specialist

## 2015-02-06 ENCOUNTER — Encounter (INDEPENDENT_AMBULATORY_CARE_PROVIDER_SITE_OTHER): Payer: Self-pay | Admitting: Specialist

## 2015-02-06 ENCOUNTER — Encounter (INDEPENDENT_AMBULATORY_CARE_PROVIDER_SITE_OTHER): Payer: Self-pay

## 2015-02-06 VITALS — BP 108/68 | HR 79 | Ht 62.0 in | Wt 114.0 lb

## 2015-02-06 DIAGNOSIS — R202 Paresthesia of skin: Secondary | ICD-10-CM | POA: Insufficient documentation

## 2015-02-06 DIAGNOSIS — R200 Anesthesia of skin: Secondary | ICD-10-CM

## 2015-02-06 HISTORY — DX: Paresthesia of skin: R20.2

## 2015-02-06 HISTORY — DX: Anesthesia of skin: R20.0

## 2015-02-06 NOTE — Progress Notes (Signed)
[facility]    Chief Complaint   Patient presents with   . Advice Only     VARICOSE VEINS         History of Present Illness     Lallie Strahm is a 74 y.o. female who presents with burning and tingling of both lower extremities for many years.  Patient denies having any claudication or ischemic rest pain.  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  Patient wants to make sure there is no vascular reason for her symptoms    Past Medical History     Past Medical History   Diagnosis Date   . Gastroesophageal reflux disease    . Barrett's esophagus    . Abnormal EKG    . Arthralgia    . Cervical spinal stenosis    . Osteopenia    . Depression    . Colonic polyp    . Osteoarthritis    . Lower back pain    . Disc disorder    . Numbness and tingling of both legs 02/06/2015       Allergies     No Known Allergies    Medications     Current Outpatient Prescriptions on File Prior to Visit   Medication Sig Dispense Refill   . Cholecalciferol (VITAMIN D PO) Take by mouth.     . [DISCONTINUED] Multiple Vitamin (MULTIVITAMIN) tablet Take 1 tablet by mouth daily.       No current facility-administered medications on file prior to visit.       Review of Systems     Constitutional: Negative for fevers and chills  Skin: No rash or lesions  Respiratory: Negative for cough, wheezing, or hemoptysis  Cardiovascular: as per HPI  Gastrointestinal: Negative for abdominal pain, nausea, vomiting and diarrhea  Musculoskeletal:  No arthritic symptoms  Genitourinary: Negative for dysuria  All other systems were reviewed and are negative except what is stated in the HPI    Physical Exam     Filed Vitals:    02/06/15 1439   BP: 108/68   Pulse: 79       Body mass index is 20.85 kg/(m^2).    General: Patient appears their stated age, well-nourished. Alert and in no apparent distress.  Lungs: Respiratory effort unlabored, chest expansion symmetric.  Cardiac: RRR, no carotid bruits, no JVD.   Extremities warm, Right Femoral pulses 2+,  Left Femoral 2+, Right popliteal 2+, Left popliteal 2+, Right DP 2+, Left DP 2+, Right PT 2+, Left PT 2+,   Abd: Soft, nondistended, nontender. No guarding or rebound, No mid line pulsatile mass   OHY:WVPX ROM in all 4 extremities, symmetric some mild varicosities  Skin: Color appropriate for race, Skin warm, dry, no gangrene, no non healing ulcers, has varicose veins , no hyperpigmentation, no lipo-dermatosclerosis  Neuro: Good insight and judgment, oriented to person, place, and time CN II-XII intact, gross motor and sensory intact      Labs     CBC:   WBC   Date/Time Value Ref Range Status   01/22/2015 09:55 AM 3.63 3.50 - 10.80 x10 3/uL Final   05/09/2014 09:25 AM 4.6  Final   08/30/2013 02:36 PM 4.7  Final     RBC   Date/Time Value Ref Range Status   01/22/2015 09:55 AM 4.25 4.20 - 5.40 x10 6/uL Final   05/09/2014 09:25 AM 4.32  Final     HEMOGLOBIN   Date/Time Value Ref Range Status  10/17/2013 12:00 AM 12.7 11.7 - 15.5 g/dL Final     HGB   Date/Time Value Ref Range Status   01/22/2015 09:55 AM 12.8 12.0 - 16.0 g/dL Final     HGB POCT   Date/Time Value Ref Range Status   05/09/2014 09:25 AM 13.1 12 - 15.5 g/dL Final     HEMATOCRIT   Date/Time Value Ref Range Status   01/22/2015 09:55 AM 39.9 37.0 - 47.0 % Final   05/09/2014 09:25 AM 39.5  Final     MCV   Date/Time Value Ref Range Status   01/22/2015 09:55 AM 93.9 80.0 - 100.0 fL Final   05/09/2014 09:25 AM 91.4  Final     MCHC   Date/Time Value Ref Range Status   01/22/2015 09:55 AM 32.1 32.0 - 36.0 g/dL Final   16/04/9603 54:09 AM 33.3  Final     RDW   Date/Time Value Ref Range Status   01/22/2015 09:55 AM 13 12 - 15 % Final     PLATELETS   Date/Time Value Ref Range Status   01/22/2015 09:55 AM 135* 140 - 400 x10 3/uL Final   10/17/2013 12:00 AM 144 140 - 400 Thousand/uL Final       CMP:   SODIUM   Date/Time Value Ref Range Status   10/18/2014 08:55 AM 142 135 - 146 mEq/L Final     POTASSIUM   Date/Time Value Ref Range Status   10/18/2014 08:55 AM 4.2 3.5  - 5.3 mEq/L Final     CHLORIDE   Date/Time Value Ref Range Status   10/18/2014 08:55 AM 105 100 - 111 mEq/L Final   10/17/2013 12:00 AM 106 98 - 110 mmol/L Final     CO2   Date/Time Value Ref Range Status   10/18/2014 08:55 AM 31* 21 - 30 mEq/L Final     GLUCOSE   Date/Time Value Ref Range Status   10/18/2014 08:55 AM 87 70 - 100 mg/dL Final     Comment:     Interpretive Data for Adult Female and Female Population  Indeterminate Range:  100-125 mg/dL  Equal to or greater than 126 mg/dL meets the ADA  guidelines for Diabetes Mellitus diagnosis if symptoms  are present and confirmed by repeat testing.  Random (Non-Fasting)Interpretive Data (Adults):  Equal to or greater than 200 mg/dL meets the ADA  guidelines for Diabetes Mellitus diagnosis if symptoms  are present and confirmed by Fasting Glucose or GTT.       BUN   Date/Time Value Ref Range Status   10/18/2014 08:55 AM 15.0 7.0 - 19.0 mg/dL Final     PROTEIN, TOTAL   Date/Time Value Ref Range Status   10/18/2014 08:55 AM 6.5 6.0 - 8.3 g/dL Final   81/19/1478 29:56 AM 6.5 6.1 - 8.1 G/DL Final     ALKALINE PHOSPHATASE   Date/Time Value Ref Range Status   10/18/2014 08:55 AM 66 37 - 106 U/L Final     AST (SGOT)   Date/Time Value Ref Range Status   10/18/2014 08:55 AM 19 5 - 34 U/L Final     ALT   Date/Time Value Ref Range Status   10/18/2014 08:55 AM 13 0 - 55 U/L Final       Lipid Panel   CHOLESTEROL   Date/Time Value Ref Range Status   12/20/2013 07:25 AM 194 0 - 199 mg/dL Final     TRIGLYCERIDES   Date/Time Value Ref Range Status   12/20/2013 07:25 AM  52 34 - 149 mg/dL Final   21/30/8657 84:69 AM 60 <150 MG/DL Final     Comment:     For patients >18 years of age, the reference limit  for Creatinine is approximately 13% higher for people  identified as African-American.     HDL   Date/Time Value Ref Range Status   12/20/2013 07:25 AM 68 >40 mg/dL Final     Comment:     HDL:       Less than 40 mg/dL - Major risk heart disease       Greater than or equal to 60 mg/dL -  Negative risk factor       for heart disease         Coags: No results found for: PT, INR, PTT    Assessment and Plan       1. 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  Vascular-wise, she has excellent pulses in both lower extremities and a few varicose veins are not the cause of her symptoms.  This was discussed with the patient in detail and I will be seeing her in future as needed.    Dr. Wyvonnia Lora like to thank you for allowing me to participate in the care of this patient.    With regards

## 2015-02-07 ENCOUNTER — Ambulatory Visit (INDEPENDENT_AMBULATORY_CARE_PROVIDER_SITE_OTHER): Payer: Medicare Other | Admitting: Neurology

## 2015-02-16 ENCOUNTER — Ambulatory Visit: Payer: Medicare Other

## 2015-02-19 ENCOUNTER — Ambulatory Visit: Payer: Medicare Other

## 2015-02-19 NOTE — Progress Notes (Signed)
[facility] Nurse Navigator Note: Sent 6 month Modified Oswestry Outcomes and Satisfaction letter.

## 2015-02-26 ENCOUNTER — Ambulatory Visit: Payer: Medicare Other

## 2015-02-28 ENCOUNTER — Ambulatory Visit (INDEPENDENT_AMBULATORY_CARE_PROVIDER_SITE_OTHER): Payer: Medicare Other | Admitting: Neurology

## 2015-02-28 ENCOUNTER — Ambulatory Visit
Admission: RE | Admit: 2015-02-28 | Discharge: 2015-02-28 | Disposition: A | Discharge status: Home / Self Care | Payer: Medicare Other | Source: Ambulatory Visit | Attending: Neurology | Admitting: Neurology

## 2015-02-28 DIAGNOSIS — G9589 Other specified diseases of spinal cord: Secondary | ICD-10-CM | POA: Insufficient documentation

## 2015-02-28 DIAGNOSIS — M4804 Spinal stenosis, thoracic region: Secondary | ICD-10-CM | POA: Insufficient documentation

## 2015-02-28 DIAGNOSIS — M5104 Intervertebral disc disorders with myelopathy, thoracic region: Secondary | ICD-10-CM | POA: Insufficient documentation

## 2015-02-28 DIAGNOSIS — D1809 Hemangioma of other sites: Secondary | ICD-10-CM | POA: Insufficient documentation

## 2015-02-28 DIAGNOSIS — M79605 Pain in left leg: Secondary | ICD-10-CM | POA: Insufficient documentation

## 2015-02-28 DIAGNOSIS — R258 Other abnormal involuntary movements: Secondary | ICD-10-CM | POA: Insufficient documentation

## 2015-02-28 DIAGNOSIS — M5134 Other intervertebral disc degeneration, thoracic region: Secondary | ICD-10-CM | POA: Insufficient documentation

## 2015-02-28 DIAGNOSIS — M549 Dorsalgia, unspecified: Secondary | ICD-10-CM | POA: Insufficient documentation

## 2015-02-28 DIAGNOSIS — M79604 Pain in right leg: Secondary | ICD-10-CM | POA: Insufficient documentation

## 2015-03-05 ENCOUNTER — Ambulatory Visit: Payer: Medicare Other

## 2015-03-15 ENCOUNTER — Ambulatory Visit (INDEPENDENT_AMBULATORY_CARE_PROVIDER_SITE_OTHER): Payer: Medicare Other | Admitting: Neurology

## 2015-03-15 VITALS — Ht 65.0 in | Wt 150.0 lb

## 2015-03-15 DIAGNOSIS — G959 Disease of spinal cord, unspecified: Secondary | ICD-10-CM

## 2015-03-15 NOTE — Progress Notes (Signed)
Subjective:       Patient ID: Michele [name] is a 74 y.o. female.    CC: tremors    Leg Pain       A 74 year old female presenting to neurology clinic for f/u of  tremors.  No major changes since last appointment.  Still with shaking in her legs while standing.  No specific numbness or weakness.  She does have some pain in her left hip extending into her left leg.  No loss of bowel or bladder control.  No gait instability.  If she stands for long enough, her whole body will shake along with her legs.        The following portions of the patient's history were reviewed and updated as appropriate: allergies, current medications, past family history, past medical history, past social history, past surgical history and problem list.    Review of Systems    Per HPI.  No recent illness.  Denies fever, chills, cough, sinus pain, eye pain, eye redness, ear pain, rhinorrhea, sore throat, chest pain, SOB, wheezing, abd pain, Nausea, Vomiting, diarrhea, constipation, dysuria, or rashes.  All other ROS negative.         Objective:    Physical Exam    Ht 1.651 m (5\' 5" )  Wt 68.04 kg (150 lb)  BMI 24.96 kg/m2    Constitutional: NAD   Eyes: Clear conjunctiva  Neck: Nontender, full ROM, No Lhermitte's sign  Cardiovascular: RRR  Resipratory: CTA B   Musculoskeletal: Normal muscle bulk.  No muscle pain  Integumentary: No abnormal rash noted  Psych: Normal affect    Neurological:   Mental Status: Alert & oriented to person, place, month, & year.   Language: Spontaneous & fluent speech, good comprehension.    Cranial Nerves:  II, III: PERRL, VFF  III, IV, VI: EOMI, No nystagmus, no palsies, no ptosis  V: Intact to LT V1-V3 distribution bilaterally.   VI: Symmetrical face and expression.   VIII: Hearing intact to finger rub bilaterally.   IX, X: Palate/Uvula elevates symmetrically.   XI: 5/5 Trapezius & SCM bilaterally.   XII: Tongue is midline.     Motor Exam: Normal bulk and tone. No clonus. No pronator drift. Strength 5/5 throughout  and symmetric.    When standing with pressure on the right leg she has a high-amplitude  shaking in the left leg that can affect her whole body.  When the leg is  relaxed or she is standing on her right leg, it is almost still.  No  noticeable tremor when standing still with both legs more relaxed.  No  notable tremor in arms.  Some increased tone in the legs with a slight  spastic catch.  Did not have any obvious clonus in her ankles.        Sensation: Sensation to LT intact grossly throughout symmetrically. Romberg negative    Cerebellar:   Finger to Nose: intact bilaterally w/ no dysmetria or tremor    Reflexes: 2+ throughout, symmetric, with down-going toes.    Station/ Gait: Well balanced and stable. Normal foot width. Able to perform heels, toes, and tandem gait. Normal arm swing.        Assessment:         The MRI of her cervical spine showed multilevel of stenosis, with some cord compression around C5/C6 and C6/C7.  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  It was noted that the MRI is worse than it was in 2011.  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    There is certainly concern that the cervical compression could be producing the shaking in her legs.  Fortunately, it is not worsened since last appointment.    I recommended that she will he speak with the neurosurgeon.  At some point, she will likely require surgery.  If it continues to worsen    As stated previously, the shaking in her leg seems to be very specific to when her legs are active and muscles flexed. It does not appear when her legs are relaxed or when she is walking.   However, when her left leg flexed, it is very high amplitude shaking, very  reminiscent of clonus.  She did not have clonus in her ankles.  She does have  somewhat brisk reflexes but symmetric.  Downgoing toes bilaterally.  It is  possible to that  this could be orthostatic tremor, but it is usually much smaller  amplitude as compared to this.  The tone in her legs is not rigid but does  have a bit of a spastic catch.            Plan:      Procedures      Referral to neurosurgery    More than 50% of this office visit was spent counseling the patient on one or more of the following:   Disease state - discussion about the medical condition, diagnostic and treatment options   Medications - indications and side effects   Lifestyle issues as appropriate.

## 2015-05-12 ENCOUNTER — Other Ambulatory Visit (INDEPENDENT_AMBULATORY_CARE_PROVIDER_SITE_OTHER): Payer: Self-pay | Admitting: Surgery

## 2015-05-12 ENCOUNTER — Ambulatory Visit
Admission: RE | Admit: 2015-05-12 | Discharge: 2015-05-12 | Disposition: A | Discharge status: Home / Self Care | Payer: Medicare Other | Source: Ambulatory Visit | Attending: Surgery | Admitting: Surgery

## 2015-05-12 DIAGNOSIS — Z1239 Encounter for other screening for malignant neoplasm of breast: Secondary | ICD-10-CM

## 2015-05-12 DIAGNOSIS — Z1231 Encounter for screening mammogram for malignant neoplasm of breast: Secondary | ICD-10-CM | POA: Insufficient documentation

## 2015-07-12 ENCOUNTER — Other Ambulatory Visit (INDEPENDENT_AMBULATORY_CARE_PROVIDER_SITE_OTHER): Payer: Self-pay | Admitting: Family Medicine

## 2015-09-07 ENCOUNTER — Ambulatory Visit (INDEPENDENT_AMBULATORY_CARE_PROVIDER_SITE_OTHER): Payer: Medicare Other | Admitting: Family Medicine

## 2015-09-07 VITALS — BP 112/70 | HR 90 | Temp 98.0°F | Ht 63.0 in | Wt 115.0 lb

## 2015-09-07 DIAGNOSIS — R202 Paresthesia of skin: Secondary | ICD-10-CM

## 2015-09-07 DIAGNOSIS — J301 Allergic rhinitis due to pollen: Secondary | ICD-10-CM

## 2015-09-07 DIAGNOSIS — M542 Cervicalgia: Secondary | ICD-10-CM

## 2015-09-07 DIAGNOSIS — R251 Tremor, unspecified: Secondary | ICD-10-CM

## 2015-09-07 DIAGNOSIS — M5440 Lumbago with sciatica, unspecified side: Secondary | ICD-10-CM

## 2015-09-07 DIAGNOSIS — G8929 Other chronic pain: Secondary | ICD-10-CM

## 2015-09-07 DIAGNOSIS — K2270 Barrett's esophagus without dysplasia: Secondary | ICD-10-CM

## 2015-09-07 DIAGNOSIS — D709 Neutropenia, unspecified: Secondary | ICD-10-CM

## 2015-09-07 DIAGNOSIS — R200 Anesthesia of skin: Secondary | ICD-10-CM

## 2015-09-07 MED ORDER — LORATADINE 10 MG PO TABS
10.0000 mg | ORAL_TABLET | Freq: Every day | ORAL | Status: DC
Start: 2015-09-07 — End: 2018-04-21

## 2015-09-07 MED ORDER — SALINE NASAL SPRAY 0.65 % NA SOLN
1.0000 | NASAL | Status: DC | PRN
Start: 2015-09-07 — End: 2017-03-26

## 2015-09-07 MED ORDER — BENZONATATE 100 MG PO CAPS
100.0000 mg | ORAL_CAPSULE | Freq: Three times a day (TID) | ORAL | Status: DC | PRN
Start: 2015-09-07 — End: 2015-11-12

## 2015-09-07 NOTE — Progress Notes (Signed)
Subjective:       Patient ID: Michele [name] is a 75 y.o. female.    Cough  This is a new problem. The current episode started in the past 7 days. The problem has been unchanged. The cough is non-productive. Associated symptoms include headaches, nasal congestion, rhinorrhea and a sore throat. Pertinent negatives include no chest pain, chills, postnasal drip or wheezing. Nothing aggravates the symptoms. She has tried nothing for the symptoms. There is no history of environmental allergies.        . 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  Does not want to have surgery also episode shaking ,  Dr Rob Bunting Neurology consult  MRI  Lumbar spine There is mild levoscoliotic deformity. The vertebral bodies are normalin height and configuration. There are no findings to suggest an acute fractures. L1-L2: There is minimal disc bulge but no significant compromise of the  ventral canal or neuroforamina.L2-L3: There is disc degeneration with disc height preservation. There is diffuse disc bulge with bilateral facet joint hypertrophic changesresulting in moderate degree of central canal and bilateral foramina stenosis, stable. There is small, left foraminal annular tear.L3-L4: There is disc degeneration with disc height loss, stable. There is broad-based posterior osteophyte disc bulge complex with bilateral facet joint hypertrophic changes resulting in moderate to severe central canal and bilateral foramina stenosis, stable L4-L5: There is disc degeneration with disc height preservation. There is diffuse disc bulge, ligamentum flavum thickening and bilateral facetjoint hypertrophic changes resulting in severe central canal and bilateral foramina stenosis, stable.  There is left foraminal annular tear.L5-S1: There is disc degeneration with disc height preservation. There is minimal disc bulge with bilateral facet joint hypertrophic changes resulting in mild bilateral neuroforamina stenosis with small, right  foraminal annular tear, stable.The conus tip is seen at L1-L2, and has normal morphology and signal  intensity. There are no paraspinal masses or collections, also episode shaking ,     . Osteopenia      Taking Calcium and vit D      . Neck pain:  Stable       MRI cervical spine in 2011 shows Multilevel degenerative changes of the cervical spine resulting in marked, central canal and bilateral neural foraminal stenoses at multiple levels.       . Thrombocytopenia.. : stable , immune   Lab Results   Component Value Date    WBC 3.63 01/22/2015    HGB 12.8 01/22/2015    HCT 39.9 01/22/2015    MCV 93.9 01/22/2015    PLT 135* 01/22/2015        family history disease : father died at 22 years ,    Social; house cleaning still working part time , married, 4 chidren, from [place]  The following portions of the patient's history were reviewed  The following portions of the patient's history were reviewed and updated as appropriate:   She  has a past medical history of Gastroesophageal reflux disease; Barrett's esophagus; Abnormal EKG; Arthralgia; Cervical spinal stenosis; Osteopenia; Depression; Colonic polyp; Osteoarthritis; Lower back pain; Disc disorder; and Numbness and tingling of both legs (02/06/2015).  She  does not have any pertinent problems  on file.  She  has past surgical history that includes Appendectomy (1960?) and Tubal ligation (1973).  Her family history is negative for Breast cancer, Ovarian cancer, and Cancer.  She  reports that she has been smoking Cigarettes.  She has a 25 pack-year smoking history. She uses smokeless tobacco. She reports that she does not drink alcohol or use illicit drugs.  She has a current medication list which includes the following  prescription(s): cholecalciferol and omeprazole.  Current Outpatient Prescriptions on File Prior to Visit   Medication Sig Dispense Refill   . Cholecalciferol (VITAMIN D PO) Take by mouth.     Marland Kitchen omeprazole (PRILOSEC) 20 MG capsule TAKE 1 CAPSULE (20 MG TOTAL) BY MOUTH DAILY. 90 capsule 0     No current facility-administered medications on file prior to visit.     She has No Known Allergies..    Review of Systems   Constitutional: Negative for chills.   HENT: Positive for rhinorrhea and sore throat. Negative for postnasal drip.    Respiratory: Positive for cough. Negative for wheezing.    Cardiovascular: Negative for chest pain and palpitations.   Musculoskeletal: Positive for arthralgias.   Allergic/Immunologic: Negative for environmental allergies.   Neurological: Positive for headaches.           Objective:    Physical Exam   Constitutional: She appears well-developed and well-nourished.   HENT:   Head: Normocephalic and atraumatic.   Cardiovascular: Normal rate, regular rhythm and normal heart sounds.  Exam reveals no gallop and no friction rub.    No murmur heard.  Pulmonary/Chest: No respiratory distress. She has no wheezes. She has no rales. She exhibits no tenderness.   breast: no predominant masse,         Assessment/plan :     1. Seasonal allergic rhinitis due to pollen    - loratadine (CLARITIN) 10 MG tablet; Take 1 tablet (10 mg total) by mouth daily.  Dispense: 30 tablet; Refill: 0  - benzonatate (TESSALON PERLES) 100 MG capsule; Take 1 capsule (100 mg total) by mouth 3 (three) times daily as needed for Cough.  Dispense: 20 capsule; Refill: 0  - sodium chloride (OCEAN NASAL SPRAY) 0.65 % nasal spray; 1 spray by Nasal route as needed for Congestion.  Dispense: 30 mL; Refill: 0    2. Numbness and tingling of both legs  Patient refuses surgery   - CBC and differential; Future  - Comprehensive metabolic panel; Future  - Lipid panel; Future  - TSH; Future  - Urinalysis; Future      3. Chronic bilateral low  back pain with sciatica, sciatica laterality unspecified  Patient refuses surgery   - CBC and differential; Future  - Comprehensive metabolic panel; Future  - Lipid panel; Future  - TSH; Future  - Urinalysis; Future    4. Shaking    - Ambulatory referral to Neurology    Procedures

## 2015-09-15 ENCOUNTER — Ambulatory Visit
Admission: RE | Admit: 2015-09-15 | Discharge: 2015-09-15 | Disposition: A | Discharge status: Home / Self Care | Payer: Medicare Other | Source: Ambulatory Visit | Attending: Family Medicine | Admitting: Family Medicine

## 2015-09-15 DIAGNOSIS — R202 Paresthesia of skin: Secondary | ICD-10-CM | POA: Insufficient documentation

## 2015-09-15 DIAGNOSIS — D709 Neutropenia, unspecified: Secondary | ICD-10-CM | POA: Insufficient documentation

## 2015-09-15 DIAGNOSIS — G8929 Other chronic pain: Secondary | ICD-10-CM | POA: Insufficient documentation

## 2015-09-15 DIAGNOSIS — M5440 Lumbago with sciatica, unspecified side: Secondary | ICD-10-CM | POA: Insufficient documentation

## 2015-09-15 LAB — COMPREHENSIVE METABOLIC PANEL
ALT: 15 U/L (ref 0–55)
AST (SGOT): 17 U/L (ref 5–34)
Albumin/Globulin Ratio: 1.4 (ref 0.9–2.2)
Albumin: 3.8 g/dL (ref 3.5–5.0)
Alkaline Phosphatase: 75 U/L (ref 37–106)
BUN: 20 mg/dL — ABNORMAL HIGH (ref 7.0–19.0)
Bilirubin, Total: 0.5 mg/dL (ref 0.1–1.2)
CO2: 31 mEq/L — ABNORMAL HIGH (ref 21–30)
Calcium: 9.4 mg/dL (ref 7.9–10.2)
Chloride: 106 mEq/L (ref 100–111)
Creatinine: 0.7 mg/dL (ref 0.4–1.5)
Globulin: 2.7 g/dL (ref 2.0–3.7)
Glucose: 86 mg/dL (ref 70–100)
Potassium: 4.9 mEq/L (ref 3.5–5.3)
Protein, Total: 6.5 g/dL (ref 6.0–8.3)
Sodium: 143 mEq/L (ref 135–146)

## 2015-09-15 LAB — URINALYSIS
Bilirubin, UA: NEGATIVE
Blood, UA: NEGATIVE
Glucose, UA: NEGATIVE
Ketones UA: NEGATIVE
Leukocyte Esterase, UA: NEGATIVE
Nitrite, UA: NEGATIVE
Protein, UR: NEGATIVE
Specific Gravity UA: 1.017 (ref 1.001–1.035)
Urine pH: 7.5 (ref 5.0–8.0)
Urobilinogen, UA: 0.2 (ref 0.2–2.0)

## 2015-09-15 LAB — LIPID PANEL
Cholesterol / HDL Ratio: 3.1
Cholesterol: 189 mg/dL (ref 0–199)
HDL: 61 mg/dL (ref 40–9999)
LDL Calculated: 116 mg/dL — ABNORMAL HIGH (ref 0–99)
Triglycerides: 58 mg/dL (ref 34–149)
VLDL Calculated: 12 mg/dL (ref 10–40)

## 2015-09-15 LAB — CBC AND DIFFERENTIAL
Basophils Absolute Automated: 0.01 10*3/uL (ref 0.00–0.20)
Basophils Automated: 0 %
Eosinophils Absolute Automated: 0.06 10*3/uL (ref 0.00–0.70)
Eosinophils Automated: 2 %
Hematocrit: 41.7 % (ref 37.0–47.0)
Hgb: 13.3 g/dL (ref 12.0–16.0)
Immature Granulocytes Absolute: 0 10*3/uL
Immature Granulocytes: 0 %
Lymphocytes Absolute Automated: 1.35 10*3/uL (ref 0.50–4.40)
Lymphocytes Automated: 39 %
MCH: 30.9 pg (ref 28.0–32.0)
MCHC: 31.9 g/dL — ABNORMAL LOW (ref 32.0–36.0)
MCV: 96.8 fL (ref 80.0–100.0)
MPV: 12.7 fL — ABNORMAL HIGH (ref 9.4–12.3)
Monocytes Absolute Automated: 0.24 10*3/uL (ref 0.00–1.20)
Monocytes: 7 %
Neutrophils Absolute: 1.83 10*3/uL (ref 1.80–8.10)
Neutrophils: 52 %
Nucleated RBC: 0 /100 WBC (ref 0–1)
Platelets: 151 10*3/uL (ref 140–400)
RBC: 4.31 10*6/uL (ref 4.20–5.40)
RDW: 13 % (ref 12–15)
WBC: 3.49 10*3/uL — ABNORMAL LOW (ref 3.50–10.80)

## 2015-09-15 LAB — TSH: TSH: 3.29 u[IU]/mL (ref 0.35–4.94)

## 2015-09-15 LAB — HEMOLYSIS INDEX: Hemolysis Index: 10 (ref 0–18)

## 2015-09-15 LAB — GFR: EGFR: >60

## 2015-10-25 ENCOUNTER — Other Ambulatory Visit (INDEPENDENT_AMBULATORY_CARE_PROVIDER_SITE_OTHER): Payer: Self-pay | Admitting: Family Medicine

## 2015-10-25 MED ORDER — OMEPRAZOLE 20 MG PO CPDR
DELAYED_RELEASE_CAPSULE | ORAL | Status: DC
Start: 2015-10-25 — End: 2016-01-22

## 2015-10-25 NOTE — Telephone Encounter (Signed)
Pt called in and stated that she is out of medication. Please refill

## 2015-11-12 ENCOUNTER — Ambulatory Visit
Admission: RE | Admit: 2015-11-12 | Discharge: 2015-11-12 | Disposition: A | Discharge status: Home / Self Care | Payer: Medicare Other | Source: Ambulatory Visit | Attending: Internal Medicine | Admitting: Internal Medicine

## 2015-11-12 ENCOUNTER — Ambulatory Visit (INDEPENDENT_AMBULATORY_CARE_PROVIDER_SITE_OTHER): Payer: Medicare Other | Admitting: Internal Medicine

## 2015-11-12 ENCOUNTER — Encounter (INDEPENDENT_AMBULATORY_CARE_PROVIDER_SITE_OTHER): Payer: Self-pay | Admitting: Internal Medicine

## 2015-11-12 VITALS — BP 110/61 | HR 86 | Temp 98.3°F | Resp 16 | Ht 59.0 in | Wt 116.6 lb

## 2015-11-12 DIAGNOSIS — Z720 Tobacco use: Secondary | ICD-10-CM

## 2015-11-12 DIAGNOSIS — R0789 Other chest pain: Secondary | ICD-10-CM

## 2015-11-12 MED ORDER — ALBUTEROL SULFATE HFA 108 (90 BASE) MCG/ACT IN AERS
2.0000 | INHALATION_SPRAY | RESPIRATORY_TRACT | Status: AC | PRN
Start: 2015-11-12 — End: 2016-11-11

## 2015-11-12 NOTE — Progress Notes (Signed)
Subjective:       Patient ID: Michele [name] is a 75 y.o. female.    Chief Complaint   Patient presents with   . Chest Pain     tightness, not pain     HPI    C/o chest tightness on both sides since few months now. Mild, intermittent. Lasts for 10-15 mins. Resolves spontaneously.  Occurs random. No specific aggravating or relieving factors. Denies any chest pain/ SOB/ wheezing/ dizziness/ palpitations, syncope. Reports fatigue.   She does house cleaning 3 times/ week which does not aggravate her symptoms.   She is chronic smoker- 50 yrs. 7-11 cig/ day.     Her recent CBC- no anemia. Lipids- stable.     The following portions of the patient's history were reviewed and updated as appropriate: She  has a past medical history of Gastroesophageal reflux disease; Barrett's esophagus; Abnormal EKG; Arthralgia; Cervical spinal stenosis; Osteopenia; Depression; Colonic polyp; Osteoarthritis; Lower back pain; Disc disorder; and Numbness and tingling of both legs (02/06/2015).  She  does not have any pertinent problems on file.  She  has past surgical history that includes Appendectomy (1960?) and Tubal ligation (1973).  Her family history is negative for Breast cancer, Ovarian cancer, and Cancer.  She  reports that she has been smoking Cigarettes.  She has a 25 pack-year smoking history. She uses smokeless tobacco. She reports that she does not drink alcohol or use illicit drugs.  She has a current medication list which includes the following prescription(s): cholecalciferol, omeprazole, loratadine, and sodium chloride.  She has No Known Allergies..    Review of Systems   Constitutional: Positive for fatigue. Negative for fever and chills.   HENT: Negative for congestion and rhinorrhea.    Respiratory: Positive for chest tightness. Negative for cough, shortness of breath and wheezing.    Cardiovascular: Negative for chest pain, palpitations and leg swelling.   Gastrointestinal: Negative for nausea and vomiting.    Musculoskeletal: Negative for arthralgias.   Neurological: Negative for dizziness, tremors, syncope, weakness, light-headedness and headaches.   Psychiatric/Behavioral: Negative for sleep disturbance.           Objective:    Physical Exam   Constitutional: She appears well-developed and well-nourished. No distress.   Neck: Normal range of motion. Neck supple.   Cardiovascular: Normal rate, regular rhythm and normal heart sounds.    Pulmonary/Chest: Effort normal and breath sounds normal. No respiratory distress. She has no wheezes. She has no rales.   Abdominal: Soft. Bowel sounds are normal. She exhibits no distension. There is no tenderness.   Lymphadenopathy:     She has no cervical adenopathy.   Skin: She is not diaphoretic.   Nursing note and vitals reviewed.  Blood pressure 110/61, pulse 86, temperature 98.3 F (36.8 C), temperature source Oral, resp. rate 16, height 1.499 m (4\' 11" ), weight 52.889 kg (116 lb 9.6 oz).        Assessment:       1. Chest tightness  Spirometry without bronchodilator    ECG 12 lead    X-ray chest PA and lateral    albuterol (VENTOLIN HFA) 108 (90 Base) MCG/ACT inhaler   2. Tobacco abuse  Spirometry without bronchodilator    X-ray chest PA and lateral    albuterol (VENTOLIN HFA) 108 (90 Base) MCG/ACT inhaler            Plan:       Chest tightness;   COPD vs cardiac vs musculoskeletal vs anxiety  -  spirometry and EKG today  EKG_ NSR and non-specific changes. No previous EKG to compare.   Spirometry - suggestive of moderate to severe Obstructive disease. FEv1- 52 %  - CXR with her h/o chronic smoking  - advised trial of albuterol inhaler prn for chest tightness  - consider starting on controller inhaler on her follow up visit    Tobacco abuse;   Counseled on smoking cessation. Pt. Not ready to quit now.   - f/u in 2-4 weeks to reevaluate.     Procedures

## 2015-11-12 NOTE — Progress Notes (Signed)
Has the patient sought any care outside of the [facility]?NO

## 2015-11-13 ENCOUNTER — Encounter (INDEPENDENT_AMBULATORY_CARE_PROVIDER_SITE_OTHER): Payer: Self-pay | Admitting: Internal Medicine

## 2015-11-26 ENCOUNTER — Ambulatory Visit (INDEPENDENT_AMBULATORY_CARE_PROVIDER_SITE_OTHER): Payer: Medicare Other | Admitting: Internal Medicine

## 2015-11-26 ENCOUNTER — Encounter (INDEPENDENT_AMBULATORY_CARE_PROVIDER_SITE_OTHER): Payer: Self-pay | Admitting: Internal Medicine

## 2015-11-26 VITALS — BP 105/61 | HR 80 | Temp 98.1°F | Resp 14 | Wt 118.0 lb

## 2015-11-26 DIAGNOSIS — J449 Chronic obstructive pulmonary disease, unspecified: Secondary | ICD-10-CM | POA: Insufficient documentation

## 2015-11-26 DIAGNOSIS — R29898 Other symptoms and signs involving the musculoskeletal system: Secondary | ICD-10-CM

## 2015-11-26 DIAGNOSIS — M6281 Muscle weakness (generalized): Secondary | ICD-10-CM

## 2015-11-26 DIAGNOSIS — Z23 Encounter for immunization: Secondary | ICD-10-CM

## 2015-11-26 DIAGNOSIS — N898 Other specified noninflammatory disorders of vagina: Secondary | ICD-10-CM

## 2015-11-26 DIAGNOSIS — Z720 Tobacco use: Secondary | ICD-10-CM

## 2015-11-26 MED ORDER — BUDESONIDE-FORMOTEROL FUMARATE 80-4.5 MCG/ACT IN AERO
2.0000 | INHALATION_SPRAY | Freq: Two times a day (BID) | RESPIRATORY_TRACT | Status: DC
Start: 2015-11-26 — End: 2017-03-26

## 2015-11-26 NOTE — Progress Notes (Signed)
Has the patient sought any care outside of the [facility]? No  Patient presented to the office for Pneumococcal 13 administration.  Received injection in the Right arm.  No reaction was noted and patient left in good condition.

## 2015-11-26 NOTE — Progress Notes (Signed)
Subjective:       Patient ID: Michele [name] is a 75 y.o. female.    HPI    Problem   Copd (Chronic Obstructive Pulmonary Disease)    11/12/15;  Spirometry - suggestive of moderate to severe Obstructive disease. FEv1- 52 %.   She gets intermittent chest tightness but not since past 2 weeks. Have not tried albuterol as she didn't have symptoms.   Chronic h/o smoking- 50 yrs. 7-11 cig/ day  Recent CXR- normal.       C/o dark colored vaginal discharge intermittently since one year. Occurs once in couple months. Describes like spotting. Denies any other associated symptoms. No active symptoms today.   No h/o abnormal pap smear in the past.   Not sexually active.     Reports feel weak on her lower extremities left > right.  Reports as chronic for more than a  year. . Denies any joint pain or swelling. No h/o fall or injury. Denies any numbness or tingling on extremities.       The following portions of the patient's history were reviewed and updated as appropriate: allergies, current medications, past family history, past medical history, past social history, past surgical history and problem list.    Review of Systems   Constitutional: Negative for fever, chills and fatigue.   Respiratory: Positive for chest tightness. Negative for cough, shortness of breath and wheezing.    Cardiovascular: Negative for chest pain and palpitations.   Gastrointestinal: Negative for nausea, vomiting and abdominal pain.   Genitourinary: Positive for vaginal discharge. Negative for vaginal bleeding and pelvic pain.           Objective:    Physical Exam   Constitutional: She appears well-developed and well-nourished. No distress.   Cardiovascular: Normal rate, regular rhythm and normal heart sounds.    Pulmonary/Chest: Effort normal and breath sounds normal. No respiratory distress. She has no wheezes.   Skin: She is not diaphoretic.   Nursing note and vitals reviewed.  Blood pressure 105/61, pulse 80, temperature 98.1 F (36.7 C), temperature  source Oral, resp. rate 14, weight 53.524 kg (118 lb).        Assessment:       1. Chronic obstructive pulmonary disease, unspecified COPD type  budesonide-formoterol (SYMBICORT) 80-4.5 MCG/ACT inhaler    Pneumococcal conjugate vaccine 13-valent   2. Tobacco abuse  budesonide-formoterol (SYMBICORT) 80-4.5 MCG/ACT inhaler    Pneumococcal conjugate vaccine 13-valent   3. Vaginal discharge     4. Need for pneumococcal vaccination  Pneumococcal conjugate vaccine 13-valent   5. Weakness of left lower extremity  Ambulatory referral to Physical Therapy            Plan:       1. Chronic obstructive pulmonary disease, unspecified COPD type  - counseled on the new diagnosis and treatment options.   - budesonide-formoterol (SYMBICORT) 80-4.5 MCG/ACT inhaler; Inhale 2 puffs into the lungs 2 (two) times daily.   - Pneumococcal conjugate vaccine 13-valent    2. Tobacco abuse  - counseled on smoking cessation. Discussed on options. She wants to try nicotine patches if she decides to quit.   - budesonide-formoterol (SYMBICORT) 80-4.5 MCG/ACT inhaler; Inhale 2 puffs into the lungs 2 (two) times daily.    - Pneumococcal conjugate vaccine 13-valent    3. Vaginal discharge  -no active symptoms. Advised to return if sx recurs.     4. Need for pneumococcal vaccination  Counseled on risks and benefits of each individual vaccine  and patient/family appears to understand.  - Pneumococcal conjugate vaccine 13-valent today     5. Weakness of left lower extremity  - Ambulatory referral to Physical Therapy    F/u in 2- 3 months or earlier prn     Procedures

## 2015-11-26 NOTE — Patient Instructions (Signed)
Chronic Lung Disease: Preventing Lung Infections  Chronic lung diseases include chronic obstructive pulmonary disease (COPD), which includes chronic bronchitis and emphysema. Other chronic lung diseases include pulmonary fibrosis, sarcoidosis, and other conditions. When you have chronic lung diseases, it's very important to protect yourself from respiratory infections, like colds, the flu, and lung infections. Infections may cause your lung condition to worsen. Although you can't completely avoid them, there are things you can do to lessen the chance of infections.    Take precautions  Taking the following precautions can help you avoid illness:   Remember to keep your hands away from your nose and mouth. Germs on your hands get into your respiratory system this way.   Wash your hands often. When you wash them:   Use soap and warm water.   Rub your hands together well for at least 20 seconds.   Make sure to rinse them well.   Dry your hands on clean towels or air-dry them.   Use hand sanitizer containing alcohol, if you are unable to wash your hands. Use the sanitizer after touching doorknobs, handles, and supermarket carts, for example, since lots of people touch them. Then wash your hands as soon as you can.   To help prevent the flu, get a flu vaccination every year. This may be given at your healthcare provider's office, a drugstore, or pharmacy, or at work. Get your flu shot as soon as the vaccines are available in your area. This is usually around September each year.   To help prevent pneumococcal pneumonia, get pneumonia vaccinations. Talk with your healthcare provider about which pneumococcal vaccinations you need.   Try to stay away from people with respiratory infections, such as colds or the flu. Stay away from crowded places, like shopping centers or movie theatres during cold and flu season.   If you smoke, think about quitting. In addition to causing or worsening many lung conditions, the  lung damage from smoking increases your risk of infections. Stay away from others who smoke, too. This is also harmful and increases your chance of infections.  Date Last Reviewed: 10/19/2014   2000-2016 [employer] [address], [place] [address]. All rights reserved. This information is not intended as a substitute for professional medical care. Always follow your healthcare professional's instructions.

## 2016-01-22 ENCOUNTER — Other Ambulatory Visit (INDEPENDENT_AMBULATORY_CARE_PROVIDER_SITE_OTHER): Payer: Self-pay | Admitting: Family Medicine

## 2016-01-22 MED ORDER — OMEPRAZOLE 20 MG PO CPDR
DELAYED_RELEASE_CAPSULE | ORAL | Status: DC
Start: 2016-01-22 — End: 2016-05-12

## 2016-02-29 ENCOUNTER — Emergency Department: Payer: Medicare Other

## 2016-02-29 ENCOUNTER — Emergency Department
Admission: EM | Admit: 2016-02-29 | Discharge: 2016-02-29 | Disposition: A | Discharge status: Home / Self Care | Payer: Medicare Other | Attending: Emergency Medicine | Admitting: Emergency Medicine

## 2016-02-29 DIAGNOSIS — K219 Gastro-esophageal reflux disease without esophagitis: Secondary | ICD-10-CM | POA: Insufficient documentation

## 2016-02-29 DIAGNOSIS — F17210 Nicotine dependence, cigarettes, uncomplicated: Secondary | ICD-10-CM | POA: Insufficient documentation

## 2016-02-29 DIAGNOSIS — K2270 Barrett's esophagus without dysplasia: Secondary | ICD-10-CM | POA: Insufficient documentation

## 2016-02-29 DIAGNOSIS — R079 Chest pain, unspecified: Secondary | ICD-10-CM | POA: Insufficient documentation

## 2016-02-29 LAB — COMPREHENSIVE METABOLIC PANEL
ALT: 15 U/L (ref 0–55)
AST (SGOT): 23 U/L (ref 5–34)
Albumin/Globulin Ratio: 1.6 (ref 0.9–2.2)
Albumin: 4.1 g/dL (ref 3.5–5.0)
Alkaline Phosphatase: 63 U/L (ref 37–106)
Anion Gap: 9 (ref 5.0–15.0)
BUN: 16 mg/dL (ref 7.0–19.0)
Bilirubin, Total: 0.5 mg/dL (ref 0.2–1.2)
CO2: 28 mEq/L (ref 22–29)
Calcium: 9.1 mg/dL (ref 7.9–10.2)
Chloride: 106 mEq/L (ref 100–111)
Creatinine: 0.7 mg/dL (ref 0.6–1.0)
Globulin: 2.6 g/dL (ref 2.0–3.6)
Glucose: 124 mg/dL — ABNORMAL HIGH (ref 70–100)
Potassium: 4.1 mEq/L (ref 3.5–5.1)
Protein, Total: 6.7 g/dL (ref 6.0–8.3)
Sodium: 143 mEq/L (ref 136–145)

## 2016-02-29 LAB — CBC AND DIFFERENTIAL
Absolute NRBC: 0 10*3/uL
Basophils Absolute Automated: 0.02 10*3/uL (ref 0.00–0.20)
Basophils Automated: 0.5 %
Eosinophils Absolute Automated: 0.05 10*3/uL (ref 0.00–0.70)
Eosinophils Automated: 1.3 %
Hematocrit: 38.2 % (ref 37.0–47.0)
Hgb: 12.7 g/dL (ref 12.0–16.0)
Immature Granulocytes Absolute: 0.01 10*3/uL
Immature Granulocytes: 0.3 %
Lymphocytes Absolute Automated: 1.08 10*3/uL (ref 0.50–4.40)
Lymphocytes Automated: 28.7 %
MCH: 30.2 pg (ref 28.0–32.0)
MCHC: 33.2 g/dL (ref 32.0–36.0)
MCV: 91 fL (ref 80.0–100.0)
MPV: 11.2 fL (ref 9.4–12.3)
Monocytes Absolute Automated: 0.25 10*3/uL (ref 0.00–1.20)
Monocytes: 6.6 %
Neutrophils Absolute: 2.35 10*3/uL (ref 1.80–8.10)
Neutrophils: 62.6 %
Nucleated RBC: 0 /100 WBC (ref 0.0–1.0)
Platelets: 153 10*3/uL (ref 140–400)
RBC: 4.2 10*6/uL (ref 4.20–5.40)
RDW: 12 % (ref 12–15)
WBC: 3.76 10*3/uL (ref 3.50–10.80)

## 2016-02-29 LAB — TROPONIN I
Troponin I: 0.01 ng/mL (ref 0.00–0.09)
Troponin I: 0.01 ng/mL (ref 0.00–0.09)

## 2016-02-29 LAB — IHS D-DIMER: D-Dimer: 0.73 ug/mL FEU — ABNORMAL HIGH (ref 0.00–0.51)

## 2016-02-29 LAB — GFR: EGFR: >60

## 2016-02-29 NOTE — ED Notes (Signed)
States has been having short, sharp chest pains "for months" and at times "shaky" also for months. When asked why she came today, she said she felt hot in both arms and across her chest and across shoulders. Hotness bilateral. Hot feeling stayed a few minutes and then resolved.Just had this feeling one time.

## 2016-02-29 NOTE — ED Provider Notes (Signed)
EMERGENCY DEPARTMENT HISTORY AND PHYSICAL EXAM     Physician/Midlevel provider first contact with patient: 02/29/16 1801         Date: 02/29/2016  Patient Name: Michele [name]      Provider Assessment       Provider Assessment: 75 y.o. female with chest pain. The differential diagnosis includes ACS, PE, aortic dissection, pneumothorax, esophageal rupture, pneumonia, GI causes of pain, and musculoskeletal chest pain. Regarding ACS, I recommended observation in the [name] as her HEART score is 4. She did not want to do this and asked for alternatives, which I provided by checking a repeat troponin/EKG.  ED testing reassuring, but recommended cardiology evaluation ASAP, or return immediately if she chooses. Regarding PE, low clinical gestalt, PERC +, age adjusted D-dimer negative.  Aortic dissection appears to be less likely at this time.        History of Presenting Illness     History Provided By: Patient    Preferred Language: English    Chief Complaint: CP  Onset: Hour and half ago  Timing: Persistent  Location: Left sided  Quality: Sharp  Modifying Factors: None reported  Associated Symptoms: Chest tightness, leg pain/swelling, and hotness  Pertinent Negatives: Denies SOB, nausea, vomiting, dysuria, DOE    Additional History: Michele [name] is a 75 y.o. female presenting to the ED with persistent left sided CP. The patient was in her usual state of health until about an hour and a half ago with associated hotness, chest tightness, and leg pain/swelling. Pt states experiencing CP, chest tightness, and leg pain/swelling at baseline for a couple of months now, but the associated "hotness" is new. The pt was washing dishes when the onset occurred and the hotness lasted for a few minutes. Denies any SOB during the episode as well as any DOE, noting she exercises frequently. Pt feels fine right now, but is still experiencing chest tightness. She's had bilateral leg pain/swelling for a couple of years now. Her last stress  test was years ago, apparently normal. Pt does not take ASA and denies family hx of early heart disease. Denies taking hormones currently as well as nausea, vomiting, or dysuria.     PCP: Jeraldine Loots, MD   Specialist: Alver Sorrow, Cardiologist      No current facility-administered medications for this encounter.     Current Outpatient Prescriptions   Medication Sig Dispense Refill   . Calcium Carbonate (CALCIUM 600 PO) Take by mouth.     . Cholecalciferol (VITAMIN D PO) Take by mouth.     Marland Kitchen omeprazole (PRILOSEC) 20 MG capsule TAKE 1 CAPSULE (20 MG TOTAL) BY MOUTH DAILY. 90 capsule 0   . albuterol (VENTOLIN HFA) 108 (90 Base) MCG/ACT inhaler Inhale 2 puffs into the lungs every 4 (four) hours as needed for Wheezing or Shortness of Breath (chest tightness). 1 Inhaler 5   . budesonide-formoterol (SYMBICORT) 80-4.5 MCG/ACT inhaler Inhale 2 puffs into the lungs 2 (two) times daily. 10.2 g 1   . loratadine (CLARITIN) 10 MG tablet Take 1 tablet (10 mg total) by mouth daily. 30 tablet 0   . sodium chloride (OCEAN NASAL SPRAY) 0.65 % nasal spray 1 spray by Nasal route as needed for Congestion. 30 mL 0       Past History     Past Medical History:  Past Medical History   Diagnosis Date   . Gastroesophageal reflux disease    . Barrett's esophagus    . Abnormal EKG    . Arthralgia    .  Cervical spinal stenosis    . Osteopenia    . Depression    . Colonic polyp    . Osteoarthritis    . Lower back pain    . Disc disorder    . Numbness and tingling of both legs 02/06/2015       Past Surgical History:  Past Surgical History   Procedure Laterality Date   . Appendectomy  1960?     when was a teenager   . Tubal ligation  1973       Family History:  Family History   Problem Relation Age of Onset   . Breast cancer Neg Hx    . Ovarian cancer Neg Hx    . Cancer Neg Hx        Social History:  Social History   Substance Use Topics   . Smoking status: Current Every Day Smoker -- 0.50 packs/day for 50 years     Types: Cigarettes   .  Smokeless tobacco: Current User   . Alcohol Use: No       Allergies:  No Known Allergies    Review of Systems     Review of Systems   Constitutional: Negative for fever and activity change.   HENT: Negative for trouble swallowing.    Eyes: Negative for discharge.   Respiratory: Positive for chest tightness. Negative for cough and shortness of breath.    Cardiovascular: Positive for chest pain and leg swelling.   Gastrointestinal: Negative for nausea, vomiting and abdominal pain.   Genitourinary: Negative for dysuria.   Musculoskeletal: Negative for neck pain.   Skin: Negative for rash.   Neurological: Negative for headaches.   Psychiatric/Behavioral: Negative for confusion.     Physical Exam   BP 143/67 mmHg  Pulse 65  Temp(Src) 98 F (36.7 C) (Oral)  Resp 20  Ht 5\' 4"  (1.626 m)  Wt 52.164 kg  BMI 19.73 kg/m2  SpO2 96%    Physical Examination:    General appearance - Well appearing, not in distress  Eyes - Pupils equal, extraocular eye movements grossly intact  ENT - Moist mucous membranes, OP normal  Neck - Supple, grossly normal ROM  Heart - Normal rate, regular rhythm. No murmurs  Chest - Normal work of breathing, lungs clear to auscultation bilaterally  Abdomen - Soft, nontender, non-distended  Neurological - Alert, moves all extremities, cranial nerves grossly normal  Psychiatric - Grossly oriented, normal insight  Musculoskeletal - No deformity or tenderness. Normal radial and pedal pulses.   Extremities - No pedal edema, distal blood flow grossly normal  Skin - Normal coloration, no rashes where visualized     Diagnostic Study Results     Labs -     Results     Procedure Component Value Units Date/Time    Troponin I [[id number]] Collected:  02/29/16 2125    Specimen Information:  Blood Updated:  02/29/16 2159     Troponin I 0.01 ng/mL     Troponin I [[id number]] Collected:  02/29/16 1817    Specimen Information:  Blood Updated:  02/29/16 1900     Troponin I 0.01 ng/mL     Comprehensive metabolic panel  [[id number]]  (Abnormal) Collected:  02/29/16 1817    Specimen Information:  Blood Updated:  02/29/16 1846     Glucose 124 (H) mg/dL      BUN 42.5 mg/dL      Creatinine 0.7 mg/dL      Sodium 956 mEq/L  Potassium 4.1 mEq/L      Chloride 106 mEq/L      CO2 28 mEq/L      Calcium 9.1 mg/dL      Protein, Total 6.7 g/dL      Albumin 4.1 g/dL      AST (SGOT) 23 U/L      ALT 15 U/L      Alkaline Phosphatase 63 U/L      Bilirubin, Total 0.5 mg/dL      Globulin 2.6 g/dL      Albumin/Globulin Ratio 1.6      Anion Gap 9.0     GFR [[id number]] Collected:  02/29/16 1817     EGFR >60.0 Updated:  02/29/16 1846    D-Dimer [[id number]]  (Abnormal) Collected:  02/29/16 1817     D-Dimer 0.73 (H) ug/mL FEU Updated:  02/29/16 1845    CBC with differential [[id number]] Collected:  02/29/16 1817    Specimen Information:  Blood from Blood Updated:  02/29/16 1828     WBC 3.76 x10 3/uL      Hgb 12.7 g/dL      Hematocrit 08.6 %      Platelets 153 x10 3/uL      RBC 4.20 x10 6/uL      MCV 91.0 fL      MCH 30.2 pg      MCHC 33.2 g/dL      RDW 12 %      MPV 11.2 fL      Neutrophils 62.6 %      Lymphocytes Automated 28.7 %      Monocytes 6.6 %      Eosinophils Automated 1.3 %      Basophils Automated 0.5 %      Immature Granulocyte 0.3 %      Nucleated RBC 0.0 /100 WBC      Neutrophils Absolute 2.35 x10 3/uL      Abs Lymph Automated 1.08 x10 3/uL      Abs Mono Automated 0.25 x10 3/uL      Abs Eos Automated 0.05 x10 3/uL      Absolute Baso Automated 0.02 x10 3/uL      Absolute Immature Granulocyte 0.01 x10 3/uL      Absolute NRBC 0.00 x10 3/uL           Radiologic Studies -   Radiology Results (24 Hour)     Procedure Component Value Units Date/Time    XR Chest  AP Portable [[id number]] Collected:  02/29/16 1839    Order Status:  Completed Updated:  02/29/16 1843    Narrative:      XR CHEST AP PORTABLE    CLINICAL INDICATION: chest pain . Generalized pain.    TECHNIQUE: The following radiographs obtained per protocol:XR CHEST  AP  PORTABLE    COMPARISON: 11/12/2015    FINDINGS:    Cardiomediastinal silhouette is within normal limits.  No infiltrate. No  effusion. The lungs are clear.  No acute abnormalities are apparent. No  active disease.No significant interval change.      Impression:          No acute abnormality.    Darnelle Maffucci, MD   02/29/2016 6:39 PM        .    Medical Decision Making   I am the first provider for this patient.    I reviewed the vital signs, available nursing notes, past medical history, past surgical history, family history and social history.  Vital Signs-Reviewed the patient's vital signs.     Patient Vitals for the past 12 hrs:   BP Temp Pulse Resp   02/29/16 2227 143/67 mmHg 98 F (36.7 C) 65 20   02/29/16 2007 123/61 mmHg 98.4 F (36.9 C) 60 16   02/29/16 1751 126/58 mmHg 98.4 F (36.9 C) 76 12       Pulse Oximetry Analysis - Normal 97% on RA    EKG:  Interpreted by the EP.   Time Interpreted: 1757   Rate: 74   Rhythm: Normal Sinus Rhythm    QTc: 412   Interpretation: Non specific T wave abnormality   Comparison: Similar to prior on 11/12/2015.    EKG:  Interpreted by the EP.   Time Interpreted: 2143   Rate: 57   Rhythm: Sinus Bradycardia    QTc: 395   Interpretation: Non specific T wave abnormality   Comparison: Unchanged from prior      Decision Instruments:   Heart Score         Value    History  0    EKG  1    Risk Factors  1    Total (with age)  4    Onset of pain (time of START of last episode of chest pain)?  0-3 hrs ago    Timing of repeat Troponin/EKG order  HEART Score exceeds limit for Chest Pain Pathway      Wells/PERC Score         Value    Wells Score     Previous DVT or PE  0    HR greater than 100  0    Surgery within 4 weeks, or Immobilization in last 3 days  0    Clinical Signs/Symptoms of DVT  0    Alternative diagnosis less likely than PE  0    Hemoptysis  0    Malignancy with treatment within 6 months or palliative care  0    Wells Score for PE  0    Wells Rule Risk  Low Risk: <2  points (Continue to [name] Calculator)    PERC Score     Age (read only)  greater than 50 years    HR >= 100  0    O2 Sat on Room Air < 95%  0    Prior History of Venous Thromboembolism  0    Trauma or Surgery within 4 weeks  0    Hemoptysis  0    Exogenous Estrogen  0    Unilateral Leg Swelling  0    PERC Rule Score (Calculated)  1    PERC Rule Risk  PERC rule is not satisfied and cannot be used to rule out PE.        Old Medical Records: Nursing notes.    ED Course:     7:21 PM - Updated pt on results including EKG. Discussed the risk factors involved and recommended hospitalization. States she would rather go home. Instead recommended to wait another 3 hours and assess then. Pt agrees with waiting for repeat troponin and EKG.    10:07 PM - Pt fells well, no longer in pain, and wants to go home. Reviewed results. Discussed follow up plans and return precautions. Pt expressed understanding and is comfortable with discharge home.      Diagnosis     Clinical Impression:   1. Chest pain, unspecified type        Treatment Plan:  ED Disposition     Discharge [facility] discharge to home/self care.    Condition at disposition: Stable              _______________________________      This note is prepared by  Rowe Clack acting as Scribe for Charlott Holler, MD.    Charlott Holler, MD.  The scribe's documentation has been prepared under my direction and personally reviewed by me in its entirety.  I confirm that the note above accurately reflects all work, treatment, procedures, and medical decision making performed by me.  _______________________________        Theresia Bough, MD  02/29/16 [phone]

## 2016-02-29 NOTE — Discharge Instructions (Signed)
You were seen in the [facility] Emergency Department by Dr. Michel Harrow.       For home care, no strenuous physical activity until cleared by cardiologist and return immediately for any new/returning symptoms.    Please make sure to return if you have fever, vomiting, chest pain, trouble breathing, if you are not getting better, or any other concerns.  We are always open and happy to help.    Please be aware that an emergency department diagnosis is usually preliminary, and that a definitive diagnosis cannot be made without the help of time or further testing.  Every disease has a progression and may take time before it is recognizable by a physician.  It is extremely important that you return to the Emergency Department or see your own doctor if you do not improve, or especially if your symptoms worsen or change.     In short, do not hesitate to return to the emergency department if you sense something is not right. We are never upset to see you again.    Take your discharge instructions to your primary care doctor and all other follow-up care so they have a record of what happened in your visit today. Emergency care is not a substitute for general medical care.      Chest Pain of Unclear Etiology    You have been seen for chest pain. The cause of your pain is not yet known.    Your doctor has learned about your medical history, examined you, and checked any tests that were done. Still, it is unclear why you are having pain. The doctor thinks there is only a very small chance that your pain is caused by a life-threatening condition. Later, your primary care doctor might do more tests or check you again.    Sometimes chest pain is caused by a dangerous condition, like a heart attack, aorta injury, blood clot in the lung, or collapsed lung. It is unlikely that your pain is caused by a life-threatening condition if: Your chest pain lasts only a few seconds at a time; you are not short of breath,  nauseated (sick to your stomach), sweaty, or lightheaded; your pain gets worse when you twist or bend; your pain improves with exercise or hard work.    Chest pain is serious. It is VERY IMPORTANT that you follow up with your regular doctor and seek medical attention immediately here or at the nearest Emergency Department if your symptoms become worse or they change.    YOU SHOULD SEEK MEDICAL ATTENTION IMMEDIATELY, EITHER HERE OR AT THE NEAREST EMERGENCY DEPARTMENT, IF ANY OF THE FOLLOWING OCCURS:   Your pain gets worse.   Your pain makes you short of breath, nauseated, or sweaty.   Your pain gets worse when you walk, go up stairs, or exert yourself.   You feel weak, lightheaded, or faint.   It hurts to breathe.   Your leg swells.   Your symptoms get worse or you have new symptoms or concerns.

## 2016-03-01 LAB — ECG 12-LEAD
Atrial Rate: 74 {beats}/min
Atrial Rate: 57 {beats}/min
P Axis: 81 degrees
P Axis: 48 degrees
P-R Interval: 162 ms
P-R Interval: 186 ms
Q-T Interval: 372 ms
Q-T Interval: 406 ms
QRS Duration: 76 ms
QRS Duration: 90 ms
QTC Calculation (Bezet): 412 ms
QTC Calculation (Bezet): 395 ms
R Axis: 54 degrees
R Axis: 51 degrees
T Axis: 58 degrees
T Axis: 49 degrees
Ventricular Rate: 74 {beats}/min
Ventricular Rate: 57 {beats}/min

## 2016-03-05 ENCOUNTER — Ambulatory Visit (INDEPENDENT_AMBULATORY_CARE_PROVIDER_SITE_OTHER): Payer: Medicare Other | Admitting: Internal Medicine

## 2016-03-05 ENCOUNTER — Encounter (INDEPENDENT_AMBULATORY_CARE_PROVIDER_SITE_OTHER): Payer: Self-pay | Admitting: Internal Medicine

## 2016-03-05 VITALS — BP 113/66 | HR 66 | Temp 98.5°F | Resp 20 | Wt 115.2 lb

## 2016-03-05 DIAGNOSIS — M6281 Muscle weakness (generalized): Secondary | ICD-10-CM

## 2016-03-05 DIAGNOSIS — Z720 Tobacco use: Secondary | ICD-10-CM

## 2016-03-05 DIAGNOSIS — R0789 Other chest pain: Secondary | ICD-10-CM

## 2016-03-05 DIAGNOSIS — K2270 Barrett's esophagus without dysplasia: Secondary | ICD-10-CM

## 2016-03-05 DIAGNOSIS — M5440 Lumbago with sciatica, unspecified side: Secondary | ICD-10-CM

## 2016-03-05 DIAGNOSIS — G8929 Other chronic pain: Secondary | ICD-10-CM

## 2016-03-05 DIAGNOSIS — R29898 Other symptoms and signs involving the musculoskeletal system: Secondary | ICD-10-CM

## 2016-03-05 NOTE — Progress Notes (Signed)
Nursing Documentation:  Limb alert status: Patient asked and denied any limb restrictions for blood pressure/blood draws.  Has the patient seen any other providers since their last visit: no  The patient is due for falls risk screening, spirometry, shingles vaccine and dxa,medicare annual wellness

## 2016-03-06 ENCOUNTER — Encounter (INDEPENDENT_AMBULATORY_CARE_PROVIDER_SITE_OTHER): Payer: Self-pay | Admitting: Internal Medicine

## 2016-03-06 NOTE — Progress Notes (Signed)
Subjective:       Patient ID: Michele [name] is a 75 y.o. female.  Chief Complaint   Patient presents with   . chest tightness     warmth across chest to arms, fullness at neck and behind ears     HPI    C/o chest tightness- feeling of warmth or burning across her chest and felt fullness on her neck. Sx occurred last week. Lasted for few mins and then resolved.   Denies any chest pain / SOB/ dizziness/ palpitations/ syncope/ nausea/ vomiting. She was seen in the ED for the above symptoms on 02/29/16 and work up was normal including but not limited to EKG/ troponin/ d-dimer/ CXR. She was recommended admission for observation but she declined. She was later discharged to follow up with her cardiologist Dr. Melanee Spry.   She does have h/o COPD- on symbicort daily and takes albuterol inhaler prn   She is a chronic smoker. Trying to cut down gradually.   H/o barretts esophagus/ GERD; on omeprazole 20mg  once daily. Reports sx controlled with medication  ER records reviewed through [facility].     C/o weakness on both lower extremities. Not able to do much activity. Gets tired easily. Does have h/o chronic low back pain with radiation to both lower extremities. No numbness or tingling.       The following portions of the patient's history were reviewed and updated as appropriate: allergies, current medications, past family history, past medical history, past social history, past surgical history and problem list.    Review of Systems   Constitutional: Negative for fever, chills and fatigue.   HENT: Negative for postnasal drip, rhinorrhea and sneezing.    Respiratory: Positive for chest tightness. Negative for cough and shortness of breath.    Cardiovascular: Negative for chest pain.   Gastrointestinal: Negative for nausea, vomiting and abdominal pain.   Neurological: Negative for dizziness, light-headedness and headaches.           Objective:    Physical Exam   Constitutional: She appears well-developed and well-nourished.    Cardiovascular: Normal rate, regular rhythm and normal heart sounds.    Pulmonary/Chest: Effort normal and breath sounds normal. No respiratory distress. She has no wheezes. She has no rales. She exhibits no tenderness.   Abdominal: Soft. Bowel sounds are normal. She exhibits no distension. There is no tenderness.   Neurological:   Both LE; normal strength and tone. Normal reflexes. Normal sensation and circulation.    Nursing note and vitals reviewed.          Assessment:       1. Chest tightness     2. Tobacco abuse     3. Weakness of both lower extremities  Ambulatory referral to Physical Therapy   4. Chronic bilateral low back pain with sciatica, sciatica laterality unspecified  Ambulatory referral to Physical Therapy   5. Barrett's esophagus without dysplasia              Plan:       1. Chest tightness  Cardiac vs pulmonary vs GERD vs MS vs anxiety  No active symptoms now  Advised follow up with her cardiologist Dr. Melanee Spry  Continue omeprazole vs zantac and advised on diet  Counseled on smoking cessation    2. Tobacco abuse  Tobacco Dependence:  History   Smoking status   . Current Every Day Smoker -- 0.50 packs/day for 50 years   . Types: Cigarettes   Smokeless tobacco   .  Current User     Ready to quit: Not Answered  Counseling given: Not Answered      Smoking Cessation Counseling:  Date: March 06, 2016  Advice/Assessment: Patient strongly urged to quit tobacco use.  Patient is not willing to make a quit attempt.   RISKS -  patient counseled on the following risks: short term risks (including dyspnea, exacerbation of asthma, impotence, infertility) and long term risks (including MI, CVA, COPD, lung cancer, oropharynx cancer, esophageal cancer, and long term disability)  Total counseling time: 3 to 10 minutes       3. Weakness of both lower extremities  - Ambulatory referral to Physical Therapy    4. Chronic bilateral low back pain with sciatica, sciatica laterality unspecified  - advised tylenol prn   -  Ambulatory referral to Physical Therapy    5. Barrett's esophagus without dysplasia  - stable  - counseled on benefits/ risks of long term PPI.   Advised continue omeprazole vs zantac  Advised on diet  - f/u with GI as scheduled.     F/u in 2 months or earlier prn     Procedures

## 2016-03-07 ENCOUNTER — Ambulatory Visit (INDEPENDENT_AMBULATORY_CARE_PROVIDER_SITE_OTHER): Payer: Medicare Other | Admitting: Internal Medicine

## 2016-03-19 ENCOUNTER — Encounter (INDEPENDENT_AMBULATORY_CARE_PROVIDER_SITE_OTHER): Payer: Self-pay

## 2016-03-24 ENCOUNTER — Encounter (INDEPENDENT_AMBULATORY_CARE_PROVIDER_SITE_OTHER): Payer: Self-pay

## 2016-04-02 ENCOUNTER — Other Ambulatory Visit: Payer: Self-pay | Admitting: Cardiovascular Disease

## 2016-04-02 DIAGNOSIS — R9431 Abnormal electrocardiogram [ECG] [EKG]: Secondary | ICD-10-CM

## 2016-04-02 DIAGNOSIS — R079 Chest pain, unspecified: Secondary | ICD-10-CM

## 2016-04-03 NOTE — Patient Instructions (Signed)
Exam-specific instructions: Procedure to be done in CT.    Medication and NPO instructions: May eat light breakfast early am the nothing by mouth 2 hours prior to procedure.  No caffeine.  No exercise prior to procedure.    Clothing instructions: Wear loose comfortable clothing.    Arrival instructions: Check in at [facility] with insurance and ID cards.    Other reminders: None

## 2016-04-07 ENCOUNTER — Ambulatory Visit
Admission: RE | Admit: 2016-04-07 | Discharge: 2016-04-07 | Disposition: A | Discharge status: Home / Self Care | Payer: Medicare Other | Source: Ambulatory Visit | Attending: Cardiovascular Disease | Admitting: Cardiovascular Disease

## 2016-04-07 DIAGNOSIS — R079 Chest pain, unspecified: Secondary | ICD-10-CM

## 2016-04-07 DIAGNOSIS — I2510 Atherosclerotic heart disease of native coronary artery without angina pectoris: Secondary | ICD-10-CM | POA: Insufficient documentation

## 2016-04-07 DIAGNOSIS — R9431 Abnormal electrocardiogram [ECG] [EKG]: Secondary | ICD-10-CM | POA: Insufficient documentation

## 2016-04-07 MED ORDER — IODIXANOL 320 MG/ML IV SOLN
100.0000 mL | Freq: Once | INTRAVENOUS | Status: AC | PRN
Start: 2016-04-07 — End: 2016-04-07
  Administered 2016-04-07: 100 mL via INTRAVENOUS

## 2016-04-15 LAB — WHOLE BLOOD CREATININE WITH GFR POCT
GFR POCT: >60 mL/min/{1.73_m2} (ref >60)
Whole Blood Creatinine POCT: 0.6 mg/dL (ref 0.5–1.1)

## 2016-04-23 ENCOUNTER — Ambulatory Visit (INDEPENDENT_AMBULATORY_CARE_PROVIDER_SITE_OTHER): Payer: Medicare Other | Admitting: Internal Medicine

## 2016-05-12 ENCOUNTER — Other Ambulatory Visit (INDEPENDENT_AMBULATORY_CARE_PROVIDER_SITE_OTHER): Payer: Self-pay | Admitting: Family Medicine

## 2016-06-02 ENCOUNTER — Encounter (INDEPENDENT_AMBULATORY_CARE_PROVIDER_SITE_OTHER): Payer: Self-pay | Admitting: Internal Medicine

## 2016-06-02 ENCOUNTER — Ambulatory Visit (INDEPENDENT_AMBULATORY_CARE_PROVIDER_SITE_OTHER): Payer: Medicare Other | Admitting: Internal Medicine

## 2016-06-02 ENCOUNTER — Ambulatory Visit
Admission: RE | Admit: 2016-06-02 | Discharge: 2016-06-02 | Disposition: A | Discharge status: Home / Self Care | Payer: Medicare Other | Source: Ambulatory Visit | Attending: Internal Medicine | Admitting: Internal Medicine

## 2016-06-02 VITALS — BP 106/61 | HR 72 | Temp 98.2°F | Resp 12 | Wt 114.2 lb

## 2016-06-02 DIAGNOSIS — R531 Weakness: Secondary | ICD-10-CM

## 2016-06-02 DIAGNOSIS — R5383 Other fatigue: Secondary | ICD-10-CM | POA: Insufficient documentation

## 2016-06-02 DIAGNOSIS — M8580 Other specified disorders of bone density and structure, unspecified site: Secondary | ICD-10-CM

## 2016-06-02 DIAGNOSIS — Z8639 Personal history of other endocrine, nutritional and metabolic disease: Secondary | ICD-10-CM | POA: Insufficient documentation

## 2016-06-02 DIAGNOSIS — N898 Other specified noninflammatory disorders of vagina: Secondary | ICD-10-CM

## 2016-06-02 DIAGNOSIS — R29898 Other symptoms and signs involving the musculoskeletal system: Secondary | ICD-10-CM

## 2016-06-02 DIAGNOSIS — Z1231 Encounter for screening mammogram for malignant neoplasm of breast: Secondary | ICD-10-CM

## 2016-06-02 DIAGNOSIS — F419 Anxiety disorder, unspecified: Secondary | ICD-10-CM

## 2016-06-02 LAB — CBC AND DIFFERENTIAL
Absolute NRBC: 0 10*3/uL
Basophils Absolute Automated: 0.02 10*3/uL (ref 0.00–0.20)
Basophils Automated: 0.4 %
Eosinophils Absolute Automated: 0.03 10*3/uL (ref 0.00–0.70)
Eosinophils Automated: 0.6 %
Hematocrit: 41.8 % (ref 37.0–47.0)
Hgb: 12.9 g/dL (ref 12.0–16.0)
Immature Granulocytes Absolute: 0.02 10*3/uL
Immature Granulocytes: 0.4 %
Lymphocytes Absolute Automated: 1.03 10*3/uL (ref 0.50–4.40)
Lymphocytes Automated: 20.9 %
MCH: 29.9 pg (ref 28.0–32.0)
MCHC: 30.9 g/dL — ABNORMAL LOW (ref 32.0–36.0)
MCV: 97 fL (ref 80.0–100.0)
MPV: 12.3 fL (ref 9.4–12.3)
Monocytes Absolute Automated: 0.32 10*3/uL (ref 0.00–1.20)
Monocytes: 6.5 %
Neutrophils Absolute: 3.52 10*3/uL (ref 1.80–8.10)
Neutrophils: 71.2 %
Nucleated RBC: 0 /100 WBC (ref 0.0–1.0)
Platelets: 143 10*3/uL (ref 140–400)
RBC: 4.31 10*6/uL (ref 4.20–5.40)
RDW: 12 % (ref 12–15)
WBC: 4.94 10*3/uL (ref 3.50–10.80)

## 2016-06-02 LAB — COMPREHENSIVE METABOLIC PANEL
ALT: 13 U/L (ref 0–55)
AST (SGOT): 21 U/L (ref 5–34)
Albumin/Globulin Ratio: 1.4 (ref 0.9–2.2)
Albumin: 3.9 g/dL (ref 3.5–5.0)
Alkaline Phosphatase: 67 U/L (ref 37–106)
BUN: 15 mg/dL (ref 7.0–19.0)
Bilirubin, Total: 0.4 mg/dL (ref 0.1–1.2)
CO2: 29 mEq/L (ref 21–29)
Calcium: 9.2 mg/dL (ref 7.9–10.2)
Chloride: 103 mEq/L (ref 100–111)
Creatinine: 0.7 mg/dL (ref 0.4–1.5)
Globulin: 2.8 g/dL (ref 2.0–3.7)
Glucose: 88 mg/dL (ref 70–100)
Potassium: 4 mEq/L (ref 3.5–5.1)
Protein, Total: 6.7 g/dL (ref 6.0–8.3)
Sodium: 140 mEq/L (ref 135–146)

## 2016-06-02 LAB — HEMOLYSIS INDEX: Hemolysis Index: 4 (ref 0–18)

## 2016-06-02 LAB — VITAMIN B12: Vitamin B-12: 400 pg/mL (ref 211–911)

## 2016-06-02 LAB — VITAMIN D,25 OH,TOTAL: Vitamin D, 25 OH, Total: 50 ng/mL (ref 30–100)

## 2016-06-02 LAB — GFR: EGFR: >60

## 2016-06-02 MED ORDER — ESCITALOPRAM OXALATE 5 MG PO TABS
5.0000 mg | ORAL_TABLET | Freq: Every day | ORAL | 1 refills | Status: DC
Start: 2016-06-02 — End: 2016-09-01

## 2016-06-02 NOTE — Progress Notes (Signed)
Nursing Documentation:  Limb alert status: Patient asked and denied any limb restrictions for blood pressure/blood draws.  Has the patient seen any other providers since their last visit: no  The patient is due for eye exam, spirometry, shingles vaccine and medicare annual wellness,dxa

## 2016-06-03 ENCOUNTER — Encounter (INDEPENDENT_AMBULATORY_CARE_PROVIDER_SITE_OTHER): Payer: Self-pay | Admitting: Internal Medicine

## 2016-06-03 NOTE — Progress Notes (Signed)
Subjective:       Patient ID: Michele [name] is a 75 y.o. female.  Chief Complaint   Patient presents with   . Generalized Body Aches       HPI    C/o generalized  Body aches and feeling shaky and weak. Symptoms are chronic. Was referred to neurologist earlier. Saw dr. Patricia Nettle. Ruled out parkinsons or other movement disorder. Was told probable anxiety related.     C/o vaginal discharge - brownish discharge few months ago. Denies any further episodes. havent followed with gyn . Had referral in the past but havent scheduled.  Denies any abdominal pain.     H/o osteopenia; last dexa in 2015- on ca and vitamin D supplements. Denies any fracture.     H/o vitamin b12 def; not on supplements currently.     The following portions of the patient's history were reviewed and updated as appropriate: allergies, current medications, past family history, past medical history, past social history, past surgical history and problem list.    Review of Systems   Constitutional: Negative for chills and fever.   Respiratory: Negative for shortness of breath.    Cardiovascular: Negative for chest pain.   Gastrointestinal: Negative for abdominal pain, nausea and vomiting.   Genitourinary: Positive for vaginal discharge.   Psychiatric/Behavioral: Negative for confusion, decreased concentration and sleep disturbance. The patient is nervous/anxious.            Objective:    Physical Exam   Constitutional: She is oriented to person, place, and time. She appears well-developed and well-nourished.   Cardiovascular: Normal rate and regular rhythm.    Pulmonary/Chest: Effort normal and breath sounds normal. No respiratory distress.   Neurological: She is alert and oriented to person, place, and time. She displays normal reflexes. Coordination normal.   Psychiatric: She has a normal mood and affect.   anxious   Nursing note and vitals reviewed.  Blood pressure 106/61, pulse 72, temperature 98.2 F (36.8 C), temperature source Oral, resp. rate 12,  weight 51.8 kg (114 lb 3.2 oz).        Assessment:       1. Generalized weakness  CANCELED: Vitamin D,25 OH, Total    CANCELED: Vitamin B12   2. Weakness of both lower extremities  Ambulatory referral to Physical Therapy   3. Vaginal discharge  Ambulatory referral to Gynecology   4. Anxiety  escitalopram (LEXAPRO) 5 MG tablet   5. Osteopenia, unspecified location  Dxa Bone Density Axial Skeleton    CANCELED: Vitamin D,25 OH, Total   6. Fatigue, unspecified type  CANCELED: Vitamin D,25 OH, Total    CANCELED: Vitamin B12    CANCELED: CBC w/o differential    CANCELED: Comprehensive Metabolic Panel   7. Encounter for screening mammogram for malignant neoplasm of breast  Mammo Digital Screening Bilateral W Cad   8. History of non anemic vitamin B12 deficiency  CANCELED: Vitamin B12           Plan:       1. Generalized weakness  Check labs as above.     2. Weakness of both lower extremities  - Ambulatory referral to Physical Therapy    3. Vaginal discharge  - Ambulatory referral to Gynecology    4. Anxiety  Counseled and advised to start on trial of lexapro 5mg  once daily.   - escitalopram (LEXAPRO) 5 MG tablet; Take 1 tablet (5 mg total) by mouth daily.  Dispense: 30 tablet; Refill: 1  5. Osteopenia, unspecified location  Continue ca and vitamin D supplements  - Dxa Bone Density Axial Skeleton; Future    6. Fatigue, unspecified type  Check labs as above    7. Encounter for screening mammogram for malignant neoplasm of breast  - Mammo Digital Screening Bilateral W Cad; Future    8. History of non anemic vitamin B12 deficiency  Check b12 levels    F/u in 2 months or earlier prn       Procedures

## 2016-06-12 ENCOUNTER — Other Ambulatory Visit (INDEPENDENT_AMBULATORY_CARE_PROVIDER_SITE_OTHER): Payer: Self-pay

## 2016-06-12 MED ORDER — OMEPRAZOLE 20 MG PO CPDR
DELAYED_RELEASE_CAPSULE | ORAL | 0 refills | Status: DC
Start: 2016-06-12 — End: 2016-09-01

## 2016-06-12 NOTE — Telephone Encounter (Signed)
Patient came in on 06/02/16 for follow up. Patient requested 90 day supply as she will be leaving town for several months.

## 2016-06-16 ENCOUNTER — Ambulatory Visit
Admission: RE | Admit: 2016-06-16 | Discharge: 2016-06-16 | Disposition: A | Discharge status: Home / Self Care | Payer: Medicare Other | Source: Ambulatory Visit | Attending: Internal Medicine | Admitting: Internal Medicine

## 2016-06-16 ENCOUNTER — Other Ambulatory Visit (INDEPENDENT_AMBULATORY_CARE_PROVIDER_SITE_OTHER): Payer: Self-pay | Admitting: Internal Medicine

## 2016-06-16 ENCOUNTER — Inpatient Hospital Stay: Payer: Medicare Other | Attending: Obstetrics & Gynecology

## 2016-06-16 DIAGNOSIS — Z1231 Encounter for screening mammogram for malignant neoplasm of breast: Secondary | ICD-10-CM

## 2016-06-16 DIAGNOSIS — M8589 Other specified disorders of bone density and structure, multiple sites: Secondary | ICD-10-CM | POA: Insufficient documentation

## 2016-06-16 DIAGNOSIS — M8580 Other specified disorders of bone density and structure, unspecified site: Secondary | ICD-10-CM

## 2016-06-16 DIAGNOSIS — Z13820 Encounter for screening for osteoporosis: Secondary | ICD-10-CM | POA: Insufficient documentation

## 2016-06-16 DIAGNOSIS — M6281 Muscle weakness (generalized): Secondary | ICD-10-CM

## 2016-06-16 DIAGNOSIS — R531 Weakness: Secondary | ICD-10-CM

## 2016-06-16 NOTE — PT/OT Therapy Note (Signed)
INITIAL EVALUATION (Lumbar)      Name: Michele [name] Age: 75 y.o. Occupation: retired SOC: 06/16/2016  Referring Physician:   MD recheck: prn DOS: N/A DOI:  06/02/2016   Diagnosis (Treating/Medical): The encounter diagnosis was Generalized weakness.   # of Authorized Visits:   Visit #   today     SUBJECTIVE: Weakness in B LEs and pain in B hips and shakiness, started 1 year ago and getting progressively worse. Seen 2 neurologists, had MRI and was told it can only be fixed with surgery. Then was told it was in her head. Numbness and tingling bilateral LEs distal to bilateral knees all the way into feet, was having sharp pains into LEs but that has gone away.     Aggravating: standing for > 20 min has to sit down  (shaking gets worse and into whole body), sitting > 10 min (burning, tingling pressure in LEs), uses UE support for stairs, turning over in the bed hurts hips   Relieving: Icy hot cream allows pt to sleep at night, movement / walking decreased burning and shaking     Patient's reason for seeking PT /Patient's Goals:   being able to stand long enough do cook etc    Past Medical History:   Past Medical History:   Diagnosis Date   . Abnormal EKG    . Arthralgia    . Barrett's esophagus    . Cervical spinal stenosis    . Colonic polyp    . Depression    . Disc disorder    . Gastroesophageal reflux disease    . Lower back pain    . Numbness and tingling of both legs 02/06/2015   . Osteoarthritis    . Osteopenia      Medications: No outpatient prescriptions have been marked as taking for the 06/16/16 encounter (Clinical Support) with Algis Greenhouse, PT.        Other Treatment/Prior Therapy: yes for back 1-2 years ago  Prior Hospitalization:   did not ask  Hand Dominance:did not ask   Involved Side:   Tests:      see epic    Outcome Measure:         LEFS 30% Pain Score: 50%   Rate Satisfaction with Current Function: 0/10  PLOF:  progressively worsening over past year  Living Environment: 2 level home         Patient lives with:  husband    OBJECTIVE:      Observation/Posture/Gait/Integumentary  IC L>R  Pt WBs on R LE in std - if asked to WB on L LE, shaking increases     Gait: decreased pelvis AE/PD bilaterally     Integumentary: No wound, lesion or rash noted    AROM L/S standing:    Flexion: (" fingers from floor) = pain L glute, poor curve reversal  Extension = WNL, p! In LB  Sidebending  R  Fingertips to: [facility]    L  Fingertips to: [facility]    Pelvic Shear  Limited and painful bilatrlly     Standing LPM: ( /5)    R flexion 1 sluggish     L extension 3        L flexion 3    R extension 3           MMT = /5 IE R IE L   L1/2 Hip Flex  Core stability 4 4+   Hip IR     Hip ER  Hip abd     Hip add     Knee flexion   4 5   Knee extension 4 5   L4 Ankle DF 4+ 5   L5 Toe Ex 5 5   Ankle PF 5 4+   (blank fields were intentionally left blank)      Hip PROM in supine  R hip flexion 100 with p! In LB, ER 20 IR 10  L hip flex 100 ER 30 p! IR 25 p!      Special Tests/Neurological Screen:  Reflexes:    (+) FABER bilaterally  (-) FADIR bilaterally    (+) HISL on L for decreased hip flexion     Neural tension: Slump Sitting: (-)     Extension Sitting: (-)                      Treatment Initial Visit:  Evaluation   Patient Education on exam findings, POC, role of PT  Therapeutic exercise with instruction in HEP N/A   Manual N/A  Modalities: N/A  Barriers to rehabilitation: None  Is patient aware of diagnosis: Yes    Roosvelt Harps, DPT [id number]   06/16/2016    Total Treatment (Billable) Time:  40  Total Timed Minutes: 10    Plan of Care  [employer] Medicare Provider #: [phone]                Patient Name: Michele [name]  MRN: [id number]  [facility]IC[facility] Medicare #: Medicare Sub. Num: [id number] A  DOI:06/02/2016   DOS: N/A  SOC: 06/16/2016     Diagnosis:     ICD-10-CM    1. Generalized weakness R53.1          G Codes: Evaluation / Current: A6301 Body Position CL (60-80) Goal: S0109 Body PositionCJ (20-40)  Primary Functional Deficit: Mobility: Walking and  Moving Around G-codes determined by: LEFS    ASSESSMENT: the patient is a 75 y.o. female presenting with B LE weakness and B hip pain who requires Physical Therapy for the following:  Impairments: decreased hip ROM, decreased lumbopelvic ROM, decreased LE strength, inefficient posture and ACE    Barriers to Rehabilitation/Comorbidities/personal  factors:   Time since onset of injury/illness/exacerbation ,    Pain located: B hips    Clinical presentation: evolving pt reports gradual worsening     Functional Limitations (PLOF): standing for > 20 min has to sit down  (shaking gets worse and into whole body), sitting > 10 min (burning, tingling pressure in LEs), uses UE support for stairs, turning over in the bed hurts hips       Plan Of Care: Body Mechanics Education, NMR, Proprioceptive Activites, Instruction in HEP, Therapeutic Exercise, Therapeutic Activities, Balance/Gait training and Soft Tissue/Joint Mobilization thoracic spine, lumbar spine, pelvic girdle, B hip joints, B LEs  Frequency/Duration: 2 times a week for 14 sessions. Certification Status Ends: 09/11/2016    Goals:   Goal Status   Date (Body Area, Impairment Goal, Functional   Activity, Target Performance) Time Frame Status Date/  Initial   06/16/16   LEFS >/= 50%  14 sessions Initial Eval     06/16/16   LE MMT 5/5 to allow pt ascend/descend stairs without use of UE  14 sessions Initial Eval    06/16/16   LPM >/= 3/5 all directions to allow pt so std x 30 min for cooking painfree  14 sessions Initial Eval    06/16/16  Improve B hip ROM by 10  deg ea to allow pt to roll in bed painfree  14 sessions Initial Eval    06/16/16   Pt will demo neutral stacked alignment in sitting in appropriate self selected seat height with use of supports as needed to allow sitting daily for ADL's without limitations  14 sessions Initial Eval      Signature: Roosvelt Harps, DPT [id number]   Date: 06/16/2016    Signature:   ___________________________ Date: ____________    Patient  Name: Michele [name]  MRN: [id number]

## 2016-06-16 NOTE — PT/OT Plan of Care (Signed)
Plan of Care  [employer] Medicare Provider #: [phone]                Patient Name: Michele [name]  MRN: [id number]  [facility] Medicare #: Medicare Sub. Num: [id number] A  DOI:06/02/2016   DOS: N/A  SOC: 06/16/2016     Diagnosis:     ICD-10-CM    1. Generalized weakness R53.1          G Codes: Evaluation / Current: D1761 Body Position CL (60-80) Goal: Y0737 Body PositionCJ (20-40)  Primary Functional Deficit: Mobility: Walking and Moving Around G-codes determined by: LEFS    ASSESSMENT: the patient is a 75 y.o. female presenting with B LE weakness and B hip pain who requires Physical Therapy for the following:  Impairments: decreased hip ROM, decreased lumbopelvic ROM, decreased LE strength, inefficient posture and ACE    Barriers to Rehabilitation/Comorbidities/personal  factors:   Time since onset of injury/illness/exacerbation ,    Pain located: B hips    Clinical presentation: evolving pt reports gradual worsening     Functional Limitations (PLOF): standing for > 20 min has to sit down  (shaking gets worse and into whole body), sitting > 10 min (burning, tingling pressure in LEs), uses UE support for stairs, turning over in the bed hurts hips       Plan Of Care: Body Mechanics Education, NMR, Proprioceptive Activites, Instruction in HEP, Therapeutic Exercise, Therapeutic Activities, Balance/Gait training and Soft Tissue/Joint Mobilization thoracic spine, lumbar spine, pelvic girdle, B hip joints, B LEs  Frequency/Duration: 2 times a week for 14 sessions. Certification Status Ends: 09/11/2016    Goals:   Goal Status   Date (Body Area, Impairment Goal, Functional   Activity, Target Performance) Time Frame Status Date/  Initial   06/16/16   LEFS >/= 50%  14 sessions Initial Eval     06/16/16   LE MMT 5/5 to allow pt ascend/descend stairs without use of UE  14 sessions Initial Eval    06/16/16   LPM >/= 3/5 all directions to allow pt so std x 30 min for cooking painfree  14 sessions Initial Eval    06/16/16  Improve B hip ROM by 10  deg ea to allow pt to roll in bed painfree  14 sessions Initial Eval    06/16/16   Pt will demo neutral stacked alignment in sitting in appropriate self selected seat height with use of supports as needed to allow sitting daily for ADL's without limitations  14 sessions Initial Eval      Signature: Roosvelt Harps, DPT [id number]   Date: 06/16/2016    Signature:   ___________________________ Date: ____________    Patient Name: Michele [name]  MRN: [id number]

## 2016-06-19 ENCOUNTER — Inpatient Hospital Stay: Payer: Medicare Other

## 2016-06-19 DIAGNOSIS — M6281 Muscle weakness (generalized): Secondary | ICD-10-CM

## 2016-06-19 NOTE — PT/OT Therapy Note (Signed)
DAILY NOTE  06/19/2016       Total Treatment (Billable) Time: 42  Total Timed Minutes: 42 Visit Number:  2    Certification Status Ends: 03-Oct-2016   G-Codes required on visit # 10  KX required on visit: 17    Payor: MEDICARE / Plan: MEDICARE PART A AND B / Product Type: *No Product type* /   # of Authorized Visits:   Visit #:      Diagnosis (Treating/Medical):     ICD-10-CM    1. Generalized weakness R53.1            Subjective:  Michele [name] reports "Same thing. Today is the norm."    Objective:   Treatment:  Therapeutic Exercise:   NA    NMR:    Praying mantis    Therapeutic Activities:    NA    Manual Therapy:   BCC bilateral l/s spinal groove, iliac crests, SIJ, PSIS  STM l/s paraspinals, interspinous ligs, QL (L), glutes, piriformis, superficial fascia sacrum  Gapping L SIJ with resisted hip ER  Prone PA L sacrum with hip IR/ER      Initial Evaluation Reference and/orCurrent Measurements (ROM, Strength, Girth, Outcomes, etc.):      Hypomobile PA L sacrum    Modalities: None  Therapy Rationale: Other: NA       Assessment (response to treatment):   Pt with feelings of decreased stiffness after tx today. Addressed soft tissue and joint restrictions bilaterally (L>R) through l/s and pelvis.    Progress towards functional goals: see assessment    Patient requires continued skilled care to: address decreased hip ROM, decreased lumbopelvic ROM, decreased LE strength, inefficient posture and ACE    Plan:  Continue with Plan of Care    Santiago Glad, PT, DPT [place] [phone]  06/19/2016

## 2016-06-20 ENCOUNTER — Other Ambulatory Visit: Payer: Self-pay | Admitting: Obstetrics & Gynecology

## 2016-06-20 ENCOUNTER — Ambulatory Visit
Admission: RE | Admit: 2016-06-20 | Discharge: 2016-06-20 | Disposition: A | Discharge status: Home / Self Care | Payer: Medicare Other | Source: Ambulatory Visit | Attending: Obstetrics & Gynecology | Admitting: Obstetrics & Gynecology

## 2016-06-20 DIAGNOSIS — R102 Pelvic and perineal pain: Secondary | ICD-10-CM

## 2016-06-27 ENCOUNTER — Inpatient Hospital Stay: Payer: Medicare Other

## 2016-06-27 DIAGNOSIS — M6281 Muscle weakness (generalized): Secondary | ICD-10-CM

## 2016-06-27 NOTE — PT/OT Therapy Note (Signed)
DAILY NOTE  06/27/2016       Total Treatment (Billable) Time: 40  Total Timed Minutes: 40 Visit Number:  3    Certification Status Ends: 09-24-16   G-Codes required on visit # 10  KX required on visit: 17    Payor: MEDICARE / Plan: MEDICARE PART A AND B / Product Type: *No Product type* /   # of Authorized Visits: 14 Visit #: 3    Diagnosis (Treating/Medical):     ICD-10-CM    1. Generalized weakness R53.1            Subjective:  The pain got better, the shaking is the same. L hip pain currently.     Objective:   Treatment:  Therapeutic Exercise:   NA    NMR:    Free the ball - added to HEP  End range ER and IR PH and COI L hip in prone    Therapeutic Activities:    NA    Manual Therapy:   Patient in prone over pillows  Superficial fascia bilateral thoracic and lumbar paraspinals  Bony contours spinal groove, pelvic crest  Strumming and perpendicular deformation of bilateral paraspinals  Superficial fascia over sacrum and SI jts   P/a L sc jt and sacrum  Inf glide R sacrum with LTR to R  R hip on axis ER   S/L to improve pelvic mobility and ROM into PD  Bony contours pelvic crest, inferior ribcage, lumbar spinal groove  STM QL, lumbar paraspinals, iliacus      Initial Evaluation Reference and/orCurrent Measurements (ROM, Strength, Girth, Outcomes, etc.):        Modalities: None  Therapy Rationale: Other: NA     Pelvic shear limited >75% and painful bilaterlly       Assessment (response to treatment):   Pt able to std/walk without pain in L hip at end of session but with cont sig restrictions L sacrum; limited/painful PD L pelvis.     Progress towards functional goals:   Goals:   Goal Status   Date (Body Area, Impairment Goal, Functional   Activity, Target Performance) Time Frame Status Date/  Initial   06/16/16   LEFS >/= 50%  14 sessions Initial Eval     06/16/16   LE MMT 5/5 to allow pt ascend/descend stairs without use of UE  14 sessions Initial Eval    06/16/16   LPM >/= 3/5 all directions to allow pt so std x 30  min for cooking painfree  14 sessions Pt able to std painfree at end of session 06/27/2016 MM   06/16/16  Improve B hip ROM by 10 deg ea to allow pt to roll in bed painfree  14 sessions Initial Eval    06/16/16   Pt will demo neutral stacked alignment in sitting in appropriate self selected seat height with use of supports as needed to allow sitting daily for ADL's without limitations  14 sessions Initial Eval        Patient requires continued skilled care to: address decreased hip ROM, decreased lumbopelvic ROM, decreased LE strength, inefficient posture and ACE    Plan:  Continue with Plan of Care    [place], [place] XL#[id number]   06/27/2016

## 2016-07-01 ENCOUNTER — Inpatient Hospital Stay: Payer: Medicare Other

## 2016-07-01 DIAGNOSIS — M6281 Muscle weakness (generalized): Secondary | ICD-10-CM

## 2016-07-01 NOTE — PT/OT Therapy Note (Signed)
DAILY NOTE  07/01/2016       Total Treatment (Billable) Time: 40  Total Timed Minutes: 40 Visit Number:  4    Certification Status Ends: 2016/09/17   G-Codes required on visit # 10  KX required on visit: 17    Payor: MEDICARE / Plan: MEDICARE PART A AND B / Product Type: *No Product type* /   # of Authorized Visits: 14 Visit #: 4    Diagnosis (Treating/Medical):     ICD-10-CM    1. Generalized weakness R53.1            Subjective:  I am feeling much better. The pain in the left hip is almost gone. The left thigh gets tired still. Currently the right is hurting more because the left is better.     Objective:   Treatment:  Therapeutic Exercise:   Supine bridges added to HEP - 5 reps only 2 to HS cramping     NMR:    NA    Therapeutic Activities:    NA    Manual Therapy:   Supine  STM L abdominal contents with LTR  Bony contours inferior border ribcage and anterior/lateral pelvic crest  STM lateral borders of rectus abdominis  STM iliacus with heel slide and AROM hip ER/IR  STM L quad  L hip inf glide with strap - d/c 2 to p!    Patient in prone over pillows  Superficial fascia over sacrum and SI jts   P/a L sc jt and sacrum  L to R mobs coccyx with L hip Er/IR AROM          Initial Evaluation Reference and/orCurrent Measurements (ROM, Strength, Girth, Outcomes, etc.):        Modalities: None  Therapy Rationale: Other: NA     Coccyx L rot, L SB    Assessment (response to treatment):   Pt with improving pelvic girdle/hip mobility but with difficulty completing set of bridges, fatigues easily and reports HS cramping after 1-2 reps. Advised pt to try at home as able, focus on glute squeeze and avoid cramping. Re-assess next session.     Progress towards functional goals:   Goals:   Goal Status   Date (Body Area, Impairment Goal, Functional   Activity, Target Performance) Time Frame Status Date/  Initial   06/16/16   LEFS >/= 50%  14 sessions Initial Eval     06/16/16   LE MMT 5/5 to allow pt ascend/descend stairs without use  of UE  14 sessions Initial Eval    06/16/16   LPM >/= 3/5 all directions to allow pt so std x 30 min for cooking painfree  14 sessions Pt able to std painfree at end of session 06/27/2016 MM   06/16/16  Improve B hip ROM by 10 deg ea to allow pt to roll in bed painfree  14 sessions STM to hip flexors and quad to improve hip mobility 07/01/2016 MM   06/16/16   Pt will demo neutral stacked alignment in sitting in appropriate self selected seat height with use of supports as needed to allow sitting daily for ADL's without limitations  14 sessions Initial Eval        Patient requires continued skilled care to: address decreased hip ROM, decreased lumbopelvic ROM, decreased LE strength, inefficient posture and ACE    Plan:  Continue with Plan of Care    [place], [place] NW#[id number]   07/01/2016

## 2016-07-04 ENCOUNTER — Inpatient Hospital Stay: Payer: Medicare Other

## 2016-07-04 DIAGNOSIS — M6281 Muscle weakness (generalized): Secondary | ICD-10-CM

## 2016-07-04 NOTE — PT/OT Therapy Note (Signed)
DAILY NOTE  07/04/2016       Total Treatment (Billable) Time: 40  Total Timed Minutes: 40 Visit Number:  5    Certification Status Ends: 2016-10-11   G-Codes required on visit # 10  KX required on visit: 17    Payor: MEDICARE / Plan: MEDICARE PART A AND B / Product Type: *No Product type* /   # of Authorized Visits: 14 Visit #: 5    Diagnosis (Treating/Medical):     ICD-10-CM    1. Generalized weakness R53.1            Subjective:  What you are doing is helping. Still have a long way to go but doing better. L is better, R is a little better. More pain on R than L now.     Objective:   Treatment:  Therapeutic Exercise:   Review of supine bridges - unable 2/2 cramping  Tried unilateral bracing - unable 2/3/ bracing  Supine pelvic tilts    Access Code: [email or web address]   URL: [email or web address]   Date: 07/04/2016   Prepared by: Court Joy Hutchinson     Exercises   Supine Pelvic Tilt - 15 reps - 2x daily       NMR:    R Pelvic posterior depression with prolonged end range holds and COI, followed by  Pelvic PD with LE ext/ abd/IR with end range holds and COI to improve weight acceptance into LE    Supine hooklying traction for core initiation to progress to bridging with assist    Therapeutic Activities:    NA    Manual Therapy:   S/L to improve pelvic mobility and ROM into R PD  Bony contours pelvic crest, inferior ribcage, lumbar spinal groove  STM QL, lumbar paraspinals, iliacus  Gapping L3-4-5-S1 with resisted AE  Inf glide sacrum with hip IR in S/L      Initial Evaluation Reference and/orCurrent Measurements (ROM, Strength, Girth, Outcomes, etc.):   Hip abd R 4/5 L 4-/5  Hip ER R 4+/5 L 4/5  Hip ext R 0 p! In LB, L 15  Decreased ROM/p! Into PD on R    Assessment (response to treatment):   Pt with R hip extension painfree to 15 deg after above MT. Improved PD ROM and control on R but unable to add LE to PNF because pt unable to keep glute/hip engaged, pt still unable to do bridges without HS cramping, also gets  cramping with unilateral bracing so told pt to hold off on bridgeing and bracing and added pelvic tilt instead. Pt very fatigued at end of session    Progress towards functional goals:   Goals:   Goal Status   Date (Body Area, Impairment Goal, Functional   Activity, Target Performance) Time Frame Status Date/  Initial   06/16/16   LEFS >/= 50%  14 sessions Initial Eval     06/16/16   LE MMT 5/5 to allow pt ascend/descend stairs without use of UE  14 sessions Initial Eval    06/16/16   LPM >/= 3/5 all directions to allow pt so std x 30 min for cooking painfree  14 sessions Pt able to std painfree at end of session 06/27/2016 MM   06/16/16  Improve B hip ROM by 10 deg ea to allow pt to roll in bed painfree  14 sessions R hip ext improved to 15 deg 07/04/2016 MM   06/16/16   Pt will demo neutral stacked alignment in sitting in appropriate self selected  seat height with use of supports as needed to allow sitting daily for ADL's without limitations  14 sessions Initial Eval        Patient requires continued skilled care to: address decreased hip ROM, decreased lumbopelvic ROM, decreased LE strength, inefficient posture and ACE    Plan:  Continue with Plan of Care    [place], [place] RU#0454   07/04/2016

## 2016-07-08 ENCOUNTER — Inpatient Hospital Stay: Payer: Medicare Other | Attending: Obstetrics & Gynecology

## 2016-07-08 DIAGNOSIS — M6281 Muscle weakness (generalized): Secondary | ICD-10-CM | POA: Insufficient documentation

## 2016-07-08 NOTE — PT/OT Therapy Note (Signed)
DAILY NOTE  07/08/2016       Total Treatment (Billable) Time: 15  Total Timed Minutes: 15 Visit Number:  6  Pt late    Certification Status Ends: 02-Oct-2016   G-Codes required on visit # 10  KX required on visit: 17    Payor: MEDICARE / Plan: MEDICARE PART A AND B / Product Type: *No Product type* /   # of Authorized Visits: 14 Visit #: 6    Diagnosis (Treating/Medical):     ICD-10-CM    1. Generalized weakness R53.1            Subjective:  It is easier to go up stairs. Pain comes and goes.     Objective:   Treatment:  Therapeutic Exercise:   Review of supine bridges - unable 2/2 cramping  Tried unilateral bracing - unable 2/3/ bracing  Supine pelvic tilts    Access Code: [email or web address]   URL: [email or web address]   Date: 07/04/2016   Prepared by: Court Joy Hutchinson     Exercises   Supine Pelvic Tilt - 15 reps - 2x daily       NMR:    Supine resisted LE (with LEs on SB) with hip flexion/ adduction/ER to facilitate core contraction with prolonged holds and with irradiation from B UE chop pattern    Therapeutic Activities:    NA    Manual Therapy:   STM B abd contents, lateral border rectus   With NMR      Initial Evaluation Reference and/orCurrent Measurements (ROM, Strength, Girth, Outcomes, etc.):     Hip flexion MMT 5/5 bilaterally at end of session (vs R 4/5, L 4+/5)    Pulled fw:  Hip abd R 4/5 L 4-/5  Hip ER R 4+/5 L 4/5  Hip ext R 0 p! In LB, L 15  Decreased ROM/p! Into PD on R    Assessment (response to treatment):   Pt able to tolerate above NMR for core initiation without any c/o cramping (last session immediate thigh cramping with attempted unilateral bracing) and pt with improved hip flexion MMT at end of session. Pt c/o pain in bilateral SIJ are at end of session however unable to treat this time b/c of time constraints (pt 25 min late)    Progress towards functional goals:   Goals:   Goal Status   Date (Body Area, Impairment Goal, Functional   Activity, Target Performance) Time Frame Status Date/  Initial    06/16/16   LEFS >/= 50%  14 sessions Initial Eval     06/16/16   LE MMT 5/5 to allow pt ascend/descend stairs without use of UE  14 sessions Improved 07/08/2016 MM   06/16/16   LPM >/= 3/5 all directions to allow pt so std x 30 min for cooking painfree  14 sessions Pt able to std painfree at end of session 06/27/2016 MM   06/16/16  Improve B hip ROM by 10 deg ea to allow pt to roll in bed painfree  14 sessions R hip ext improved to 15 deg 07/04/2016 MM   06/16/16   Pt will demo neutral stacked alignment in sitting in appropriate self selected seat height with use of supports as needed to allow sitting daily for ADL's without limitations  14 sessions Initial Eval        Patient requires continued skilled care to: address decreased hip ROM, decreased lumbopelvic ROM, decreased LE strength, inefficient posture and ACE    Plan:  Continue with Plan of Care  Roosvelt Harps, [place] YQ#[id number]   07/08/2016

## 2016-07-10 ENCOUNTER — Inpatient Hospital Stay: Payer: Medicare Other

## 2016-07-14 ENCOUNTER — Inpatient Hospital Stay: Payer: Medicare Other

## 2016-07-14 DIAGNOSIS — R531 Weakness: Secondary | ICD-10-CM

## 2016-07-14 DIAGNOSIS — M6281 Muscle weakness (generalized): Secondary | ICD-10-CM | POA: Insufficient documentation

## 2016-07-14 NOTE — PT/OT Therapy Note (Signed)
Change request made to billing/coding team to correct diagnosis code selection made at time of service.    Please remove ICD-10 code R53.1  and add ICD-10 code M62.81              Roosvelt Harps, DPT [phone]

## 2016-07-14 NOTE — PT/OT Therapy Note (Signed)
Change request made to billing/coding team to correct wrong Gcode selection during charge entry on this visit.   Gcode selection should be as follows:   G Codes: Evaluation / Current: [id number] Body Position CL (60-80) Goal: Z6109 Body PositionCJ (20-40)               Roosvelt Harps, DPT [phone]

## 2016-07-14 NOTE — PT/OT Therapy Note (Addendum)
DAILY NOTE  07/14/2016       Total Treatment (Billable) Time: 40  Total Timed Minutes: 40 Visit Number:  7 (2 for [id number])      Certification Status Ends: 09/18/16   G-Codes required on visit # 10  KX required on visit: 17    Payor: MEDICARE / Plan: MEDICARE PART A AND B / Product Type: *No Product type* /   # of Authorized Visits: 14 Visit #: 7    Diagnosis (Treating/Medical):     ICD-10-CM    1. Generalized weakness R53.1            Subjective: The pain comes and goes, no change since last session      Objective:   Treatment:  Therapeutic Exercise:   NA    Access Code: [email or web address]   URL: [email or web address]   Date: 07/04/2016   Prepared by: Court Joy Hutchinson     Exercises   Supine Pelvic Tilt - 15 reps - 2x daily       NMR:    Unilateral bracing     Therapeutic Activities:    NA    Manual Therapy:   Patient in prone over pillows  Superficial fascia bilateral thoracic and lumbar paraspinals  Bony contours spinal groove, pelvic crest  Strumming and perpendicular deformation of bilateral paraspinals  Superficial fascia over sacrum and SI jts   P/a L sacrum  MET for L ant rot innom  Supine STM abd content,s lat border rectus, iliacus with LTR      Initial Evaluation Reference and/orCurrent Measurements (ROM, Strength, Girth, Outcomes, etc.):     Shear R 25% p! L 50% p!   Sacrum L rotated   L LE shorter in supine - level after MET    Outcomes  LEFS 35% vs 30% at IE  Assessment (response to treatment):   Pt again with minimal cramping with unilateral bracing. PT reports hx of of lower abdomen pains for many years and with sig restrictions lower abdomen    Progress towards functional goals:   Goals:   Goal Status   Date (Body Area, Impairment Goal, Functional   Activity, Target Performance) Time Frame Status Date/  Initial   06/16/16   LEFS >/= 50%  14 sessions LEFS 35% vs 30% at IE  07/14/2016 MM   06/16/16   LE MMT 5/5 to allow pt ascend/descend stairs without use of UE  14 sessions Improved 07/08/2016 MM   06/16/16    LPM >/= 3/5 all directions to allow pt so std x 30 min for cooking painfree  14 sessions Pt able to std painfree at end of session 06/27/2016 MM   06/16/16  Improve B hip ROM by 10 deg ea to allow pt to roll in bed painfree  14 sessions R hip ext improved to 15 deg 07/04/2016 MM   06/16/16   Pt will demo neutral stacked alignment in sitting in appropriate self selected seat height with use of supports as needed to allow sitting daily for ADL's without limitations  14 sessions Initial Eval        Patient requires continued skilled care to: address decreased hip ROM, decreased lumbopelvic ROM, decreased LE strength, inefficient posture and ACE    Plan:  Continue with Plan of Care    [place], [place] WE#[id number]   07/14/2016

## 2016-07-16 ENCOUNTER — Inpatient Hospital Stay: Payer: Medicare Other

## 2016-07-16 DIAGNOSIS — M6281 Muscle weakness (generalized): Secondary | ICD-10-CM

## 2016-07-16 DIAGNOSIS — R531 Weakness: Secondary | ICD-10-CM

## 2016-07-16 NOTE — PT/OT Therapy Note (Signed)
DAILY NOTE  07/16/2016       Total Treatment (Billable) Time: 42  Total Timed Minutes: 42 Visit Number:  8 (3 for [id number])      Certification Status Ends: 09-12-16   G-Codes required on visit # 10  KX required on visit: 17    Payor: MEDICARE / Plan: MEDICARE PART A AND B / Product Type: Medicare /   # of Authorized Visits: 14 Visit #: 8    Diagnosis (Treating/Medical):     ICD-10-CM    1. Muscle weakness M62.81    2. Generalized weakness R53.1            Subjective: The thighs are really bothering me (points to lat hips and post low back/sacrum). Both the same amount."    Objective:   Treatment:  Therapeutic Exercise:   NA    NMR:    Bilateral praying mantis  Seated bracing    Therapeutic Activities:    NA    Manual Therapy:   Patient in prone over pillows  Superficial fascia sacrum  BCC sacral sulcus, SIJ  PA L sacrum with hip ER/IR  STM L deep rotators, glutes  MET for R rotated L5      Initial Evaluation Reference and/orCurrent Measurements (ROM, Strength, Girth, Outcomes, etc.):     Shear R 25% p! L 50% p!   Sacrum L rotated   L LE shorter in supine - level after MET    Outcomes  LEFS 35% vs 30% at IE  Assessment (response to treatment):   Pt reports decrease in R sacral pain with walking after MET performed to correct R rotated L5. Pt still presents with L rotated sacrum, improving with MT, though still lacking equal springy end feel. L hip more posterior, but positioned more anterior after MT in prone. L LE shakes > R, most notably with L WB - may benefit from L pelvic PD NMR to improve weight acceptance without reproduction of shaking.    Progress towards functional goals:   Goals:   Goal Status   Date (Body Area, Impairment Goal, Functional   Activity, Target Performance) Time Frame Status Date/  Initial   06/16/16   LEFS >/= 50%  14 sessions LEFS 35% vs 30% at IE  07/14/2016 MM   06/16/16   LE MMT 5/5 to allow pt ascend/descend stairs without use of UE  14 sessions Improved 07/08/2016 MM   06/16/16   LPM >/= 3/5  all directions to allow pt so std x 30 min for cooking painfree  14 sessions Pt able to std painfree at end of session 06/27/2016 MM   06/16/16  Improve B hip ROM by 10 deg ea to allow pt to roll in bed painfree  14 sessions R hip ext improved to 15 deg 07/04/2016 MM   06/16/16   Pt will demo neutral stacked alignment in sitting in appropriate self selected seat height with use of supports as needed to allow sitting daily for ADL's without limitations  14 sessions Initial Eval        Patient requires continued skilled care to: address decreased hip ROM, decreased lumbopelvic ROM, decreased LE strength, inefficient posture and ACE    Plan:  Continue with Plan of Care    Santiago Glad, PT, DPT [place] [phone]   07/16/2016

## 2016-07-21 ENCOUNTER — Inpatient Hospital Stay: Payer: Medicare Other | Admitting: Rehabilitative and Restorative Service Providers"

## 2016-07-21 DIAGNOSIS — M6281 Muscle weakness (generalized): Secondary | ICD-10-CM

## 2016-07-21 DIAGNOSIS — R531 Weakness: Secondary | ICD-10-CM

## 2016-07-21 NOTE — PT/OT Therapy Note (Signed)
DAILY NOTE  07/21/2016       Total Treatment (Billable) Time: 42  Total Timed Minutes: 42 Visit Number:  9 (4 for 1308)      Certification Status Ends: 09-17-16   G-Codes required on visit # 10  KX required on visit: 17    Payor: MEDICARE / Plan: MEDICARE PART A AND B / Product Type: Medicare /   # of Authorized Visits: 14 Visit #: 9    Diagnosis (Treating/Medical):     ICD-10-CM    1. Muscle weakness M62.81    2. Generalized weakness R53.1            Subjective: Still a lot of pain and soreness in the thighs; when asked about the "shakiness" she states it is always worse in standing- -some days worse then others; lying down it goes away and sitting is mostly better.    Objective:   Treatment:  Therapeutic Exercise:   NA    NMR:    Weight bearing in hooklying with approximation and distraction- attempted various combinations of approximation and distraction to include ab/add forces- progressed toward bridge with anterior translation of knees emphasized noting ability to reduce "shaking" although sometimes temporarily at times.    Supine resisted LE with hip flexion/ adduction/ER to facilitate core contraction with prolonged holds and with irradiation from B UE chop pattern- first attempted LE only, UE only and magnetic click from irradiation from UE into LE    Seated bracing    Seated and Standing weight shifts with approximation through pelvis and trunk for core facilitation     Therapeutic Activities:    NA    Manual Therapy:   Gentle STM to bilateral LE to include quads, ITB, inguinal ligament, abdominal musculature       Initial Evaluation Reference and/orCurrent Measurements (ROM, Strength, Girth, Outcomes, etc.):     Shear R 25% p! L 50% p!   Sacrum L rotated   L LE shorter in supine - level after MET    Outcomes  LEFS 35% vs 30% at IE  Assessment (response to treatment):   Able to reduce "shaking" with various positions and irradiations through core; shakiness less apparent with extension facilitation in  general with patient is stronger through however symptoms possibly due to poor flex vs extension facilitation; pain and soreness of the LE likely musculoskeletal in nature due to uncontrollable shakiness therefore this must be addressed concurrently.  Patient advised to follow up with MD again regarding further consultation specifically for the symptoms of "shakiness"      Progress towards functional goals:   Goals:   Goal Status   Date (Body Area, Impairment Goal, Functional   Activity, Target Performance) Time Frame Status Date/  Initial   06/16/16   LEFS >/= 50%  14 sessions LEFS 35% vs 30% at IE  07/14/2016 MM   06/16/16   LE MMT 5/5 to allow pt ascend/descend stairs without use of UE  14 sessions Improved 07/08/2016 MM   06/16/16   LPM >/= 3/5 all directions to allow pt so std x 30 min for cooking painfree  14 sessions Pt able to std painfree at end of session 06/27/2016 MM   06/16/16  Improve B hip ROM by 10 deg ea to allow pt to roll in bed painfree  14 sessions R hip ext improved to 15 deg 07/04/2016 MM   06/16/16   Pt will demo neutral stacked alignment in sitting in appropriate self selected seat height with  use of supports as needed to allow sitting daily for ADL's without limitations  14 sessions Initial Eval        Patient requires continued skilled care to: address decreased hip ROM, decreased lumbopelvic ROM, decreased LE strength, inefficient posture and ACE    Plan:  Continue with Plan of Care    Charlies Silvers Sickle PT, DPT, CFMT [phone]    07/21/2016

## 2016-07-23 ENCOUNTER — Inpatient Hospital Stay: Payer: Medicare Other

## 2016-07-24 ENCOUNTER — Inpatient Hospital Stay: Payer: Medicare Other

## 2016-07-24 DIAGNOSIS — M6281 Muscle weakness (generalized): Secondary | ICD-10-CM

## 2016-07-24 DIAGNOSIS — R531 Weakness: Secondary | ICD-10-CM

## 2016-07-24 NOTE — PT/OT Therapy Note (Addendum)
DAILY NOTE  07/24/2016       Total Treatment (Billable) Time: 42  Total Timed Minutes: 42 Visit Number:  10 (5 for [id number])      Certification Status Ends: 10-11-2016   G-Codes required on visit # 10  KX required on visit: 17    Payor: MEDICARE / Plan: MEDICARE PART A AND B / Product Type: Medicare /   # of Authorized Visits: 14 Visit #: 10    Diagnosis (Treating/Medical):     ICD-10-CM    1. Muscle weakness M62.81    2. Generalized weakness R53.1            Subjective:  "My back feels much better. I just have this pain in my hips on the side here."    Objective:   Treatment:  Therapeutic Exercise:   Review of current HEP- ab series position 2, bridging  Bridge with anterior translation of knees and resistance through knees    NMR:    NA    Therapeutic Activities:    Seated weight acceptance bilateral      Manual Therapy:   STM bilateral glutes, piriformis, SF post/lat thigh  BCC bilateral greater trochanters, iliac crests      Initial Evaluation Reference and/orCurrent Measurements (ROM, Strength, Girth, Outcomes, etc.):     Outcomes  LEFS 35% vs 30% at IE    MMT = /5 07/24/2016 R IE R 07/24/2016 L IE L   L1/2 Hip Flex  Core stability 4+ 4 4+ 4+   Hip IR       Hip ER       Hip abd       Hip add 5      Knee flexion   4+ 4 5 5    Knee extension 4 4 5 5    L4 Ankle DF 5 4+ 5 5   L5 Toe Ex 5 5 5 5    Ankle PF 5 5 5  4+   (blank fields were intentionally left blank)    G Codes: Evaluation / Current: F6213 Body Position CK (40-60) Goal: Y8657 Body PositionCJ (20-40)  Primary Functional Deficit: Changing and Maintaining Body Position G-codes determined by: Outcome measure: LEFS, pt-reported functional limitations, clinical assessment    Assessment (response to treatment):   Spoke with pt again about f/u with MD re: shakiness. Pt in agreement with need for further consultation in addition to therapy. Pt overall progressing well with gains in LE and core strength and decrease in lower back symptoms.  Patient has been seen for 10 visits  in this case for LBP and LE pain.  Patient has made excellent gains in range of motion, strength, and pain since starting physical therapy.  Patient is still having difficulty with standing prolonged periods with efficient core engagement.  Patients outcome measure has improved for LEFS since IE. Patient would benefit from continued skilled PT intervention to address these deficits and achieve the below listed goals.      Progress towards functional goals:   Goals:   Goal Status   Date (Body Area, Impairment Goal, Functional   Activity, Target Performance) Time Frame Status Date/  Initial   06/16/16   LEFS >/= 50%  14 sessions LEFS 35% vs 30% at IE  07/14/2016 MM   06/16/16   LE MMT 5/5 to allow pt ascend/descend stairs without use of UE  14 sessions Improved 07/08/2016 MM   06/16/16   LPM >/= 3/5 all directions to allow pt so std x 30  min for cooking painfree  14 sessions Pt able to std painfree at end of session 06/27/2016 MM   06/16/16  Improve B hip ROM by 10 deg ea to allow pt to roll in bed painfree  14 sessions R hip ext improved to 15 deg 07/04/2016 MM   06/16/16   Pt will demo neutral stacked alignment in sitting in appropriate self selected seat height with use of supports as needed to allow sitting daily for ADL's without limitations  14 sessions Initial Eval        Patient requires continued skilled care to: address decreased hip ROM, decreased lumbopelvic ROM, decreased LE strength, inefficient posture and ACE    Plan:  Continue with Plan of Care   L/s stm     Santiago Glad, PT, DPT [place] [phone]   07/24/2016

## 2016-07-28 ENCOUNTER — Inpatient Hospital Stay: Payer: Medicare Other

## 2016-07-28 DIAGNOSIS — R531 Weakness: Secondary | ICD-10-CM

## 2016-07-28 DIAGNOSIS — M6281 Muscle weakness (generalized): Secondary | ICD-10-CM

## 2016-07-28 NOTE — PT/OT Therapy Note (Signed)
DAILY NOTE  07/28/2016       Total Treatment (Billable) Time: 42  Total Timed Minutes: 42 Visit Number:  11 (6 for 1610)      Certification Status Ends: 2016/09/12   G-Codes required on visit # 10  KX required on visit: 17    Payor: MEDICARE / Plan: MEDICARE PART A AND B / Product Type: Medicare /   # of Authorized Visits: 14 Visit #: 11    Diagnosis (Treating/Medical):     ICD-10-CM    1. Muscle weakness M62.81    2. Generalized weakness R53.1            Subjective:  "The back is bothering me today (points to R post hip). The hips always. I'm not sure I want to stop therapy yet - we'll see how I feel after today. The treatments affect the swelling in my legs!"    Objective:   Treatment:  Therapeutic Exercise:   NA    NMR:    NA    Therapeutic Activities:    NA    Manual Therapy:   Patient in prone over pillows  Superficial fascia bilateral thoracic and lumbar paraspinals  Bony contours spinal groove, pelvic crest  Strumming and perpendicular deformation of bilateral paraspinals  Superficial fascia over sacrum and SI jts   SIJ gapping with resisted hip ER  PA L sacrum with hip IR/ER  Supine STM abd content, lat border rectus, iliacus with LTR, heel slides      Initial Evaluation Reference and/orCurrent Measurements (ROM, Strength, Girth, Outcomes, etc.):     Outcomes  LEFS 35% vs 30% at IE    MMT = /5 07/24/2016 R IE R 07/24/2016 L IE L   L1/2 Hip Flex  Core stability 4+ 4 4+ 4+   Hip IR       Hip ER       Hip abd       Hip add 5      Knee flexion   4+ 4 5 5    Knee extension 4 4 5 5    L4 Ankle DF 5 4+ 5 5   L5 Toe Ex 5 5 5 5    Ankle PF 5 5 5  4+   (blank fields were intentionally left blank)      Assessment (response to treatment):   Pt reports decreased pain after treatment today. Pt to f/u after MD appt regarding continued therapy as pt would like to conserve treatment sessions.    Progress towards functional goals:   Goals:   Goal Status   Date (Body Area, Impairment Goal, Functional   Activity, Target Performance)  Time Frame Status Date/  Initial   06/16/16   LEFS >/= 50%  14 sessions LEFS 35% vs 30% at IE  07/14/2016 MM   06/16/16   LE MMT 5/5 to allow pt ascend/descend stairs without use of UE  14 sessions Improved 07/08/2016 MM   06/16/16   LPM >/= 3/5 all directions to allow pt so std x 30 min for cooking painfree  14 sessions Pt able to std painfree at end of session 06/27/2016 MM   06/16/16  Improve B hip ROM by 10 deg ea to allow pt to roll in bed painfree  14 sessions R hip ext improved to 15 deg 07/04/2016 MM   06/16/16   Pt will demo neutral stacked alignment in sitting in appropriate self selected seat height with use of supports as needed to allow sitting daily for  ADL's without limitations  14 sessions Initial Eval        Patient requires continued skilled care to: address decreased hip ROM, decreased lumbopelvic ROM, decreased LE strength, inefficient posture and ACE    Plan:  Continue with Plan of Care     Santiago Glad, PT, DPT [place] [phone]   07/28/2016

## 2016-07-29 ENCOUNTER — Inpatient Hospital Stay: Payer: Medicare Other

## 2016-07-31 ENCOUNTER — Encounter (INDEPENDENT_AMBULATORY_CARE_PROVIDER_SITE_OTHER): Payer: Self-pay | Admitting: Internal Medicine

## 2016-07-31 ENCOUNTER — Ambulatory Visit (INDEPENDENT_AMBULATORY_CARE_PROVIDER_SITE_OTHER): Payer: Medicare Other | Admitting: Internal Medicine

## 2016-07-31 VITALS — BP 96/57 | HR 70 | Temp 98.4°F | Resp 14 | Ht 60.0 in | Wt 115.0 lb

## 2016-07-31 DIAGNOSIS — F419 Anxiety disorder, unspecified: Secondary | ICD-10-CM

## 2016-07-31 DIAGNOSIS — J449 Chronic obstructive pulmonary disease, unspecified: Secondary | ICD-10-CM

## 2016-07-31 DIAGNOSIS — M8580 Other specified disorders of bone density and structure, unspecified site: Secondary | ICD-10-CM

## 2016-07-31 DIAGNOSIS — R251 Tremor, unspecified: Secondary | ICD-10-CM

## 2016-07-31 DIAGNOSIS — G8929 Other chronic pain: Secondary | ICD-10-CM

## 2016-07-31 DIAGNOSIS — M5440 Lumbago with sciatica, unspecified side: Secondary | ICD-10-CM

## 2016-07-31 NOTE — Progress Notes (Signed)
Nursing Documentation:  Limb alert status: Patient asked and denied any limb restrictions for blood pressure/blood draws.  Has the patient seen any other providers since their last visit: no  The patient is due for spirometry and shingles vaccine, dexa, medicare wellness

## 2016-08-03 NOTE — Progress Notes (Signed)
Subjective:       Patient ID: Michele [name] is a 76 y.o. female.    HPI    c/o feeling shakiness throughout her body. Symptoms are chronic. Was referred to neurologist earlier. Saw dr. Patricia Nettle. Ruled out parkinsons or other movement disorder. Was told probable anxiety related.   Started on lexapro 5mg  once daily. Tolerating well and compliant with medication. No change in symptoms.   Also did PT which helped to improve her weakness and back pain but shakiness not improving. Sx have not worsened either.     H/o osteopenia; 12/17- dexa - slight improvement. - on ca and vitamin D supplements. Denies any fracture.     Problem   Copd (Chronic Obstructive Pulmonary Disease)    Stable. On symbicort daily and albuterol inhaler prn     11/12/15;  Spirometry - suggestive of moderate to severe Obstructive disease. FEv1- 52 %.   Chronic h/o smoking- 50 yrs. 7-11 cig/ day   CXR- normal.     Chronic Back Pain    MRI lumbar spine degenerative changes involving the lumbosacral spine. Right lateral disc protrusion at the L3-L4 level associated with right L3 nerve root impingement. 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 Annular tears involving the L4-L5 and L5-S1 intervertebral discs.(2012) not taking  Pain medications .           The following portions of the patient's history were reviewed and updated as appropriate: allergies, current medications, past family history, past medical history, past social history, past surgical history and problem list.    Review of Systems   Constitutional: Negative for chills, fatigue and fever.   Respiratory: Negative for shortness of breath.    Cardiovascular: Negative for chest pain.   Musculoskeletal: Positive for back pain. Negative for arthralgias.   Neurological: Positive for tremors. Negative for dizziness, syncope, speech difficulty, numbness and headaches.           Objective:    Physical Exam   Constitutional: She appears well-developed and  well-nourished.   Cardiovascular: Normal rate, regular rhythm and normal heart sounds.    Pulmonary/Chest: Effort normal and breath sounds normal. No respiratory distress. She has no wheezes.   Musculoskeletal: Normal range of motion.   Generalized involuntary movements vs tremor    Nursing note and vitals reviewed.  Blood pressure 96/57, pulse 70, temperature 98.4 F (36.9 C), temperature source Oral, resp. rate 14, height 1.524 m (5'), weight 52.2 kg (115 lb).        Assessment:       1. Shakiness  Ambulatory referral to Neurology   2. Chronic bilateral low back pain with sciatica, sciatica laterality unspecified     3. Osteopenia, unspecified location     4. Chronic obstructive pulmonary disease, unspecified COPD type     5. Anxiety             Plan:       1. Shakiness  ? Movement disorder   No improvement on PT. Anxiety medication didn't help either.   - Ambulatory referral to Neurology- Dr. Roque Cash for further evaluation     2. Chronic bilateral low back pain with sciatica, sciatica laterality unspecified  -stable. Improved with PT  Tylenol prn     3. Osteopenia, unspecified location  -stable  Continue ca and vitamin D supplements    4. Chronic obstructive pulmonary disease, unspecified COPD type  -stable  Continue current medications  5. Anxiety  -stable. Continue lexapro 5mg  once daily      F/u in 3 months or earlier prn     Procedures

## 2016-08-26 ENCOUNTER — Emergency Department: Payer: Medicare Other

## 2016-08-26 ENCOUNTER — Emergency Department
Admission: EM | Admit: 2016-08-26 | Discharge: 2016-08-26 | Disposition: A | Discharge status: Home / Self Care | Payer: Medicare Other | Attending: Emergency Medicine | Admitting: Emergency Medicine

## 2016-08-26 DIAGNOSIS — W19XXXA Unspecified fall, initial encounter: Secondary | ICD-10-CM | POA: Insufficient documentation

## 2016-08-26 DIAGNOSIS — K219 Gastro-esophageal reflux disease without esophagitis: Secondary | ICD-10-CM | POA: Insufficient documentation

## 2016-08-26 DIAGNOSIS — M8580 Other specified disorders of bone density and structure, unspecified site: Secondary | ICD-10-CM | POA: Insufficient documentation

## 2016-08-26 DIAGNOSIS — K2270 Barrett's esophagus without dysplasia: Secondary | ICD-10-CM | POA: Insufficient documentation

## 2016-08-26 DIAGNOSIS — J101 Influenza due to other identified influenza virus with other respiratory manifestations: Secondary | ICD-10-CM | POA: Insufficient documentation

## 2016-08-26 DIAGNOSIS — F17210 Nicotine dependence, cigarettes, uncomplicated: Secondary | ICD-10-CM | POA: Insufficient documentation

## 2016-08-26 DIAGNOSIS — M1990 Unspecified osteoarthritis, unspecified site: Secondary | ICD-10-CM | POA: Insufficient documentation

## 2016-08-26 LAB — CBC AND DIFFERENTIAL
Absolute NRBC: 0 10*3/uL
Basophils Absolute Automated: 0.02 10*3/uL (ref 0.00–0.20)
Basophils Automated: 0.4 %
Eosinophils Absolute Automated: 0 10*3/uL (ref 0.00–0.70)
Eosinophils Automated: 0 %
Hematocrit: 39.8 % (ref 37.0–47.0)
Hgb: 13.3 g/dL (ref 12.0–16.0)
Immature Granulocytes Absolute: 0.01 10*3/uL
Immature Granulocytes: 0.2 %
Lymphocytes Absolute Automated: 0.48 10*3/uL — ABNORMAL LOW (ref 0.50–4.40)
Lymphocytes Automated: 10.6 %
MCH: 30.6 pg (ref 28.0–32.0)
MCHC: 33.4 g/dL (ref 32.0–36.0)
MCV: 91.7 fL (ref 80.0–100.0)
MPV: 11.9 fL (ref 9.4–12.3)
Monocytes Absolute Automated: 0.33 10*3/uL (ref 0.00–1.20)
Monocytes: 7.3 %
Neutrophils Absolute: 3.7 10*3/uL (ref 1.80–8.10)
Neutrophils: 81.5 %
Nucleated RBC: 0 /100 WBC (ref 0.0–1.0)
Platelets: 112 10*3/uL — ABNORMAL LOW (ref 140–400)
RBC: 4.34 10*6/uL (ref 4.20–5.40)
RDW: 12 % (ref 12–15)
WBC: 4.54 10*3/uL (ref 3.50–10.80)

## 2016-08-26 LAB — COMPREHENSIVE METABOLIC PANEL
ALT: 13 U/L (ref 0–55)
AST (SGOT): 27 U/L (ref 5–34)
Albumin/Globulin Ratio: 1.6 (ref 0.9–2.2)
Albumin: 4.3 g/dL (ref 3.5–5.0)
Alkaline Phosphatase: 69 U/L (ref 37–106)
Anion Gap: 9 (ref 5.0–15.0)
BUN: 20 mg/dL — ABNORMAL HIGH (ref 7.0–19.0)
Bilirubin, Total: 0.7 mg/dL (ref 0.2–1.2)
CO2: 28 mEq/L (ref 22–29)
Calcium: 8.9 mg/dL (ref 7.9–10.2)
Chloride: 98 mEq/L — ABNORMAL LOW (ref 100–111)
Creatinine: 0.8 mg/dL (ref 0.6–1.0)
Globulin: 2.7 g/dL (ref 2.0–3.6)
Glucose: 135 mg/dL — ABNORMAL HIGH (ref 70–100)
Potassium: 4.2 mEq/L (ref 3.5–5.1)
Protein, Total: 7 g/dL (ref 6.0–8.3)
Sodium: 135 mEq/L — ABNORMAL LOW (ref 136–145)

## 2016-08-26 LAB — LACTIC ACID, PLASMA: Lactic Acid: 1.1 mmol/L (ref 0.2–2.0)

## 2016-08-26 LAB — TROPONIN I: Troponin I: 0.01 ng/mL (ref 0.00–0.09)

## 2016-08-26 LAB — GFR: EGFR: >60

## 2016-08-26 MED ORDER — ACETAMINOPHEN 500 MG PO TABS
1000.0000 mg | ORAL_TABLET | Freq: Once | ORAL | Status: AC
Start: 2016-08-26 — End: 2016-08-26
  Administered 2016-08-26: 1000 mg via ORAL
  Filled 2016-08-26: qty 2

## 2016-08-26 MED ORDER — OSELTAMIVIR PHOSPHATE 30 MG PO CAPS
30.0000 mg | ORAL_CAPSULE | Freq: Two times a day (BID) | ORAL | 0 refills | Status: AC
Start: 2016-08-26 — End: 2016-08-31

## 2016-08-26 MED ORDER — OSELTAMIVIR PHOSPHATE 30 MG PO CAPS
30.0000 mg | ORAL_CAPSULE | Freq: Once | ORAL | Status: DC
Start: 2016-08-26 — End: 2016-08-26

## 2016-08-26 MED ORDER — OSELTAMIVIR PHOSPHATE 6 MG/ML PO SUSR
30.0000 mg | Freq: Once | ORAL | Status: AC
Start: 2016-08-26 — End: 2016-08-26
  Administered 2016-08-26: 30 mg via ORAL

## 2016-08-26 MED ORDER — OSELTAMIVIR PHOSPHATE 6 MG/ML PO SUSR
ORAL | Status: DC
Start: 2016-08-26 — End: 2016-08-27
  Filled 2016-08-26: qty 60

## 2016-08-26 NOTE — ED Notes (Signed)
Pt ambulatory without assistance with Dr. Lucianne Muss and myself.

## 2016-08-26 NOTE — Discharge Instructions (Signed)
Influenza, Seasonal     You have been seen for influenza. This is sometimes called “the flu.” Often, no tests are done but influenza is strongly suspected. For this reason, many doctors are giving the diagnosis of “Influenza-Like Illness.”     Influenza is very contagious. This means it spreads easily among people. It is caused by the influenza virus. That is why it is also called "the flu." It affects the respiratory tract. This includes the nose, throat and lungs. In some people, it can cause very serious problems. This usually happens with the very young and the elderly. The Centers for Disease Control and Prevention (CDC) reports that an average of 5-20% of people living in the [place] are affected by regular seasonal influenza every year. This includes influenza A and B. More than 200,000 people are hospitalized from flu complications. About 36,000 people die from flu-related causes every year.     STAY AWAY FROM OTHER PEOPLE WHO ARE NOT YET AFFECTED. THIS IS THE BEST WAY TO STOP THE SPREAD OF INFLUENZA. YOU CAN SPREAD THE FLU FROM ONE DAY BEFORE YOU GET SICK UP TO 7 DAYS AFTER YOU DEVELOP SYMPTOMS.     UNTIL IT IS CONFIRMED THAT YOU DON’T HAVE INFLUENZA, YOU SHOULD:  · Stay off from work. You should be off of work for 7 days from the start of symptoms or until the viral cultures show you don’t have influenza.  · Stay home and do not go into public places.  · Stay away from others in the house.  · Wash your hands often.  · Cover your mouth when you sneeze or cough. Use your sleeve to cover your mouth and nose. Do not use your hand.     People you live with and those you had close contact with, should watch for flu signs. People with a serious medical condition or who are pregnant should tell their doctor they were exposed to flu.     Influenza often happens as an epidemic in the fall and winter. This means many people get the flu virus at the same time. It spreads from person to person. Over the centuries, there  have been epidemics of many different “new” strains of influenza.     Most people with influenza get these symptoms:  · High fevers: Fevers are often higher than 102°F or 39°C.  · Cough. A dry, hacking cough is most common.  · Headache: The headache may be worse with the fever. It can be there even when the temperature is normal.  · Muscle aches. Many people have muscle pain. These are called “myalgias.”  · Sore throat.     You may also feel weak and fatigued (tired). You may also get a runny nose or congestion (stuffy nose). Some people will have mild stomach symptoms. This can include vomiting (throwing up). Sometimes it includes mild diarrhea.     Influenza does not primarily involve vomiting and diarrhea. Vomiting and diarrhea are usually symptoms of a completely different problem. This problem is called "gastroenteritis." Gastroenteritis is often called "the stomach flu." This is how the confusion came about! True influenza is a lung infection.     During flu season, influenza can sometimes be diagnosed without tests. This can be done if a patient has the right set of symptoms. These are called "classic flu" symptoms. Sometimes flu testing is done. This is usually with a test called a “Rapid Influenza Test.” Results show up in a few minutes. The test is not perfect. It misses many cases. Sometimes, more specialized tests   are done. These tests pick up more of the cases. However, results are not ready for a few days.     Usually, the "flu shot" is good at preventing influenza. Some years the shot is less effective than others. You can sometimes still get severe influenza, even if you had the immunization (shot). You can still get "the stomach flu," (not true influenza), even after the flu shot. Today, the current Influenza vaccine is designed to protect against seasonal Influenza A and B and H1N1. There are good supplies of the vaccine available. It is being given to all persons who would like to receive  it.     Influenza treatment depends on how early in the course of the illness you are when diagnosed. With medicine, you may feel better sooner. Medicines do not cure the flu. However, they do help your body fight the infection.     Some antiviral medicines may help symptoms. They often only help if they are started in the first few days of the illness. The Centers for Disease Control and Prevention (CDC) makes recommendations as to who should get the antiviral medicine. Your doctor will decide if the medicine may be helpful for you.     Most patients get better with simple things like rest, fluids and a little time. Acetaminophen (Tylenol®) and/or ibuprofen (Advil® or Motrin®) can also help. Older patients or those with serious medical problems may have worse problems with the flu. They may need additional care.     Treatment of influenza usually is at home. Some patients need to be in the hospital. These are often very young and very old patients and those with severe symptoms. It also includes those with other serious medical problems.     It is OK for you to go home now. Follow the instructions above about staying away from others!     YOU SHOULD SEEK MEDICAL ATTENTION IMMEDIATELY, EITHER HERE OR AT THE NEAREST EMERGENCY DEPARTMENT, IF ANY OF THE FOLLOWING OCCURS:  · You have shortness of breath, wheezing or any severe problems breathing.   · You have a headache that gets worse or a stiff neck.  · You feel lightheaded or faint.  · You have chest pain.  · You have a cough and fever (temperature higher than 100.4ºF or 38ºC) that seem to come back AFTER your flu symptoms get better. This can be a different kind of lung infection called “pneumonia,” that sometimes happens after the flu.  · You feel sicker at any time or do not get better as expected. You can expect to feel sick for a week or so.  · You have any other new symptoms or concerns.     If you can t follow up with your doctor, or if at any time you feel  you need to be rechecked or seen again, come back here or go to the nearest emergency department.

## 2016-08-26 NOTE — ED Notes (Signed)
Bed: M03  Expected date:   Expected time:   Means of arrival:   Comments:  Hold for me

## 2016-08-26 NOTE — ED Provider Notes (Signed)
EMERGENCY DEPARTMENT HISTORY AND PHYSICAL EXAM     Physician/Midlevel provider first contact with patient: 08/26/16 2120         Date: 08/26/2016  Patient Name: Michele [name]    History of Presenting Illness     Chief Complaint   Patient presents with   . Fever   . Altered Mental Status   . Cough       History Provided By: Pt and family    Chief Complaint: Fever  Duration: Three days  Timing: Persistent  Location: general  Quality: feels hot/cold  Severity: Moderate  Exacerbating Factors: None   Alleviating Factors: None  Associated Symptoms: Cough, sore throat, rhinorrhea, generalized weakness/fatigue  Pertinent Negatives: Headache, chest pain, SOB, abdominal pain, neck pain, neck stiffness, back pain, dysuria, polyuria, vomiting, diarrhea, blood thinner use    Additional History: Pearlee Arvizu is a 76 y.o. female presenting to the ED with a fever x three days with associated cough, sore throat, rhinorrhea, and generalized weakness. Pt alert and oriented x 4. She fell yesterday due to feeling weak and had trouble getting up. She states she has not had any trouble with fall since then and drove herself to the ED today. She was triaged as having "altered mental status" though she seems fully alert and oriented and is providing detailed, coherent history of her illness. She denies feeling confused.    PCP: Jeraldine Loots, MD    No current facility-administered medications for this encounter.      Current Outpatient Prescriptions   Medication Sig Dispense Refill   . albuterol (VENTOLIN HFA) 108 (90 Base) MCG/ACT inhaler Inhale 2 puffs into the lungs every 4 (four) hours as needed for Wheezing or Shortness of Breath (chest tightness). 1 Inhaler 5   . budesonide-formoterol (SYMBICORT) 80-4.5 MCG/ACT inhaler Inhale 2 puffs into the lungs 2 (two) times daily. 10.2 g 1   . Calcium Carbonate (CALCIUM 600 PO) Take 1 tablet by mouth daily.         . Cholecalciferol (VITAMIN D PO) Take 1 tablet by mouth daily.         Marland Kitchen  escitalopram (LEXAPRO) 5 MG tablet Take 1 tablet (5 mg total) by mouth daily. 30 tablet 1   . loratadine (CLARITIN) 10 MG tablet Take 1 tablet (10 mg total) by mouth daily. 30 tablet 0   . omeprazole (PRILOSEC) 20 MG capsule TAKE ONE CAPSULE BY MOUTH ONE TIME DAILY 90 capsule 0   . oseltamivir (TAMIFLU) 30 MG capsule Take 1 capsule (30 mg total) by mouth 2 (two) times daily.for 5 days 10 capsule 0   . sodium chloride (OCEAN NASAL SPRAY) 0.65 % nasal spray 1 spray by Nasal route as needed for Congestion. 30 mL 0       Past History     Past Medical History:  Past Medical History:   Diagnosis Date   . Abnormal EKG    . Arthralgia    . Barrett's esophagus    . Cervical spinal stenosis    . Colonic polyp    . Depression    . Disc disorder    . Gastroesophageal reflux disease    . Lower back pain    . Numbness and tingling of both legs 02/06/2015   . Osteoarthritis    . Osteopenia        Past Surgical History:  Past Surgical History:   Procedure Laterality Date   . APPENDECTOMY  1960?    when was a teenager   .  TUBAL LIGATION  1973       Family History:  Family History   Problem Relation Age of Onset   . Breast cancer Neg Hx    . Ovarian cancer Neg Hx    . Cancer Neg Hx        Social History:  Social History   Substance Use Topics   . Smoking status: Current Every Day Smoker     Packs/day: 0.50     Years: 50.00     Types: Cigarettes   . Smokeless tobacco: Never Used   . Alcohol use No       Allergies:  No Known Allergies    Review of Systems     Review of Systems   Constitutional: Positive for fatigue and fever.   HENT: Positive for rhinorrhea and sore throat.    Eyes: Negative for discharge, redness and visual disturbance.   Respiratory: Positive for cough. Negative for shortness of breath.    Cardiovascular: Negative for chest pain and leg swelling.   Gastrointestinal: Negative for abdominal pain and nausea.   Endocrine: Negative for polyuria.   Genitourinary: Negative for dysuria, flank pain, frequency and urgency.    Musculoskeletal: Negative for back pain, neck pain and neck stiffness.   Skin: Negative for rash.   Allergic/Immunologic: Negative for immunocompromised state.   Neurological: Negative for light-headedness and headaches.   Hematological: Does not bruise/bleed easily.   Psychiatric/Behavioral: Negative for suicidal ideas.       Physical Exam   BP 121/54   Pulse 83   Temp 100 F (37.8 C) (Oral)   Resp 18   Ht 5\' 4"  (1.626 m)   Wt 53.1 kg   SpO2 95%   BMI 20.08 kg/m     Constitutional: Vital signs reviewed. Well appearing. Looks younger than stated age. No distress.  Head: Normocephalic, atraumatic  Eyes: Conjunctiva and sclera are normal.  No injection or discharge.  Ears, Nose, Throat:  Normal external examination of the ears. (+) clear rhinorrhea. No throat or oropharyngeal swelling or erythema. Midline uvula. Mucous membranes moist.  Neck: Normal range of motion. No c-spine tenderness. Supple, no meningeal signs. Trachea midline. No stridor. No JVD  Respiratory/Chest: Clear to auscultation. Occasional cough. No respiratory distress.   Cardiovascular: Regular rate and rhythm. No murmurs.  Abdomen:  Bowel sounds intact. No rebound or guarding. Soft.  Non-tender.  Back: No CVA tenderness to percussion. No focal tenderness.  Upper Extremity:  No edema. No cyanosis. Bilateral radial pulses intact and equal.   Lower Extremity:  No edema. No cyanosis. Bilateral calves symmetrical and non-tender. Bilateral femoral, DP, PT pulses intact and equal.  Skin: Warm and dry. No rash.  Neuro: A&Ox3. CNII -XII intact to testing. Strength 5/5 and symmetrical in the bilateral upper and lower extremities. Sensation to sharp touch intact and equal in the bilateral upper and lower extremities. Coordination intact to finger to nose testing. Normal gait.   Psychiatric: Normal affect.  Normal insight.        Diagnostic Study Results     Labs -     Results     Procedure Component Value Units Date/Time    Troponin I [[id number]]  Collected:  08/26/16 2121    Specimen:  Blood Updated:  08/26/16 2159     Troponin I 0.01 ng/mL     Comprehensive metabolic panel [[id number]]  (Abnormal) Collected:  08/26/16 2121    Specimen:  Blood Updated:  08/26/16 2153     Glucose 135 (  H) mg/dL      BUN 38.7 (H) mg/dL      Creatinine 0.8 mg/dL      Sodium 564 (L) mEq/L      Potassium 4.2 mEq/L      Chloride 98 (L) mEq/L      CO2 28 mEq/L      Calcium 8.9 mg/dL      Protein, Total 7.0 g/dL      Albumin 4.3 g/dL      AST (SGOT) 27 U/L      ALT 13 U/L      Alkaline Phosphatase 69 U/L      Bilirubin, Total 0.7 mg/dL      Globulin 2.7 g/dL      Albumin/Globulin Ratio 1.6     Anion Gap 9.0    GFR [[id number]] Collected:  08/26/16 2121     Updated:  08/26/16 2153     EGFR >60.0    CBC and differential [[id number]]  (Abnormal) Collected:  08/26/16 2121    Specimen:  Blood from Blood Updated:  08/26/16 2153     WBC 4.54 x10 3/uL      Hgb 13.3 g/dL      Hematocrit 01.0 %      Platelets 112 (L) x10 3/uL      RBC 4.34 x10 6/uL      MCV 91.7 fL      MCH 30.6 pg      MCHC 33.4 g/dL      RDW 12 %      MPV 11.9 fL      Neutrophils 81.5 %      Lymphocytes Automated 10.6 %      Monocytes 7.3 %      Eosinophils Automated 0.0 %      Basophils Automated 0.4 %      Immature Granulocyte 0.2 %      Nucleated RBC 0.0 /100 WBC      Neutrophils Absolute 3.70 x10 3/uL      Abs Lymph Automated 0.48 (L) x10 3/uL      Abs Mono Automated 0.33 x10 3/uL      Abs Eos Automated 0.00 x10 3/uL      Absolute Baso Automated 0.02 x10 3/uL      Absolute Immature Granulocyte 0.01 x10 3/uL      Absolute NRBC 0.00 x10 3/uL     Lactic acid, plasma- sepsis initial specimen followed by a second specimen if initial result is greater than 2.0 mEq/L [[id number]] Collected:  08/26/16 2121    Specimen:  Blood Updated:  08/26/16 2147     Lactic acid 1.1 mmol/L     Narrative:       Cancel second specimen if the initial lactate level is less  than 2.0 mEq/L.    Influenza A/B Virus Antigen [[id number]] Collected:   08/26/16 2121    Specimen:  Nasopharyngeal from Nasal Aspirate Updated:  08/26/16 2143    Narrative:       ORDER#: [id number]                                    ORDERED BY: Enriqueta Shutter  SOURCE: Nasal Aspirate                               COLLECTED:  08/26/16 21:21  ANTIBIOTICS AT COLL.:  RECEIVED :  08/26/16 21:31  Influenza Rapid Antigen A&B                FINAL       08/26/16 21:43   +  08/26/16   Positive for Influenza A and Negative for Influenza B             Reference Range: Negative      Blood Culture #1 [[id number]] Collected:  08/26/16 2121    Specimen:  Blood, Intravenous Line Updated:  08/26/16 2121    Narrative:       Indications:->Sepsis  1 BLUE+1 PURPLE    Blood Culture #2 [[id number]] Collected:  08/26/16 2121    Specimen:  Blood, Intravenous Line Updated:  08/26/16 2121    Narrative:       Indications:->Sepsis  Indications:->Fever of greater than 101.5  1 BLUE+1 PURPLE    Lactic acid, plasma- sepsis initial specimen followed by a second specimen if initial result is greater than 2.0 mEq/L [[id number]] Collected:  08/26/16 2121    Specimen:  Blood Updated:  08/26/16 2121    Narrative:       Leretha Dykes second specimen if the initial lactate level is less  than 2.0 mEq/L.  No tourniquet;on ice          Radiologic Studies -   Radiology Results (24 Hour)     Procedure Component Value Units Date/Time    Chest AP Portable [[id number]] Collected:  08/26/16 2208    Order Status:  Completed Updated:  08/26/16 2212    Narrative:       History: sepsis screen positive    Technique: Single Portable View        Findings:  The lungs appear clear.  There is no pneumothorax.  The heart is normal in size.    The mediastinum is within normal limits.             Impression:        No active disease is seen in the chest.    Laurena Slimmer, MD   08/26/2016 10:08 PM    CT Head without Contrast [[id number]] Collected:  08/26/16 2148    Order Status:  Completed Updated:  08/26/16 2156    Narrative:       CT  HEAD WO CONTRAST    CLINICAL INDICATION:   HEAD TRAUMA, CLOSED, MOD-SEVERE fall     TECHNIQUE: Noncontrast serial axial images skull base to vertex with  coronal and sagittal reconstruction.  Note that CT scanning at this site utilizes multiple dose reduction  techniques including automatic exposure control, adjustment of the MAA  and/or KVP according to the patient's size and use of iterative  reconstruction technique.    COMPARISON: CT head 01/13/2006    FINDINGS:      There is no mass effect or midline shift. There are no  abnormal intra or extra-axial fluid collections. Cortical sulci and  lateral ventricles are within normal limits for size and configuration.  The perimesencephalic cistern is patent. No acute intracranial  hemorrhage is identified.  Mucosal thickening is seen in the right maxillary sinus and scattered  luminal air cells.  No acute osseous abnormality is identified.      Impression:           1.  No acute intracranial hemorrhage is identified.    Tana Felts, MD   08/26/2016 9:51 PM      .      Medical Decision Making  I am the first provider for this patient.    I reviewed the vital signs, available nursing notes, past medical history, past surgical history, family history and social history.    Vital Signs-Reviewed the patient's vital signs.     Patient Vitals for the past 12 hrs:   BP Temp Pulse Resp   08/26/16 2253 121/54 100 F (37.8 C) 83 18   08/26/16 2100 108/54 (!) 102.9 F (39.4 C) 97 20       Pulse Oximetry Analysis - Normal, 95% on RA    Medical records:  Previous records  Nursing notes  Previous EKG  Previous imaging  Previous labs        Cardiac Monitor:   Rate: 92   Rhythm: Normal Sinus Rhythm  EKG:  Interpreted by the Emergency Physician.   Time Interpreted: 2055   Rate: 90   Rhythm: Normal Sinus Rhythm   Interpretation: No ST elevation. T wave inversion V2, V3. Nonspecific ST abnormalities to anterior lateral leads   Comparison: Compared to EKG 02/29/2016- no longer sinus  brady. New T wave inversions V2, V3 and nonspecific ST abnormality    ED Course:     9:24 PM - Pt agreeable to ED plan, including having lab work performed.    10:56 PM - Pt resting comfortably. Tolerating PO. Neuro exam stable. She is asking to go home. Pt able to ambulate around the ED without difficulty or assistance and feels much improved. Discussed all results with pt and daughter and counseled on diagnosis, f/u plans, medication use, and signs and symptoms when to return to ED. Daughter is comfortable with patient going home with close follow up.    Pt voices understanding and agreement with plan. All questions and concerns addressed.         Provider Notes: Pt with acute influenza A infection. Non-toxic appearing. Felt better after tylenol and IV fluids. Started on tamiflu. Ambulating without difficulty. Reviewed supportive care. Will f/u closely with PMD and ED as needed.     Diagnosis     Clinical Impression:   1. Influenza A        Treatment Plan:   ED Disposition     ED Disposition Condition Date/Time Comment    Discharge  Tue Aug 26, 2016 11:03 PM Toma Deiters discharge to home/self care.    Condition at disposition: Stable        _______________________________    Attestations:   This note is prepared by Marcille Blanco, acting as Scribe for Lynnea Ferrier, MD.    Lynnea Ferrier, MD:  The scribe's documentation has been prepared under my direction and personally reviewed by me in its entirety.  I confirm that the note above accurately reflects all work, treatment, procedures, and medical decision making performed by me.    _______________________________             Maryella Shivers, MD  08/28/16 [phone]

## 2016-08-27 ENCOUNTER — Ambulatory Visit (INDEPENDENT_AMBULATORY_CARE_PROVIDER_SITE_OTHER): Payer: Medicare Other | Admitting: Family Medicine

## 2016-08-27 LAB — ECG 12-LEAD
Atrial Rate: 90 {beats}/min
P Axis: 69 degrees
P-R Interval: 148 ms
Q-T Interval: 330 ms
QRS Duration: 74 ms
QTC Calculation (Bezet): 403 ms
R Axis: 61 degrees
T Axis: 31 degrees
Ventricular Rate: 90 {beats}/min

## 2016-08-28 ENCOUNTER — Telehealth: Payer: Self-pay | Admitting: Emergency Medicine

## 2016-09-01 ENCOUNTER — Ambulatory Visit (INDEPENDENT_AMBULATORY_CARE_PROVIDER_SITE_OTHER): Payer: Medicare Other | Admitting: Internal Medicine

## 2016-09-01 VITALS — BP 109/68 | HR 69 | Temp 98.1°F | Resp 17 | Wt 110.6 lb

## 2016-09-01 DIAGNOSIS — K2270 Barrett's esophagus without dysplasia: Secondary | ICD-10-CM

## 2016-09-01 DIAGNOSIS — F419 Anxiety disorder, unspecified: Secondary | ICD-10-CM

## 2016-09-01 DIAGNOSIS — J111 Influenza due to unidentified influenza virus with other respiratory manifestations: Secondary | ICD-10-CM

## 2016-09-01 DIAGNOSIS — Z9181 History of falling: Secondary | ICD-10-CM

## 2016-09-01 MED ORDER — OMEPRAZOLE 20 MG PO CPDR
DELAYED_RELEASE_CAPSULE | ORAL | 0 refills | Status: DC
Start: 2016-09-01 — End: 2017-01-07

## 2016-09-01 MED ORDER — ESCITALOPRAM OXALATE 5 MG PO TABS
5.0000 mg | ORAL_TABLET | Freq: Every day | ORAL | 1 refills | Status: DC
Start: 2016-09-01 — End: 2017-08-20

## 2016-09-01 NOTE — Progress Notes (Signed)
Nursing Documentation:  Limb alert status: Patient asked and denied any limb restrictions for blood pressure/blood draws.  Has the patient seen any other providers since their last visit: no  The patient is due for spirometry and MAW

## 2016-09-01 NOTE — Progress Notes (Signed)
Blood Cultures 1 and 2 negative, no further action needed.

## 2016-09-02 ENCOUNTER — Encounter (INDEPENDENT_AMBULATORY_CARE_PROVIDER_SITE_OTHER): Payer: Self-pay | Admitting: Internal Medicine

## 2016-09-02 NOTE — Progress Notes (Signed)
Subjective:       Patient ID: Michele [name] is a 76 y.o. female.  Chief Complaint   Patient presents with   . Fall     08/26/2016 patient was confused after fall       HPI    Influenza; seen I ER On 08/26/16 for generalized weakness and URI symptoms. . Dx with influenza A. Started on tamiflu. ER records reviewed through [facility] .  Reports she had a fall the day before ER visit . Did not lose consciousness. No head injury. Was feeling weak. CT head and CXR done were negative  Today reports sx improving gradually. Still feels tired. Denies any dizziness/ SOB/ syncope. No confusion or focal weakness.   Tolerating PO well.     Anxiety; stable. On lexapro 5mg  once daily. Tolerating well   Sx stable with medication    Problem   Barrett Esophagus    Stable. On prilosec 20mg  once daily.   Follow up by Dr Gwenlyn Perking             The following portions of the patient's history were reviewed and updated as appropriate: allergies, current medications, past family history, past medical history, past social history, past surgical history and problem list.    Review of Systems   Constitutional: Positive for fatigue. Negative for chills and fever.   HENT: Positive for rhinorrhea. Negative for postnasal drip.    Eyes: Negative for visual disturbance.   Respiratory: Positive for cough. Negative for shortness of breath.    Cardiovascular: Negative for chest pain.   Gastrointestinal: Negative for diarrhea and vomiting.   Genitourinary: Negative for dysuria.   Musculoskeletal: Positive for arthralgias. Negative for gait problem.   Skin: Negative for rash.   Neurological: Negative for dizziness, light-headedness and headaches.           Objective:    Physical Exam   Constitutional: She is oriented to person, place, and time. She appears well-developed and well-nourished.   Cardiovascular: Normal rate, regular rhythm and normal heart sounds.    Pulmonary/Chest: Breath sounds normal. No respiratory distress. She has no wheezes. She has no rales.    Musculoskeletal: Normal range of motion.   Neurological: She is alert and oriented to person, place, and time.   Psychiatric: She has a normal mood and affect.   Nursing note and vitals reviewed.  Blood pressure 109/68, pulse 69, temperature 98.1 F (36.7 C), temperature source Oral, resp. rate 17, weight 50.2 kg (110 lb 9.6 oz).        Assessment:       1. Influenza     2. History of fall     3. Anxiety  escitalopram (LEXAPRO) 5 MG tablet   4. Barrett's esophagus without dysplasia  omeprazole (PRILOSEC) 20 MG capsule           Plan:       1. Influenza  Improving gradually.   Complete tamiflu  Rest and good hydration    2. History of fall  Secondary to  #1   No LOC or head injury  Normal CT head at ER    3. Anxiety  Stable. Continue lexapro 5mg  once daily.   - escitalopram (LEXAPRO) 5 MG tablet; Take 1 tablet (5 mg total) by mouth daily.  Dispense: 30 tablet; Refill: 1    4. Barrett's esophagus without dysplasia  Stable. Continue prilosec 20mg  once daily.   - omeprazole (PRILOSEC) 20 MG capsule; TAKE ONE CAPSULE BY MOUTH ONE TIME DAILY  Dispense: 90 capsule; Refill: 0    F/u in 2 months or earlier prn     Procedures

## 2016-09-03 ENCOUNTER — Ambulatory Visit (INDEPENDENT_AMBULATORY_CARE_PROVIDER_SITE_OTHER): Payer: Medicare Other | Admitting: Internal Medicine

## 2016-09-12 ENCOUNTER — Ambulatory Visit (INDEPENDENT_AMBULATORY_CARE_PROVIDER_SITE_OTHER): Payer: Medicare Other | Admitting: Neurology

## 2016-09-12 ENCOUNTER — Encounter (INDEPENDENT_AMBULATORY_CARE_PROVIDER_SITE_OTHER): Payer: Self-pay | Admitting: Neurology

## 2016-09-12 VITALS — BP 99/60 | HR 78 | Ht 60.0 in | Wt 110.0 lb

## 2016-09-12 DIAGNOSIS — R292 Abnormal reflex: Secondary | ICD-10-CM

## 2016-09-12 DIAGNOSIS — R251 Tremor, unspecified: Secondary | ICD-10-CM

## 2016-09-12 NOTE — Progress Notes (Signed)
[email or web address]    Subjective:      76 y/o R handed F with hx osteopenia, tobacco use, presents to Movement Disorders clinic for evaluation of tremors mainly in her legs when standing (L>R).    She noticed the tremors around 3 years ago.  Started 'slow' but are getting worse.  She would feel she was nervous and her head was shaking.  Now, she can't stand or move without her whole body shaking vigorously, mainly in the legs. Makes her balance off. Legs start shaking when she stands up. When she is lying in bed, she can feel her body shaking. It shakes utensils, has to be careful with soup. She has felt her handwriting has changed, it is shaking. She also feels muscle cramps in her hands and legs. No reduction in dexterity.     Does not typically happen when sitting and improves when walking around.  However, if she is standing still, especially if she puts pressure on her legs, her left leg will start to shake in particular and sometimes it will lead to her whole body shaking.  She feels that it is much worse in her left leg as compared to her right leg.  She denies any specific numbness. She denies any specific weakness.  No falls and feels like she is walking okay.  The shaking again seems to mainly be when she is standing still.  She has a long history of back pain with burning and tingling down into both legs.  She has no shooting pain down the legs.  No specific neck pain, but does feel a bit stiff.  No shooting pain down the arms.  No saddle anesthesia.  No loss of bowel or bladder control.  No specific rigidity in arms or legs.  Does not get dizzy when standing.  No specific change in gait.  No changes in face.    No change to sense of smell, no constipation.  Sleep is good, not fragmented. She does feel burning and tingles in her legs; both legs. Can be painful. + fatigue. No falls. Had the flu last week, which made her especially weak. + occasional LH/D on standing.     No changes  to medication or illnesses 3 yrs ago. Unclear pulmonary history, has not used inhalers in years.  Used it once last week d/t influenza A.     + heavy smoker, no EtOH use. + sister with tremors, who is younger than her. No other family hx noted.     The following portions of the patient's history were reviewed and updated as appropriate: allergies, past family history, past medical history, past social history, past surgical history and problem list.    Review of Systems  + leg tremors  No recent illness. Denies fever, chills, cough, sinus pain, eye pain, eye redness, ear pain, rhinorrhea, sore throat, chest pain, SOB, wheezing, abd pain, Nausea, Vomiting, diarrhea, constipation, dysuria, or rashes.  All other systems reviewed and are negative except as previously noted in the HPI.    Objective:       Vitals:    09/12/16 1013   BP: 99/60   Pulse: 78     Constitutional: NAD   Eyes: Clear conjunctiva  Cardiovascular: RRR  Respiratory: CTA B   Musculoskeletal: Normal posture and muscle bulk.  Integumentary: No abnormal rash noted  Psych: Normal affect    Neurological:   Mental Status: Alert & oriented to person, place, month, & year.   Language: Spontaneous &  fluent speech, good comprehension.    Cranial Nerves:  II, III: PERRL  III, IV, VI: EOMI, No nystagmus, no palsies, no ptosis  V: Intact to LT V1-V3 distribution bilaterally.   VI: Symmetrical face and expression.   VIII: Hearing intact to finger rub bilaterally.   IX, X: Palate/Uvula elevates symmetrically.   XI: 5/5 Trapezius & SCM bilaterally.   XII: Tongue is midline.     Motor Exam: Normal bulk and tone. No clonus. No pronator drift. Strength 5/5 throughout and symmetric. ? Mild weakness in grip strength, slight intrinsic muscle atrophy in hands.    Sensation: Sensation to LT intact grossly through symmetrically. Romberg negative    Cerebellar:   Finger to Nose: intact bilaterally, with slight terminal dysmetria bilaterally.     Reflexes: 3+ throughout,  symmetric, with ? down-going toes. + Hoffman bilaterally.  No clonus.     Station/ Gait: Well balanced and stable. Normal foot width. Normal arm swing. No tremor with walking.    When standing with pressure on the right leg she has a high-amplitude shaking in the left leg that can affect her whole body.  When the leg is relaxed or she is standing on her right leg, it is almost still.  Nonoticeable tremor when standing still with both legs more relaxed.  No notable tremor in arms.  Some increased tone in the legs with a slight spastic catch.  Did not have any obvious clonus in her ankles.    Assessment:     76 y/o R handed F with hx osteopenia, tobacco use, presents to Movement Disorders clinic for evaluation of tremors mainly in her legs when standing (L>R). Her exam when sitting is rather nonfocal outside of Global hyperreflexia, positive Hoffmann sign in both hands, as well as possibly upgoing toes.  She does not have much for tremors at rest, but when standing or weightbearing, especially on her left leg, an almost clonus-like movement is observed.  This mitigates itself with walking, and also goes away with standing on the right leg.    The differential for this progressive movement disorder is broad, the most likely origin would be spinocerebellar, possibly heightened clonus response with position, especially noting the other upper motor neuron findings.  Orthostatic tremor is also on the differential, but rare.    Our plan will be to start with MRI brain and MRI cervical spine, as well as expanded blood panel including paraneoplastic testing, given history of heavy smoking.  Since she is hesitant to start medication, we will reconvene in 4-6 weeks to review these results, hopefully determine the cause and then consider medication.     Plan:     As above.     RTC in 4-6 weeks. Patient can follow up sooner if needed. In the meantime, patient will contact the office with any questions or concerns.          -----------------------------------------------------------    Additional notes and data scanned including patient questionnaire which may contain pertinent information to visit.     More than 50% of this 45 min office visit was spent counseling the patient on:   Pt's specific disease state including discussion about the medical condition, diagnostic and treatment options, as well as the plan as above.  We also discussed medication options, what pt is taking, indications and side effects. Finally we discussed specific lifestyle issues related to pt's condition including need for exercise and other lifestyle modifications.      Braley Luckenbaugh "Johnston Ebbs, MD  Co-Director  Movement Disorders Specialist  [facility]  [facility]    [address]  [address]  [place] [address]    [place], [place] [address]  T [phone]  F [phone]   T [phone]  F [phone]     [email or web address]

## 2016-09-12 NOTE — Patient Instructions (Signed)
[email or web address]    Our plan:     1) Please schedule and complete an MRI of the brain and your neck.     2) Please also complete the blood work as Barrister's clerk.    Come on back next month and we'll see what we are dealing with.     Today's Visit:      In today's visit I reviewed your medications and records relating your health - prior testing, blood work, reports of other health care providers present in your electronic medical record.     If you have records from non-[facility] doctors please send to [place] or bring to next office visit.     A copy of today's visit will be sent to your referring doctor and/or primary care doctor.    Let me know if there are things we could have done better for your office visit.    Patient satisfaction survey:      If you receive a patient satisfaction survey, I would greatly appreciate it if you would complete it.     We are like a car dealer where we aim for a score of 9 or 10.  If you had an experience that was less than a 9 or 10, please let me know so we can improve. If you had a good experience today, please let us know too.  We value your feedback.     Contact me online:      Patient Portal online - Please sign up for MyChart -- this is the best way to communicate me.  There is a messaging feature which can send messages directly to my inbox.  It is the best way to communicate with me and get test results, medication refills or ask questions.     You can expect to get a response within 24-48 hours during weekdays - if you do not, resend your request and inform us we did not respond. My goal is for every question answered as soon as possible. Average response for a phone call is 3 days due to the volume we receive (which is why MyChart is preferred).    MyChart should not be used if you are having a medical emergency -- call 911 in that case.     Coupons for medication:      If you would like to see if samples or vouchers available for your medicines please  consider checking online.  Most medicines have web sites where you can print coupons or vouchers for you co-pay.     For example: AutomobileBuzz.is,  DSLRemote.se, Namendaxr.com, http://www.walker-hill.info/, Rytary.com, Vimpat.com    Thank you for trusting me with your health.      Take care,      Mats Jeanlouis "Johnston Ebbs, MD  Co-Director  Movement Disorders Specialist  [facility]  [facility]    [email or web address]      [address]  [address]  [place] [address]    [place], [place] [address]  T [phone]  F [phone]   T [phone]  F [phone]

## 2016-09-22 ENCOUNTER — Ambulatory Visit: Payer: Medicare Other

## 2016-09-23 ENCOUNTER — Ambulatory Visit: Payer: Medicare Other

## 2016-09-24 ENCOUNTER — Ambulatory Visit (INDEPENDENT_AMBULATORY_CARE_PROVIDER_SITE_OTHER): Payer: Medicare Other | Admitting: Internal Medicine

## 2016-09-30 LAB — C-REACTIVE PROTEIN: C-Reactive Protein: 0.6 mg/L (ref 0.0–4.9)

## 2016-09-30 LAB — CBC WITH DIFF AND PLT, TSH, RPR, B12 PANEL
Baso(Absolute): 0 10*3/uL (ref 0.0–0.2)
Basos: 1 %
Eos: 2 %
Eosinophils Absolute: 0.1 10*3/uL (ref 0.0–0.4)
Hematocrit: 36.7 % (ref 34.0–46.6)
Hemoglobin: 12.5 g/dL (ref 11.1–15.9)
Immature Granulocytes Absolute: 0 10*3/uL (ref 0.0–0.1)
Immature Granulocytes: 0 %
Lymphocytes Absolute: 1.1 10*3/uL (ref 0.7–3.1)
Lymphocytes: 31 %
MCH: 30.3 pg (ref 26.6–33.0)
MCHC: 34.1 g/dL (ref 31.5–35.7)
MCV: 89 fL (ref 79–97)
Monocytes Absolute: 0.3 10*3/uL (ref 0.1–0.9)
Monocytes: 9 %
Neutrophils Absolute: 2.1 10*3/uL (ref 1.4–7.0)
Neutrophils: 57 %
Platelets: 162 10*3/uL (ref 150–379)
RBC: 4.12 x10E6/uL (ref 3.77–5.28)
RDW: 14.3 % (ref 12.3–15.4)
RPR: NONREACTIVE
TSH: 3.33 u[IU]/mL (ref 0.450–4.500)
Vitamin B-12: 533 pg/mL (ref 232–1245)
WBC: 3.6 10*3/uL (ref 3.4–10.8)

## 2016-09-30 LAB — COMPREHENSIVE METABOLIC PANEL
ALT: 12 IU/L (ref 0–32)
AST (SGOT): 19 IU/L (ref 0–40)
Albumin/Globulin Ratio: 1.8 (ref 1.2–2.2)
Albumin: 4.4 g/dL (ref 3.5–4.8)
Alkaline Phosphatase: 67 IU/L (ref 39–117)
BUN / Creatinine Ratio: 25 (ref 12–28)
BUN: 16 mg/dL (ref 8–27)
Bilirubin, Total: 0.5 mg/dL (ref 0.0–1.2)
CO2: 29 mmol/L (ref 18–29)
Calcium: 9.3 mg/dL (ref 8.7–10.3)
Chloride: 103 mmol/L (ref 96–106)
Creatinine: 0.63 mg/dL (ref 0.57–1.00)
EGFR: 100 mL/min/{1.73_m2} (ref >59)
EGFR: 87 mL/min/{1.73_m2} (ref >59)
Globulin, Total: 2.4 g/dL (ref 1.5–4.5)
Glucose: 97 mg/dL (ref 65–99)
Potassium: 4.3 mmol/L (ref 3.5–5.2)
Protein, Total: 6.8 g/dL (ref 6.0–8.5)
Sodium: 146 mmol/L — ABNORMAL HIGH (ref 134–144)

## 2016-09-30 LAB — RHEUMATOID FACTOR: RA Latex Turbid.: <10 IU/mL (ref 0.0–13.9)

## 2016-09-30 LAB — ANTISTREPTOLYSIN O TITER: ASO: 32 IU/mL (ref 0.0–200.0)

## 2016-09-30 LAB — SEDIMENTATION RATE: Sed Rate: 11 mm/hr (ref 0–40)

## 2016-09-30 LAB — T4, FREE: T4, Free: 1.1 ng/dL (ref 0.82–1.77)

## 2016-09-30 LAB — T3, FREE: T3, Free: 3.1 pg/mL (ref 2.0–4.4)

## 2016-10-01 LAB — ENA+DNA/DS+SJORGEN'S REFLEX
Anti-DNA (DS) Antibody Quantitative: 1 IU/mL (ref 0–9)
RNP Antibodies: 1.1 AI — ABNORMAL HIGH (ref 0.0–0.9)
Sjogren's Antibody (SS-A): <0.2 AI (ref 0.0–0.9)
Sjogren's Antibody (SS-B): <0.2 AI (ref 0.0–0.9)
Smith Antibodies: <0.2 AI (ref 0.0–0.9)

## 2016-10-01 LAB — LYME AB, TOTAL,REFLEX TO WESTERN BLOT (IGG & IGM)
Lyme Disease Ab, Quant, IgM: <0.8 index (ref 0.00–0.79)
Lyme IgG/IgM Ab: <0.91 {ISR} (ref 0.00–0.90)

## 2016-10-01 LAB — CELIAC DISEASE ANTIBODY PANEL
Deamidated Gliadin Abs, IgA: 3 units (ref 0–19)
Immunoglobulin A: 272 mg/dL (ref 64–422)
Transglutaminase IgA: <2 U/mL (ref 0–3)

## 2016-10-01 LAB — ANA SCREEN REFLEX: ANA Direct: POSITIVE — AB

## 2016-10-02 ENCOUNTER — Ambulatory Visit: Payer: Medicare Other

## 2016-10-02 LAB — PARANEOPLASTIC PROFILE II (ANTI-HU AND ANTI-RI)
Anti-Hu Antibodies: <1:10 {titer}
Anti-Ri Antibodies: <1:10 {titer}

## 2016-10-08 NOTE — Progress Notes (Signed)
Name: Michele [name] Age: 76 y.o.   Referring Physician: Brigid Re,*   Date of Injury: No data was found  Date Care Plan Established/Reviewed: No data was found  Date Treatment Started: No data was found  Visit Count: Visit count could not be calculated. Make sure you are using a visit which is associated with an episode.   Diagnosis:   1. Muscle weakness    2. Generalized weakness                                   Discontinuation of Therapy Services    Hanadi Ayala did not complete prescribed physical therapy visits.      Status is unknown at this time, physical therapy has been discontinued and patient has been discharged from care.  The last therapy note is below for review.    Please feel free to contact me with any questions regarding the care of Michele [name].    Sincerely,    Roosvelt Harps, DPT ZO#[id number]         10/08/2016            Algis Greenhouse, PT

## 2016-10-08 NOTE — PT/OT Therapy Note (Signed)
Name: Michele [name] Age: 76 y.o.   Referring Physician: Brigid Re,*   Date of Injury: 06/02/2016  Date Care Plan Established/Reviewed: 06/16/2016  Date Treatment Started: 06/16/2016  Visit Count: 11   Diagnosis:   1. Muscle weakness    2. Generalized weakness      Discontinuation of Therapy Services    Michele [name] did not complete prescribed physical therapy visits.      Status is unknown at this time, physical therapy has been discontinued and patient has been discharged from care.  The last therapy note is below for review.    Please feel free to contact me with any questions regarding the care of [facility].    Sincerely,    Roosvelt Harps, DPT ZH#[id number]         10/08/2016                                      Algis Greenhouse, PT

## 2016-10-13 ENCOUNTER — Ambulatory Visit: Payer: Medicare Other

## 2016-10-22 ENCOUNTER — Ambulatory Visit
Admission: RE | Admit: 2016-10-22 | Discharge: 2016-10-22 | Disposition: A | Discharge status: Home / Self Care | Payer: Medicare Other | Source: Ambulatory Visit | Attending: Neurology | Admitting: Neurology

## 2016-10-22 DIAGNOSIS — R292 Abnormal reflex: Secondary | ICD-10-CM | POA: Insufficient documentation

## 2016-10-22 DIAGNOSIS — R251 Tremor, unspecified: Secondary | ICD-10-CM | POA: Insufficient documentation

## 2016-10-22 DIAGNOSIS — G319 Degenerative disease of nervous system, unspecified: Secondary | ICD-10-CM | POA: Insufficient documentation

## 2016-10-22 DIAGNOSIS — M47812 Spondylosis without myelopathy or radiculopathy, cervical region: Secondary | ICD-10-CM | POA: Insufficient documentation

## 2016-10-22 DIAGNOSIS — M4802 Spinal stenosis, cervical region: Secondary | ICD-10-CM | POA: Insufficient documentation

## 2016-10-22 MED ORDER — GADOBUTROL 1 MMOL/ML IV SOLN
5.0000 mL | Freq: Once | INTRAVENOUS | Status: AC | PRN
Start: 2016-10-22 — End: 2016-10-22
  Administered 2016-10-22: 17:00:00 5 mmol via INTRAVENOUS

## 2016-10-24 ENCOUNTER — Telehealth (INDEPENDENT_AMBULATORY_CARE_PROVIDER_SITE_OTHER): Payer: Self-pay

## 2016-10-24 NOTE — Telephone Encounter (Signed)
Is the patient taking current medications?Yes  Has there been any changes to current medication list?no change    Document any barriers/adverse reactions to current medicationsnone  CURRENT BARRIERS TO TAKING MEDICATION:none    Has the patient sought any care outside of the [facility]?No

## 2016-10-29 ENCOUNTER — Encounter (INDEPENDENT_AMBULATORY_CARE_PROVIDER_SITE_OTHER): Payer: Self-pay | Admitting: Internal Medicine

## 2016-10-29 ENCOUNTER — Ambulatory Visit
Admission: RE | Admit: 2016-10-29 | Discharge: 2016-10-29 | Disposition: A | Discharge status: Home / Self Care | Payer: Medicare Other | Source: Ambulatory Visit | Attending: Internal Medicine | Admitting: Internal Medicine

## 2016-10-29 ENCOUNTER — Ambulatory Visit (INDEPENDENT_AMBULATORY_CARE_PROVIDER_SITE_OTHER): Payer: Medicare Other | Admitting: Internal Medicine

## 2016-10-29 VITALS — BP 110/67 | HR 73 | Temp 98.2°F | Resp 17 | Ht 59.25 in | Wt 111.0 lb

## 2016-10-29 DIAGNOSIS — Z9181 History of falling: Secondary | ICD-10-CM

## 2016-10-29 DIAGNOSIS — Z0000 Encounter for general adult medical examination without abnormal findings: Secondary | ICD-10-CM

## 2016-10-29 DIAGNOSIS — R0781 Pleurodynia: Secondary | ICD-10-CM | POA: Insufficient documentation

## 2016-10-29 NOTE — Progress Notes (Signed)
Nursing Documentation:  Limb alert status: Patient asked and denied any limb restrictions for blood pressure/blood draws.  Has the patient seen any other providers since their last visit: no  The patient is due for spirometry

## 2016-10-29 NOTE — Progress Notes (Signed)
Michele [name] is a 76 y.o. female who presents today for a Medicare Annual Wellness Visit.     Health Risk Assessment     During the past month, how would you rate your general health?:  Poor  Which of the following tasks can you do without assistance - drive or take the bus alone; shop for groceries or clothes; prepare your own meals; do your own housework/laundry; handle your own finances/pay bills; eat, bathe or get around your home?:  Prepare your own meals  Which of the following problems have you been bothered by in the past month - dizzy when standing up; problems using the phone; feeling tired or fatigued; moderate or severe body pain?: Feeling tired or fatigued, Moderate or severe body pain  Do you exercise for about 20 minutes 3 or more days per week?:  No  During the past month was someone available to help if you needed and wanted help?  For example, if you felt nervous, lonely, got sick and had to stay in bed, needed someone to talk to, needed help with daily chores or needed help just taking care of yourself.: Yes  Do you always wear a seat belt?: Yes  Do you have any trouble taking medications the way you have been told to take them?: No  Have you been given any information that can help you with keeping track of your medications?: No  Do you have trouble paying for your medications?: No  Have you been given any information that can help you with hazards in your house, such as scatter rugs, furniture, etc?: Yes  Do you feel unsteady when standing or walking?: Yes  Do you worry about falling?: Yes  Have you fallen two or more times in the past year?: Yes  Did you suffer any injuries from your falls in the past year?: No    Additional Concerns:   c/o pain on the left lower chest wall. Reports chronic intermittent. No radiation. Increased on palpation.   12/17- mammo- normal.   Denies any difficulty in breathing    Patient Care Team:  Jeraldine Loots, MD as PCP - General (Family Medicine)  Rosealee Albee, MD as Consulting Physician (Cardiology)  Janice Norrie, MD as Consulting Physician (Gastroenterology)  Laurie Panda, MD as Consulting Physician (Neurology)    Past Medical History:   Diagnosis Date   . Abnormal EKG    . Arthralgia    . Barrett's esophagus    . Cervical spinal stenosis    . Colonic polyp    . Depression    . Disc disorder    . Gastroesophageal reflux disease    . Lower back pain    . Numbness and tingling of both legs 02/06/2015   . Osteoarthritis    . Osteopenia      Past Surgical History:   Procedure Laterality Date   . APPENDECTOMY  1960?    when was a teenager   . TUBAL LIGATION  1973     No Known Allergies   Current Outpatient Prescriptions   Medication Sig Dispense Refill   . albuterol (VENTOLIN HFA) 108 (90 Base) MCG/ACT inhaler Inhale 2 puffs into the lungs every 4 (four) hours as needed for Wheezing or Shortness of Breath (chest tightness). 1 Inhaler 5   . budesonide-formoterol (SYMBICORT) 80-4.5 MCG/ACT inhaler Inhale 2 puffs into the lungs 2 (two) times daily. 10.2 g 1   . Calcium Carbonate (CALCIUM 600 PO) Take 1 tablet by mouth  daily.         . omeprazole (PRILOSEC) 20 MG capsule TAKE ONE CAPSULE BY MOUTH ONE TIME DAILY 90 capsule 0   . escitalopram (LEXAPRO) 5 MG tablet Take 1 tablet (5 mg total) by mouth daily. 30 tablet 1   . loratadine (CLARITIN) 10 MG tablet Take 1 tablet (10 mg total) by mouth daily. 30 tablet 0   . sodium chloride (OCEAN NASAL SPRAY) 0.65 % nasal spray 1 spray by Nasal route as needed for Congestion. 30 mL 0     No current facility-administered medications for this visit.       Social History   Substance Use Topics   . Smoking status: Current Every Day Smoker     Packs/day: 0.50     Years: 50.00     Types: Cigarettes   . Smokeless tobacco: Never Used   . Alcohol use No      Family History   Problem Relation Age of Onset   . Breast cancer Neg Hx    . Ovarian cancer Neg Hx    . Cancer Neg Hx         Hospitalizations  no hospitalizations within  past year  ER visit within 6 months for influenza  Depression Screening- negative today     See related Activity or Flowsheet    Functional Ability    Falls Risk:  -   h/o fall while she was sick with influenza. 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 Follows with neurology currently.     Hearing:  hearing within normal limits  Exercise:  Light ( i.e. stretching or slow walking ) and no exercise  ADL's:   Bathing - independent   Dressing - independent   Mobility - uses cane   Transfer - independent   Eating - independent}   Toileting - independent   ADL assistance not needed    Discussion of Advance Directives: Has no Advanced Directive. Form provided.     Assessment    BP 110/67 (BP Site: Left arm, Patient Position: Sitting)   Pulse 73   Temp 98.2 F (36.8 C) (Oral)   Resp 17   Ht 1.505 m (4' 11.25")   Wt 50.3 kg (111 lb)   BMI 22.23 kg/m      Vision Screening (required for IPPE only): Patient states eye exam performed elsewhere within past 12 months  Screening EKG (IPPE only): not indicated    Phy;   Constitutional: She is oriented to person, place, and time.   HENT:   Head: Normocephalic and atraumatic.   Mouth/Throat: No oropharyngeal exudate.   Eyes: Conjunctivae normal are normal. Pupils are equal, round, and reactive to light.    Neck: Normal range of motion. Neck supple. No thyromegaly present.   Cardiovascular: Normal rate and regular rhythm. No murmur heard.   Pulmonary/Chest: Effort normal and Clear breath sounds on both sides. . Tenderness on the left lower ribs +  Abdominal: Soft, not distended, not tender, Bowel sounds are normal.   Musculoskeletal: Normal range of motion.   Neurological: She is alert and oriented to person, place, and time.   Psych; normal mood and affect    Evaluation of Cognitive Function    Mood/affect: Appropriate  Appearance: neatly groomed, appropriately and adequately nourished  Family member/caregiver input: Not present    Mini-Cog    Step 1: Three word registration  Version  1: Banana; Sunrise; Chair  Version 2: Leader; Season; Table    Step 2: Clock  drawing    Step 3: Three word recall    Scoring:  - word recall: 0-3 points (1 point for each)  - clock draw: 0 or 2 points (normal clock = 2 points)  Total: 0-5 points (< 4 points may indicate need for further evaluation)    Result:  > 3 points - negative screen for dementia    Health Maintenance   Topic Date Due   . Spirometry Annual  1940/07/16   . Pneumonia Vaccine Age 72+ (2 of 2 - PPSV23) 11/25/2016   . INFLUENZA VACCINE  03/07/2017   . FALLS RISK ANNUAL  10/29/2017   . DEPRESSION SCREENING  10/29/2017   . Medicare Annual Wellness Visit  10/29/2017   . DXA Scan  Completed   . SHINGLES IMMUNIZATION AGE 3+  Addressed     Assessment/Plan    1. Medicare annual wellness visit, subsequent  - UTD on preventive tests  - PCV 23 due in 11/2016  - counseled on smoking cessation. Not ready to quit.     2. Rib pain on left side  - XR Ribs Left W PA Chest; Future    3. History of fall  - fall precautions reviewed with the patient  - f/u with neurology as scheduled      Jeraldine Loots, MD          Preventive Service (Medicare Frequency)   USPSTF Frequency   ICD-10     Body Mass Index     Annually The USPSTF recommends screening all adults for obesity. Clinicians should offer or refer patients with a body mass index of 30 kg/m2 or higher to intensive, multicomponent behavioral interventions.      Blood Pressure:   Annually The USPSTF recommends screening for high blood pressure in adults aged 18 years or older. The USPSTF recommends obtaining measurements outside of the clinical setting for diagnostic confirmation before starting treatment.      Vision Screening   Every 1-2 years     No current recommendations      Cholesterol Testing   Once every 5 years    The USPSTF strongly recommends screening men age 59 years and older for lipid disorders.      Z13,6 Screening for cardiovascular disorders     Diabetes Screening   Two screening tests per  year for Medicare beneficiaries diagnosed with pre-diabetes   One screening per year if previously tested but not diagnosed with pre-diabetes or if never tested The USPSTF recommends screening for abnormal blood glucose as part of cardiovascular risk assessment in adults aged 49 to 70 years who are overweight or obese. Clinicians should offer or refer patients with abnormal blood glucose to intensive behavioral counseling interventions to promote a healthful diet and physical activity.        Z13.1 Screening for diabetes mellitus   R73.09 may be reported as a secondary code to indicate prediabetes   Osteoporosis Screening   (Bone Density Measurement)    Every 2 years   Who is covered: Women determined by their physician or qualified non-physician practitioner (NPP) to be estrogen deficient and at clinical risk for osteoporosis; Individuals with vertebral abnormalities; Individuals getting (or expecting to get) glucocorticoid therapy for more than 3 months; Individuals with primary hyperparathyroidism; Individuals being monitored to assess response to U.S. Food and Drug Administration (FDA)-approved osteoporosis drug therapy  The USPSTF recommends screening for osteoporosis in women age 53 years and older and in younger women whose fracture risk is equal to or  greater than that of a 77 year old white woman who has no additional risk factors.          Z87.0 Asymptomatic menopausal state   Z79.83 Long-term (current) use of bisphosphonates   E21.0 Primary hyperparathyroidism   E21.3 Hyperparathyroidism, unspec.   Also covered for vertebral fracture   Breast Cancer Screening (Mammogram)   Aged 74 through 33: one baseline; aged 76 and older: annually   Who is covered: All female Medicare beneficiaries aged 15 and older  The USPSTF recommends screening mammography for women, with or without clinical breast examination, every 1 to 2 years for women age 76 years and older.      Z12.31 Screening mammogram for  malignant neoplasm of breast   Cervical Cancer Screening (Pap Smear)    Annually if at high risk for developing cervical or vaginal cancer or childbearing age with abnormal Pap test within past 3 years; Every 2 years for women at normal risk   HPV: Once every 5 years; All asymptomatic female Medicare beneficiaries aged 77 to 44 years                 Age 77-65: Perform cytologic and HPV cotesting every 5 years (preferred), or perform cytology testing alone every 3 years (acceptable); Age 5+: Discontinue screening if there has been an adequate number of negative screening results previously (3 consecutive negative cytologic tests or 2 consecutive negative cotests in the past 10 years, with the most recent test in the past 5 years) and if there is no history of HSIL, adenocarcinoma in situ, or cancer)  Z01.411 Gynecological exam with abnormal findings; also code abnormal findings   Z01.419 without abnormal findings    For acquired absence of cervix or uterus:   Z12.72 Screening malignant neoplasm vagina and Z90.710 Acquired absence of cervix and uterus, Z90.711 Acquired absence of uterus with remaining cervical stump, or Z90.712 acquired absence of cervix w/ remaining uterus    For high risk:   Z72.51-Z72.53 High-risk heterosexual, homosexual, or bisexual behavior   Z72.89 Other problems related to lifestyle   Z77.9 Other contact w/ and (suspected) exposures hazardous to health   Z91.89 Other personal risk factors, NEC   Z92.89 Personal Hx of other medical treatment    For combined Pap smear/HPV screening:   Z11.51 Screening for HPV and Z01.411 or Z01.419 (noted above)       Colorectal Cancer Screening   Screening Fecal Occult Blood Test (FOBT) - every year - G0328   Cologuard Multitarget Stool DNA (sDNA) Test - once every 3 years   Screening flexible sigmoidoscopy - once every 4 years (unless a screening colonoscopy has been performed and then Medicare may cover a screening flexible sigmoidoscopy only  after at least 119 months)   Screening colonoscopy - every 10 years (unless a screening flexible sigmoidoscopy has been performed and then Medicare may cover a screening colonoscopy only after 47 months)   Screening barium exema (as an alternative to covered screening flexible sigmoidoscopy)   The USPSTF recommends screening for colorectal cancer starting at age 28 years and continuing until age 23 years.      Depression Screening   Frequency: annually The USPSTF recommends screening for depression in the general adult population, including pregnant and postpartum women. Screening should be implemented with adequate systems in place to ensure accurate diagnosis, effective treatment, and appropriate follow-up.      Sexually Transmitted Diseases (STDs) & HIV Screening   Annually for Medicare beneficiaries between the ages of 71  and 65 without regard to perceived risk   Annually for Medicare beneficiaries younger than 68 and adults older than 54 who are at increased risk for HIV infection   For Medicare beneficiaries who are pregnant, 3 times per pregnancy: first, when a woman is diagnosed with pregnancy; second, during the third trimester; third, at labor, if ordered by a woman's clinician         The USPSTF recommends that clinicians screen for HIV infection in adolescents and adults ages 31 to 68 years. Younger adolescents and older adults who are at increased risk should also be screened.          Z11.4 Screening for HIV    And if high risk:   Z72.51-Z72.53 High-risk heterosexual, homosexual, or bisexual behavior   Z72.89 Other problems related to lifestyle     Alcohol Misuse Screening   G0442 - annual alcohol misuse screening, 15 mins - annually   [phone] - brief face-to-face behavioral counseling for alcohol misuse, 15 mins - for those who screen positive, 4 times per year The USPSTF recommends that clinicians screen adults age 73 years or older for alcohol misuse and provide persons engaged in risky or  hazardous drinking with brief behavioral counseling interventions to reduce alcohol misuse.      Immunizations:  Prevnar 13  Pneumococcal 23  Influenza  Hepatitis B    (Prevnar 13: 1 dose at age 71+  Pneumovax 25: 1 dose one year after Prevnar 13  Influenza: Annually  Hepatitis B: One series of 3 injections for those at risk.)           No current recommendations      Advance Directive   Once; update as needed         Medical Nutrition Therapy   As necessary for diabetes or renal disease         Smoking Cessation Counseling   [phone] - smoking and tobacco-use cessation counseling visit; intermediate, greater than 3 minutes up to 10 minutes   99407 - smoking and tobacco-use cessation counseling visit; intensive, greater than 10 minutes   Frequency: two cessation attempts per year. Each attempt may include a maximum of 4 intermediate or intensive sessions, with the total annual benefit covering up to 8 sessions per year.   The USPSTF recommends that clinicians ask all adults about tobacco use, advise them to stop using tobacco, and provide behavioral interventions and U.S. Food and Drug Administration (FDA)-approved pharmacotherapy for cessation to adults who use tobacco.        F17.2- Nicotine dependence   Z87.891 Personal Hx of nicotine dependence, unspec., uncomplicated   Glaucoma Screening  . Z13.5, annually for covered Medicare beneficiaries No current recommendations    Hepatitis C Virus (HCV) Screening   Z72.89 and F19.20   U9811 - Hepatitis C antibody screening, for individual at high risk and other covered indication(s)   Who is covered: high risk for HCV infection, or born between [birth year] and 1965   Annually only for high risk Medicare beneficiaries with continued illicit injection drug use since the prior negative screening test   Once in a lifetime for Medicare beneficiaries born between [birth year] and 1965 who are not considered high risk The USPSTF recommends screening for hepatitis C virus (HCV)  infection in persons at high risk for infection. The USPSTF also recommends offering one-time screening for HCV infection to adults born between [birth year] and 1965.        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 First year: before the first lung cancer LDCT screening, Medicare beneficiaries must receive a counseling and shared decision making visit. Subsequent years: the Medicare beneficiary must receive a written order furnished during an appropriate visit with a physician or NPP The USPSTF recommends annual screening for lung cancer with low-dose computed tomography in adults ages 52 to 36 years who have a 30 pack-year smoking history and currently smoke or have quit within the past 15 years. Screening should be discontinued once a person has not smoked for 15 years or develops a health problem that substantially limits life expectancy or the ability or willingness to have curative lung surgery.      Z61.096 Personal Hx of nicotine dependence      References:  CMS Medicare Preventive Services ICN [phone] October 2016  [place] Preventive Services Task Force A and B Recommendations June 2016

## 2016-10-29 NOTE — Patient Instructions (Signed)
Prevention Guidelines, Women Ages 65 and Older  Screening tests and vaccines are an important part of managing your health. Health counseling is essential, too. Below are guidelines for these, for women ages 65 and older. Talk with your healthcare provider to make sure you're up to date on what you need.  Screening Who needs it How often   Type 2 diabetes or prediabetes All adults beginning at age 77 and adults without symptoms at any age who are overweight or obese and have 1 or more additional risk factors for diabetes At least every 3 years   Alcohol misuse All women in this age group At routine exams   Blood pressure All women in this age group Every 2 years if your blood pressure is less than 120/80 mm Hg; yearly if your systolic blood pressure is 120 to 139 mm Hg, or your diastolic blood pressure reading is 80 to 89 mm Hg   Breast cancer All women in this age group Yearly mammogram and clinical breast exam1   Cervical cancer Only women who had abnormal screening results before age 65 Talk with your healthcare provider   Chlamydia Women at increased risk for infection At routine exams   Colorectal cancer All women in this age group1 Flexible sigmoidoscopy every 5 years, or colonoscopy every 10 years, or double-contrast barium enema every 5 years; yearly fecal occult blood test or fecal immunochemical test; or a stool DNA test as often as your healthcare provider advises; talk with your healthcare provider about which tests are best for you   Depression All women in this age group At routine exams   Gonorrhea Sexually active women at increased risk for infection At routine exams   Hepatitis C Anyone at increased risk; 1 time for those born between 1945 and 1965 At routine exams   High cholesterol or triglycerides All women in this age group who are at risk for coronary artery disease At least every 5 years   HIV Women at increased risk for infection - talk with your healthcare provider At routine exams   Lung  cancer Adults age 55 to 80 who have smoked Yearly screening in smokers with 30 pack-year history of smoking or who quit within 15 years   Obesity All women in this age group At routine exams   Osteoporosis All women in this age group Bone density test at age 65, then follow-up as advised by your healthcare provider   Syphilis Women at increased risk for infection - talk with your healthcare provider At routine exams   Thyroid-Stimulating Hormone (TSH) All women in this age group Every 5 years   Tuberculosis Women at increased risk for infection - talk with your healthcare provider Ask your healthcare provider   Vision All women in this age group Every 1 to 2 years; if you have a chronic health condition, ask your healthcare provider if you need exams more often   Vaccine Who needs it How often   Chickenpox (varicella) All women in this age group who have no record of this infection or vaccine 2 doses; second dose should be given at least 4 weeks after the first dose   Hepatitis A Women at increased risk for infection - talk with your healthcare provider 2 doses given 6 months apart   Hepatitis B Women at increased risk for infection - talk with your healthcare provider 3 doses over 6 months; second dose should be given 1 month after the first dose; the third dose   should be given at least 2 months after the second dose and at least 4 months after the first dose   Haemophilus influenzaType B (HIB) Women at increased risk for infection - talk with your healthcare provider 1 to 3 doses   Influenza (flu) All women in this age group Once a year   Pneumococcalconjugate vaccine (PCV13)and pneumococcal polysaccharidevaccine (PPSV23) All women in this age group 1 dose of each vaccine   Tetanus/diphtheria/pertussis (Td/Tdap) booster All women in this age group Td every 10 years, or a one-time dose of Tdap instead of a Td booster after age 18, then Td every 10 years   Zoster All women in this age group 1 dose   Counseling  Who needs it How often   Diet and exercise Women who are overweight or obese When diagnosed, and then at routine exams   Fall prevention (exercise and vitamin D supplements) All women in this age group At routine exams   Sexually transmitted infection prevention Women at increased risk for infection - talk with your healthcare provider At routine exams   Use of daily aspirin Women ages 55 and up in this age group who are at risk for cardiovascular health problems such as stroke When your risk is known   Use of tobacco and the health effects it can cause All women in this age group Every exam   1[employer]  Date Last Reviewed: 02/12/2014   2000-2017 [employer] [address], [place] [address]. All rights reserved. This information is not intended as a substitute for professional medical care. Always follow your healthcare professional's instructions.

## 2016-10-31 ENCOUNTER — Encounter (INDEPENDENT_AMBULATORY_CARE_PROVIDER_SITE_OTHER): Payer: Self-pay

## 2016-11-04 ENCOUNTER — Encounter (INDEPENDENT_AMBULATORY_CARE_PROVIDER_SITE_OTHER): Payer: Self-pay | Admitting: Internal Medicine

## 2016-11-13 ENCOUNTER — Ambulatory Visit (INDEPENDENT_AMBULATORY_CARE_PROVIDER_SITE_OTHER): Payer: Medicare Other | Admitting: Neurology

## 2016-11-13 ENCOUNTER — Encounter (INDEPENDENT_AMBULATORY_CARE_PROVIDER_SITE_OTHER): Payer: Self-pay | Admitting: Neurology

## 2016-11-13 VITALS — BP 112/69 | HR 67

## 2016-11-13 DIAGNOSIS — R258 Other abnormal involuntary movements: Secondary | ICD-10-CM

## 2016-11-13 DIAGNOSIS — G959 Disease of spinal cord, unspecified: Secondary | ICD-10-CM

## 2016-11-13 DIAGNOSIS — R251 Tremor, unspecified: Secondary | ICD-10-CM

## 2016-11-13 DIAGNOSIS — R292 Abnormal reflex: Secondary | ICD-10-CM

## 2016-11-13 NOTE — Progress Notes (Signed)
[email or web address]    Subjective:      76 y/o R handed F with hx osteopenia, tobacco use, presents to Movement Disorders clinic for f/u of tremors mainly in her legs when standing (L>R).    She noticed the tremors around 3 years ago.  Started 'slow' but are getting worse.  She would feel she was nervous and her head was shaking.  Now, she can't stand or move without her whole body shaking vigorously, mainly in the legs. Makes her balance off. Legs start shaking when she stands up. When she is lying in bed, she can feel her body shaking. It shakes utensils, has to be careful with soup. She has felt her handwriting has changed, it is shaking. She also feels muscle cramps in her hands and legs. No reduction in dexterity.     Does not typically happen when sitting and improves when walking around.  However, if she is standing still, especially if she puts pressure on her legs, her left leg will start to shake in particular and sometimes it will lead to her whole body shaking.  She feels that it is much worse in her left leg as compared to her right leg.  She denies any specific numbness. She denies any specific weakness.  No falls and feels like she is walking okay.  The shaking again seems to mainly be when she is standing still.  She has a long history of back pain with burning and tingling down into both legs.  She has no shooting pain down the legs.  No specific neck pain, but does feel a bit stiff.  No shooting pain down the arms.  No saddle anesthesia.  No loss of bowel or bladder control.  No specific rigidity in arms or legs.  Does not get dizzy when standing.  No specific change in gait.  No changes in face.    No change to sense of smell, no constipation.  Sleep is good, not fragmented. She does feel burning and tingles in her legs; both legs. Can be painful. + fatigue. No falls. Had the flu last week, which made her especially weak. + occasional LH/D on standing.     No changes to  medication or illnesses 3 yrs ago. Unclear pulmonary history, has not used inhalers in years.  Used it once last week d/t influenza A.     + heavy smoker, no EtOH use. + sister with tremors, who is younger than her. No other family hx noted.     MRI Brain reviewed, 10/23/16:    FINDINGS:   There is minimal diffuse atrophy with small vessels ischemic changes.   The ventricular system is normal in size and contour.   There is no acute intracranial hemorrhage. There is no herniation.   There are no extra-axial fluid collections.   There are no concerning areas of abnormal signal intensity.   The diffusion sequences show no restriction to suggest an acute infarct.    The cerebellar tonsils are normally positioned.  There is mild mucosal thickening involving the bilateral maxillary  sinuses. The mastoid air cells are clear.  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    IMPRESSION:     No acute intracranial abnormalities. There is diffuse  atrophy with small vessels changes.    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 Please see above  for further details.        The following portions of the patient's history were reviewed and updated as appropriate: allergies, past family history, past medical history, past social history, past surgical history and problem list.    Review of Systems  + leg tremors  No recent illness. Denies fever, chills, cough, sinus pain, eye pain, eye redness, ear pain, rhinorrhea, sore throat, chest pain, SOB, wheezing, abd pain, Nausea, Vomiting, diarrhea, constipation, dysuria, or rashes.  All other systems reviewed and are negative except as previously noted in the HPI.    Objective:     Vitals:    11/13/16 1014   BP: 112/69   Pulse: 67     Constitutional: NAD   Eyes: Clear  conjunctiva  Cardiovascular: RRR  Respiratory: CTA B   Musculoskeletal: Normal posture and muscle bulk.  Integumentary: No abnormal rash noted  Psych: Normal affect    Neurological:   Mental Status: Alert & oriented to person, place, month, & year.   Language: Spontaneous & fluent speech, good comprehension.    Cranial Nerves:  II, III: PERRL  III, IV, VI: EOMI, No nystagmus, no palsies, no ptosis  V: Intact to LT V1-V3 distribution bilaterally.   VI: Symmetrical face and expression.   VIII: Hearing intact to finger rub bilaterally.   IX, X: Palate/Uvula elevates symmetrically.   XI: 5/5 Trapezius & SCM bilaterally.   XII: Tongue is midline.     Motor Exam: Normal bulk and tone. No clonus. No pronator drift. Strength 5/5 throughout and symmetric. ? weakness in grip strength, intrinsic muscle atrophy in hands. Weakness L leg in hip and knee.    Sensation: Sensation to LT intact grossly through symmetrically. Romberg positive.     Cerebellar:   Finger to Nose: intact bilaterally, with slight terminal dysmetria bilaterally.     Reflexes: 3+ throughout, symmetric, L laterality in leg ? down-going toes. + Hoffman bilaterally.  No clonus.     Station/ Gait: Wider based, ataxic component, looking down for balance. No tremor with walking.    When standing with pressure on the right leg she has a high-amplitude shaking in the left leg that can affect her whole body.  When the leg is relaxed or she is standing on her right leg, it is almost still.  Nonoticeable tremor when standing still with both legs more relaxed.  No notable tremor in arms.  Some increased tone in the legs with a slight spastic catch.  Did not have any obvious clonus in her ankles.    Assessment:     76 y/o R handed F with hx osteopenia, tobacco use, presents to Movement Disorders clinic for evaluation of tremors mainly in her legs when standing (L>R). Her exam continues to progress, showing a wider based ataxic gait pattern, hyperreflexia with + Hoffman  bilaterally. MRI brain nonfocal, as was blood work, but MRI cervical spine as above, shows 2 levels of central stenosis, which appears to be the origin for her ongoing muscle weakness, wider based gait and abnormal movements which appear to be a combination of weakness and clonus.     Given the structural origin, I'm referring to Neurosurgery for an opinion.      Plan:     As above.     RTC in 6 weeks. Patient can follow up sooner if needed. In the meantime, patient will contact the office with any questions or concerns.     -----------------------------------------------------------  Additional notes and data scanned including patient questionnaire which may contain pertinent information to visit.     More than 50% of this 25 min office visit was spent counseling the patient on:   Pt's specific disease state including discussion about the medical condition, diagnostic and treatment options, as well as the plan as above.  We also discussed medication options, what pt is taking, indications and side effects. Finally we discussed specific lifestyle issues related to pt's condition including need for exercise and other lifestyle modifications.      Wilson Sample "Johnston Ebbs, MD  Co-Director  Movement Disorders Specialist  [facility]  [facility]    [address]  [address]  [place] [address]    [place], [place] [address]  T [phone]  F [phone]   T [phone]  F [phone]     [email or web address]

## 2017-01-03 ENCOUNTER — Emergency Department (HOSPITAL_COMMUNITY): Payer: Medicare Other

## 2017-01-03 ENCOUNTER — Emergency Department (HOSPITAL_COMMUNITY)
Admission: EM | Admit: 2017-01-03 | Discharge: 2017-01-03 | Disposition: A | Discharge status: Home / Self Care | Payer: Medicare Other | Attending: Emergency Medicine | Admitting: Emergency Medicine

## 2017-01-03 DIAGNOSIS — S0181XA Laceration without foreign body of other part of head, initial encounter: Secondary | ICD-10-CM

## 2017-01-03 DIAGNOSIS — Y9389 Activity, other specified: Secondary | ICD-10-CM | POA: Diagnosis not present

## 2017-01-03 DIAGNOSIS — S01111A Laceration without foreign body of right eyelid and periocular area, initial encounter: Secondary | ICD-10-CM | POA: Diagnosis not present

## 2017-01-03 DIAGNOSIS — S0990XA Unspecified injury of head, initial encounter: Secondary | ICD-10-CM | POA: Diagnosis not present

## 2017-01-03 DIAGNOSIS — Y999 Unspecified external cause status: Secondary | ICD-10-CM | POA: Insufficient documentation

## 2017-01-03 DIAGNOSIS — S8992XA Unspecified injury of left lower leg, initial encounter: Secondary | ICD-10-CM | POA: Diagnosis present

## 2017-01-03 DIAGNOSIS — Y92410 Unspecified street and highway as the place of occurrence of the external cause: Secondary | ICD-10-CM | POA: Diagnosis not present

## 2017-01-03 DIAGNOSIS — S01119A Laceration without foreign body of unspecified eyelid and periocular area, initial encounter: Secondary | ICD-10-CM

## 2017-01-03 DIAGNOSIS — S82032A Displaced transverse fracture of left patella, initial encounter for closed fracture: Secondary | ICD-10-CM | POA: Diagnosis not present

## 2017-01-03 DIAGNOSIS — W010XXA Fall on same level from slipping, tripping and stumbling without subsequent striking against object, initial encounter: Secondary | ICD-10-CM | POA: Insufficient documentation

## 2017-01-03 MED ORDER — MORPHINE SULFATE (PF) 2 MG/ML IV SOLN: 4.0000 mg | Freq: Once | INTRAVENOUS | Status: DC

## 2017-01-03 MED ORDER — OXYCODONE-ACETAMINOPHEN 5-325 MG PO TABS: 1.0000 | ORAL_TABLET | ORAL | 0 refills | Status: AC | PRN

## 2017-01-03 MED ORDER — OXYCODONE-ACETAMINOPHEN 5-325 MG PO TABS
1.0000 | ORAL_TABLET | Freq: Once | ORAL | Status: AC
  Administered 2017-01-03: 1 via ORAL
  Filled 2017-01-03: qty 1

## 2017-01-03 NOTE — ED Notes (Signed)
Patient unable to use walker due to unable to bear weight on LLE. Patient unable to use crutches due to weakness. Dr. Preston FleetingGlick made aware and [facility] contacted for wheelchair. Patient to CT and returned. Family at bedside.

## 2017-01-03 NOTE — ED Notes (Signed)
Patient placed in car with family in stable condition with knee immobilizer in place-awaiting [facility] to provide patient with a wheelchair. Rx for oxycodone given for pain control.

## 2017-01-03 NOTE — Discharge Instructions (Signed)
Have your doctor back home refer you to an orthopedic doctor - you should be seen in the next 3-5 days. Wear the knee immobilizer at all times until you see the orthopedic doctor.

## 2017-01-03 NOTE — ED Provider Notes (Signed)
[facility]-EMERGENCY DEPT Provider Note   CSN: [id number] Arrival date & time: 01/03/17  4098 By signing my name below, I, Levon Hedger, attest that this documentation has been prepared under the direction and in the presence of Dione Booze, MD . Electronically Signed: Levon Hedger, Scribe. 01/03/2017. 3:10 AM.   History   Chief Complaint Chief Complaint  Patient presents with  . Knee Pain  . Fall   HPI Diana Gilbert is a 76 y.o. female who presents to the Emergency Department complaining of sudden onset, 10/10 pain to left knee s/p mechanical fall tonight PTA. Pt states she was crossing a median to get [facility] when she fell off a median and fell, striking her left side. Pt reports she is unable to move her knee. Bruising and swelling noted to the area. She sustained associated laceration to right lateral eyebrow. Pt is not currently followed by a orthopedist. She denies any syncope, nausea or back pain. Pt has no other acute complaints or associated symptoms at this time.    The history is provided by the patient. No language interpreter was used.   No past medical history on file.  There are no active problems to display for this patient.  No past surgical history on file.  OB History    No data available       Home Medications    Prior to Admission medications   Not on File    Family History No family history on file.  Social History Social History  Substance Use Topics  . Smoking status: Not on file  . Smokeless tobacco: Not on file  . Alcohol use Not on file     Allergies   Patient has no allergy information on record.   Review of Systems Review of Systems  Gastrointestinal: Negative for nausea.  Musculoskeletal: Positive for arthralgias. Negative for back pain.  Skin: Positive for wound.  Neurological: Negative for syncope.  All other systems reviewed and are negative.  Physical Exam Updated Vital Signs BP 113/62 (BP Location: Left Arm)   Pulse 86    Resp 20   Ht 4\' 11"  (1.499 m)   Wt 112 lb (50.8 kg)   SpO2 98%   BMI 22.62 kg/m   Physical Exam  Constitutional: She is oriented to person, place, and time. She appears well-developed and well-nourished.  HENT:  Head: Normocephalic.  Laceration above the right eye.   Eyes: EOM are normal. Pupils are equal, round, and reactive to light.  Neck: Normal range of motion. No JVD present.  Cardiovascular: Normal rate, regular rhythm and normal heart sounds.   No murmur heard. Pulmonary/Chest: Effort normal and breath sounds normal. She has no wheezes. She has no rales. She exhibits no tenderness.  Abdominal: Soft. Bowel sounds are normal. She exhibits no distension and no mass. There is no tenderness.  Musculoskeletal: Normal range of motion. She exhibits no edema.  Marked ecchymosis and swelling anterior aspect left knee. Marked tenderness in the same location. No knee effusion. Unable to extend left knee.   Lymphadenopathy:    She has no cervical adenopathy.  Neurological: She is alert and oriented to person, place, and time. No cranial nerve deficit. She exhibits normal muscle tone. Coordination normal.  Skin: Skin is warm and dry. No rash noted.  Psychiatric: She has a normal mood and affect. Her behavior is normal. Judgment and thought content normal.  Nursing note and vitals reviewed.  ED Treatments / Results  DIAGNOSTIC STUDIES:  Oxygen Saturation  is 98% on RA, normal by my interpretation.    COORDINATION OF CARE:  3:09 AM Will order CT head. Discussed treatment plan which includes percocet with pt at bedside and pt agreed to plan.   Radiology Ct Head Wo Contrast  Result Date: 01/03/2017 CLINICAL DATA:  Larey Seat tonight, with laceration in the right periorbital region. EXAM: CT HEAD WITHOUT CONTRAST CT CERVICAL SPINE WITHOUT CONTRAST TECHNIQUE: Multidetector CT imaging of the head and cervical spine was performed following the standard protocol without intravenous contrast.  Multiplanar CT image reconstructions of the cervical spine were also generated. COMPARISON:  None. FINDINGS: CT HEAD FINDINGS Brain: There is no intracranial hemorrhage, mass or evidence of acute infarction. There is mild generalized atrophy. There is mild chronic microvascular ischemic change. There is no significant extra-axial fluid collection. No acute intracranial findings are evident. Vascular: No hyperdense vessel or unexpected calcification. Skull: Normal. Negative for fracture or focal lesion. Sinuses/Orbits: No acute finding. Other: None. CT CERVICAL SPINE FINDINGS Alignment: Normal. Skull base and vertebrae: No acute fracture. No primary bone lesion or focal pathologic process. Soft tissues and spinal canal: No prevertebral fluid or swelling. No visible canal hematoma. 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 The facet articulations are mildly arthritic but intact. Upper chest: Negative. Other: None IMPRESSION: 1. No acute intracranial findings. There is mild generalized atrophy and chronic appearing white matter hypodensities which likely represent small vessel ischemic disease. 2. Negative for acute cervical spine fracture Electronically Signed   By: Ellery Plunk M.D.   On: 01/03/2017 05:22   Ct Cervical Spine Wo Contrast  Result Date: 01/03/2017 CLINICAL DATA:  Larey Seat tonight, with laceration in the right periorbital region. EXAM: CT HEAD WITHOUT CONTRAST CT CERVICAL SPINE WITHOUT CONTRAST TECHNIQUE: Multidetector CT imaging of the head and cervical spine was performed following the standard protocol without intravenous contrast. Multiplanar CT image reconstructions of the cervical spine were also generated. COMPARISON:  None. FINDINGS: CT HEAD FINDINGS Brain: There is no intracranial hemorrhage, mass or evidence of acute infarction. There is mild generalized atrophy. There is mild chronic microvascular ischemic change. There is no significant extra-axial  fluid collection. No acute intracranial findings are evident. Vascular: No hyperdense vessel or unexpected calcification. Skull: Normal. Negative for fracture or focal lesion. Sinuses/Orbits: No acute finding. Other: None. CT CERVICAL SPINE FINDINGS Alignment: Normal. Skull base and vertebrae: No acute fracture. No primary bone lesion or focal pathologic process. Soft tissues and spinal canal: No prevertebral fluid or swelling. No visible canal hematoma. 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 The facet articulations are mildly arthritic but intact. Upper chest: Negative. Other: None IMPRESSION: 1. No acute intracranial findings. There is mild generalized atrophy and chronic appearing white matter hypodensities which likely represent small vessel ischemic disease. 2. Negative for acute cervical spine fracture Electronically Signed   By: Ellery Plunk M.D.   On: 01/03/2017 05:22   Dg Knee Complete 4 Views Left  Result Date: 01/03/2017 CLINICAL DATA:  Trip and fall injury while crossing history tonight. EXAM: LEFT KNEE - COMPLETE 4+ VIEW COMPARISON:  None. FINDINGS: There is a moderately distracted patellar fracture, oriented transverse through the midpole. Marked prepatellar soft tissue swelling. Femur and tibia are intact. IMPRESSION: Patellar fracture Electronically Signed   By: Ellery Plunk M.D.   On: 01/03/2017 03:03    Procedures Procedures (including critical care time) LACERATION REPAIR Performed by: ZOXWR,UEAVW Authorized by: UJWJX,BJYNW Consent: Verbal consent obtained. Risks and benefits: risks, benefits and  alternatives were discussed Consent given by: patient Patient identity confirmed: provided demographic data Prepped and Draped in normal sterile fashion Wound explored  Laceration Location: For3head  Laceration Length: 2 cm  No Foreign Bodies seen or palpated  Anesthesia: local infiltration  Local anesthetic: None  Amount of  cleaning: standard  Skin closure: Close   Technique: Tissue adhesive   Patient tolerance: Patient tolerated the procedure well with no immediate complications.  Knee Immobilizer APPLICATION Date/Time: 5:43 AM Authorized by: ZOXWR,UEAVWGLICK,Kearstin Learn Consent: Verbal consent obtained. Risks and benefits: risks, benefits and alternatives were discussed Consent given by: patient Knee immobilizer applied by: orthopedic technician Location details: Left leg  Supplies used: Knee immobilizer  Post-procedure: The splinted body part was neurovascularly unchanged following the procedure. Patient tolerance: Patient tolerated the procedure well with no immediate complications.     Medications Ordered in ED Medications  oxyCODONE-acetaminophen (PERCOCET/ROXICET) 5-325 MG per tablet 1 tablet (1 tablet Oral Given 01/03/17 0334)     Initial Impression / Assessment and Plan / ED Course  I have reviewed the triage vital signs and the nursing notes.  Pertinent imaging results that were available during my care of the patient were reviewed by me and considered in my medical decision making (see chart for details).  Fall with injury to left knee and laceration above the right eye. The x-ray shows transverse fracture of the left patella. She is sent for CT of head and cervical spine which show no acute injury. She was given oxycodone have acetaminophen for pain with reasonably good relief. Knee is placed in a knee immobilizer. She is given prescriptions for wheelchair and walker, and is discharged with prescription for oxycodone have acetaminophen. She will need to follow-up with an orthopedic physician [place] home in [place], but is given the name of on: Orthopedics here if there is any difficulty getting in to see a physician back home.  Final Clinical Impressions(s) / ED Diagnoses   Final diagnoses:  Fall from slip, trip, or stumble, initial encounter  Closed displaced transverse fracture of left  patella, initial encounter  Laceration of eyebrow and forehead, initial encounter    New Prescriptions New Prescriptions   OXYCODONE-ACETAMINOPHEN (PERCOCET) 5-325 MG TABLET    Take 1 tablet by mouth every 4 (four) hours as needed for moderate pain.   I personally performed the services described in this documentation, which was scribed in my presence. The recorded information has been reviewed and is accurate.       Dione BoozeGlick, Kelbi Renstrom, MD 01/03/17 [id number]

## 2017-01-03 NOTE — ED Notes (Signed)
Pt was [place] a [place] to get to [facility], fell off of median. Laceration on left side of face, pain to left knee. Cannot move left knee.

## 2017-01-07 ENCOUNTER — Other Ambulatory Visit (INDEPENDENT_AMBULATORY_CARE_PROVIDER_SITE_OTHER): Payer: Self-pay | Admitting: Internal Medicine

## 2017-01-07 DIAGNOSIS — K2270 Barrett's esophagus without dysplasia: Secondary | ICD-10-CM

## 2017-01-14 ENCOUNTER — Ambulatory Visit (FREE_STANDING_LABORATORY_FACILITY): Payer: Medicare Other | Admitting: Internal Medicine

## 2017-01-14 ENCOUNTER — Encounter (INDEPENDENT_AMBULATORY_CARE_PROVIDER_SITE_OTHER): Payer: Self-pay | Admitting: Internal Medicine

## 2017-01-14 VITALS — BP 98/52 | HR 69 | Temp 97.8°F | Resp 18 | Wt 111.4 lb

## 2017-01-14 DIAGNOSIS — Z01818 Encounter for other preprocedural examination: Secondary | ICD-10-CM

## 2017-01-14 DIAGNOSIS — G8929 Other chronic pain: Secondary | ICD-10-CM

## 2017-01-14 DIAGNOSIS — K2270 Barrett's esophagus without dysplasia: Secondary | ICD-10-CM

## 2017-01-14 DIAGNOSIS — M5440 Lumbago with sciatica, unspecified side: Secondary | ICD-10-CM

## 2017-01-14 DIAGNOSIS — M8580 Other specified disorders of bone density and structure, unspecified site: Secondary | ICD-10-CM

## 2017-01-14 DIAGNOSIS — J449 Chronic obstructive pulmonary disease, unspecified: Secondary | ICD-10-CM

## 2017-01-14 DIAGNOSIS — Z720 Tobacco use: Secondary | ICD-10-CM

## 2017-01-14 DIAGNOSIS — M5442 Lumbago with sciatica, left side: Secondary | ICD-10-CM

## 2017-01-14 LAB — COMPREHENSIVE METABOLIC PANEL
ALT: 14 U/L (ref 0–55)
AST (SGOT): 18 U/L (ref 5–34)
Albumin/Globulin Ratio: 1.4 (ref 0.9–2.2)
Albumin: 3.9 g/dL (ref 3.5–5.0)
Alkaline Phosphatase: 68 U/L (ref 37–106)
BUN: 18 mg/dL (ref 7.0–19.0)
Bilirubin, Total: 0.6 mg/dL (ref 0.1–1.2)
CO2: 28 mEq/L (ref 21–29)
Calcium: 9.5 mg/dL (ref 7.9–10.2)
Chloride: 105 mEq/L (ref 100–111)
Creatinine: 0.7 mg/dL (ref 0.4–1.5)
Globulin: 2.7 g/dL (ref 2.0–3.7)
Glucose: 87 mg/dL (ref 70–100)
Potassium: 4.1 mEq/L (ref 3.5–5.1)
Protein, Total: 6.6 g/dL (ref 6.0–8.3)
Sodium: 139 mEq/L (ref 136–145)

## 2017-01-14 LAB — CBC
Absolute NRBC: 0 10*3/uL
Hematocrit: 36.3 % — ABNORMAL LOW (ref 37.0–47.0)
Hgb: 11.6 g/dL — ABNORMAL LOW (ref 12.0–16.0)
MCH: 30.1 pg (ref 28.0–32.0)
MCHC: 32 g/dL (ref 32.0–36.0)
MCV: 94.3 fL (ref 80.0–100.0)
MPV: 11.7 fL (ref 9.4–12.3)
Nucleated RBC: 0 /100 WBC (ref 0.0–1.0)
Platelets: 186 10*3/uL (ref 140–400)
RBC: 3.85 10*6/uL — ABNORMAL LOW (ref 4.20–5.40)
RDW: 12 % (ref 12–15)
WBC: 4.63 10*3/uL (ref 3.50–10.80)

## 2017-01-14 LAB — GFR: EGFR: >60

## 2017-01-14 LAB — HEMOLYSIS INDEX: Hemolysis Index: 7 (ref 0–18)

## 2017-01-14 NOTE — Progress Notes (Signed)
Have you seen any specialists/other providers since your last visit with us?    No    Arm preference verified?   Yes    The patient is due for spirometry, shingles vaccine and pneumonia vaccine

## 2017-01-14 NOTE — Progress Notes (Signed)
Subjective :   Michele [name] is a 76 y.o. female  Presents with her Daughter  today   Chief Complaint   Patient presents with   . Pre-op Exam     ORIF Left Patella Fracture      Here today for consultation for pre op exam at the request of her Ortho  Dr. Ramiro Harvest at [facility]  .   Plan for ORIF- left patella fracture  on 01/16/17.   H/o fall on 01/02/17 while she was at [place] for her husband's funeral. She tripped on the pavement and fell on her knee.   Went to local ER-  Daughter reports her CT head and other work up were normal except fracture of her left patella. On cast since then.     Her medical problems include;   Problem   Tobacco Abuse    0.5ppd x 50 years . trying to quit gradually 3-5 cig/ day.      Copd (Chronic Obstructive Pulmonary Disease)    Stable. On symbicort daily and albuterol inhaler prn .    11/12/15;  Spirometry - suggestive of moderate to severe Obstructive disease. FEv1- 52 %.   Chronic h/o smoking- 50 yrs. 7-11 cig/ day   CXR- normal.     Barrett Esophagus    Stable. On prilosec 20mg  once daily.   Follow up by Dr Gwenlyn Perking  .     Chronic Back Pain    MRI lumbar spine degenerative changes involving the lumbosacral spine. Right lateral disc protrusion at the L3-L4 level associated with right L3 nerve root impingement. Moderate to severe central canal stenoses at the L3-L4 and L4-L5 levels. Annular tears involving the L4-L5 and L5-S1 intervertebral discs.(2012) not taking  Pain medications .     Osteopenia    Taking Calcium and vit D supplements         Denies any h/o HTN/DM/ heart disease/ stroke.   No h/o bleeding or clotting problems.   Denies any complications with surgery or anesthesia in the past.   Denies any chest pain, SOB, palpitations, diaphoresis on rest or exertion. Denies any dizziness or syncope.     The following portions of the patient's history were reviewed and updated as appropriate: allergies, current medications, past family history, past medical history,  past social history, past surgical history and problem list.   Family History   Problem Relation Age of Onset   . Breast cancer Neg Hx    . Ovarian cancer Neg Hx    . Cancer Neg Hx      Past Medical History:   Diagnosis Date   . Abnormal EKG    . Arthralgia    . Barrett's esophagus    . Cervical spinal stenosis    . Colonic polyp    . Depression    . Disc disorder    . Gastroesophageal reflux disease    . Lower back pain    . Numbness and tingling of both legs 02/06/2015   . Osteoarthritis    . Osteopenia      Past Surgical History:   Procedure Laterality Date   . APPENDECTOMY  1960?    when was a teenager   . TUBAL LIGATION  1973     No Known Allergies  Current Outpatient Prescriptions on File Prior to Visit   Medication Sig Dispense Refill   . Calcium Carbonate (CALCIUM 600 PO) Take 1 tablet by mouth daily.         Marland Kitchen  omeprazole (PRILOSEC) 20 MG capsule TAKE ONE CAPSULE BY MOUTH ONE TIME DAILY  90 capsule 0   . budesonide-formoterol (SYMBICORT) 80-4.5 MCG/ACT inhaler Inhale 2 puffs into the lungs 2 (two) times daily. 10.2 g 1   . escitalopram (LEXAPRO) 5 MG tablet Take 1 tablet (5 mg total) by mouth daily. 30 tablet 1   . loratadine (CLARITIN) 10 MG tablet Take 1 tablet (10 mg total) by mouth daily. 30 tablet 0   . sodium chloride (OCEAN NASAL SPRAY) 0.65 % nasal spray 1 spray by Nasal route as needed for Congestion. 30 mL 0     No current facility-administered medications on file prior to visit.      Social History     Social History   . Marital status: Married     Spouse name: N/A   . Number of children: N/A   . Years of education: N/A     Occupational History   . Not on file.     Social History Main Topics   . Smoking status: Current Every Day Smoker     Packs/day: 0.50     Years: 50.00     Types: Cigarettes   . Smokeless tobacco: Never Used   . Alcohol use No   . Drug use: No   . Sexual activity: No     Other Topics Concern   . Not on file     Social History Narrative   . No narrative on file     ROS :    Constitutional: Negative for fever, chills and diaphoresis.   Respiratory: Negative for chest tightness, shortness of breath and wheezing.   Cardiovascular: Negative for chest pain, palpitations and leg swelling.   Gastrointestinal: Negative for abdominal pain, vomiting, blood in stool .   GU: Negative for dysuria, hematuria,   Musculoskeletal: Negative for arthralgias and gait problem.   Neurological: Negative for seizures and headaches.   Hematological: Negative for bleeding or clotting problems   Psychiatric/Behavioral: Negative     Objective:   Vitals reviewed: Blood pressure 98/52, pulse 69, temperature 97.8 F (36.6 C), temperature source Oral, resp. rate 18, weight 50.5 kg (111 lb 6.4 oz).    Constitutional: Pt. Well developed and well nourished  HENT:   Head: Normocephalic and atraumatic.   Neck: Normal range of motion. Neck supple.   Cardiovascular: Normal rate and regular rhythm. No murmur heard.   Pulmonary/Chest: Effort normal and Clear breath sounds on both sides. .   Abdominal: Soft, not distended, not tender, Bowel sounds are normal.   Musculoskeletal: Normal range of motion.   Neurological: alert and oriented to person, place, and time. Intact cranial nerves. Normal strength and tone. Reflexes normal . Gait normal. Speech normal.   Psych; normal mood and affect      Assessment and Plan:     1. Preop examination  CBC w/o differential    Comprehensive Metabolic Panel    ECG 12 lead   2. Barrett's esophagus without dysplasia     3. Chronic obstructive pulmonary disease, unspecified COPD type     4. Osteopenia, unspecified location     5. Tobacco abuse     6. Chronic bilateral low back pain with sciatica, sciatica laterality unspecified         EKG today - Normal - Sinus rhythm. No ST -T wave changes.   COPD: stable.   Tobacco abuse; counseled on smoking cessation  Baseline BP - low Normal. Asymptomatic. Advised adequate hydration.  Labs ordered as above.   Patient medically cleared for surgery .       Pending lab results. Will fax once all results available

## 2017-01-15 ENCOUNTER — Encounter (INDEPENDENT_AMBULATORY_CARE_PROVIDER_SITE_OTHER): Payer: Self-pay | Admitting: Internal Medicine

## 2017-03-15 ENCOUNTER — Encounter (INDEPENDENT_AMBULATORY_CARE_PROVIDER_SITE_OTHER): Payer: Self-pay

## 2017-03-20 DIAGNOSIS — K449 Diaphragmatic hernia without obstruction or gangrene: Secondary | ICD-10-CM | POA: Insufficient documentation

## 2017-03-20 DIAGNOSIS — D128 Benign neoplasm of rectum: Secondary | ICD-10-CM | POA: Insufficient documentation

## 2017-03-20 DIAGNOSIS — D123 Benign neoplasm of transverse colon: Secondary | ICD-10-CM | POA: Insufficient documentation

## 2017-03-26 ENCOUNTER — Encounter (INDEPENDENT_AMBULATORY_CARE_PROVIDER_SITE_OTHER): Payer: Self-pay | Admitting: Internal Medicine

## 2017-03-26 ENCOUNTER — Ambulatory Visit (INDEPENDENT_AMBULATORY_CARE_PROVIDER_SITE_OTHER): Payer: Medicare Other | Admitting: Internal Medicine

## 2017-03-26 VITALS — BP 120/67 | HR 65 | Temp 98.6°F | Wt 112.0 lb

## 2017-03-26 DIAGNOSIS — K2270 Barrett's esophagus without dysplasia: Secondary | ICD-10-CM

## 2017-03-26 DIAGNOSIS — R490 Dysphonia: Secondary | ICD-10-CM

## 2017-03-26 DIAGNOSIS — R070 Pain in throat: Secondary | ICD-10-CM

## 2017-03-26 MED ORDER — OMEPRAZOLE 20 MG PO CPDR
20.0000 mg | DELAYED_RELEASE_CAPSULE | Freq: Two times a day (BID) | ORAL | 0 refills | Status: DC
Start: 2017-03-26 — End: 2017-06-12

## 2017-03-26 NOTE — Progress Notes (Signed)
Have you seen any specialists/other providers since your last visit with us?    No    Arm preference verified?   Yes    The patient is due for spirometry, influenza vaccine, shingles vaccine and pneumonia vaccine

## 2017-03-29 NOTE — Progress Notes (Signed)
Subjective:       Patient ID: Michele [name] is a 76 y.o. female presents with her daughter today  Chief Complaint   Patient presents with   . Laryngitis     yellow phlegm         HPI    C/o discomfort on throat associated with hoarseness of voice. Denies any pain or difficulty in swallowing / breathing. Denies any URI symptoms. Feels like thick phlegm down the throat.   Denies any nausea/ vomiting/ choking sensation.   She has h/o barretts - on prilosec 20mg  once daily.   H/o chronic tobacco abuse- quit smoking since 7/18     The following portions of the patient's history were reviewed and updated as appropriate: She  has a past medical history of Abnormal EKG; Arthralgia; Barrett's esophagus; Cervical spinal stenosis; Colonic polyp; Depression; Disc disorder; Gastroesophageal reflux disease; Lower back pain; Numbness and tingling of both legs (02/06/2015); Osteoarthritis; and Osteopenia.  She  does not have any pertinent problems on file.  She  has a past surgical history that includes Appendectomy (1960?) and Tubal ligation (1973).  Her family history is not on file.  She  reports that she has been smoking Cigarettes.  She has a 25.00 pack-year smoking history. She has never used smokeless tobacco. She reports that she does not drink alcohol or use drugs.  She has a current medication list which includes the following prescription(s): aspirin ec, calcium carbonate, escitalopram, hydrocodone-acetaminophen, loratadine, and omeprazole.  She has No Known Allergies..    Review of Systems   Constitutional: Negative for chills, fatigue and fever.   HENT: Positive for postnasal drip and voice change. Negative for congestion, ear pain, rhinorrhea, sinus pressure, sneezing and trouble swallowing.    Eyes: Positive for photophobia.   Respiratory: Negative for cough, choking, chest tightness and shortness of breath.    Cardiovascular: Negative for chest pain and palpitations.   Gastrointestinal: Negative for nausea and  vomiting.   Neurological: Negative for dizziness, light-headedness and headaches.           Objective:    Physical Exam   Constitutional: She appears well-developed and well-nourished.   HENT:   Mouth/Throat: Oropharynx is clear and moist. No oropharyngeal exudate.   Neck: Normal range of motion. No thyromegaly present.   Cardiovascular: Normal rate, regular rhythm and normal heart sounds.    Pulmonary/Chest: Effort normal and breath sounds normal.   Lymphadenopathy:     She has no cervical adenopathy.   Nursing note and vitals reviewed.  Blood pressure 120/67, pulse 65, temperature 98.6 F (37 C), temperature source Oral, weight 50.8 kg (112 lb).        Assessment:       1. Throat discomfort  Ambulatory referral to ENT   2. Hoarseness of voice  Ambulatory referral to ENT   3. Barrett's esophagus without dysplasia  omeprazole (PRILOSEC) 20 MG capsule           Plan:       Throat discomfort / hoarseness of voice;   GERD vs allergies vs larynx pathology  - advised increase prilosec to 20mg  BID  - OTC claritin once daily.   - f/u with ENT if sx does not continue to improve or resolve in next 1-2 weeks.     - RTC if symptoms does not improve or worsens.     Procedures

## 2017-04-14 ENCOUNTER — Encounter (INDEPENDENT_AMBULATORY_CARE_PROVIDER_SITE_OTHER): Payer: Self-pay

## 2017-05-15 ENCOUNTER — Encounter (INDEPENDENT_AMBULATORY_CARE_PROVIDER_SITE_OTHER): Payer: Self-pay

## 2017-06-12 ENCOUNTER — Ambulatory Visit (INDEPENDENT_AMBULATORY_CARE_PROVIDER_SITE_OTHER): Payer: Medicare Other | Admitting: Family

## 2017-06-12 ENCOUNTER — Encounter (INDEPENDENT_AMBULATORY_CARE_PROVIDER_SITE_OTHER): Payer: Self-pay | Admitting: Family

## 2017-06-12 VITALS — BP 114/59 | HR 79 | Temp 98.5°F | Resp 18 | Wt 114.2 lb

## 2017-06-12 DIAGNOSIS — K2270 Barrett's esophagus without dysplasia: Secondary | ICD-10-CM

## 2017-06-12 DIAGNOSIS — J069 Acute upper respiratory infection, unspecified: Secondary | ICD-10-CM

## 2017-06-12 MED ORDER — BENZONATATE 200 MG PO CAPS
200.0000 mg | ORAL_CAPSULE | Freq: Three times a day (TID) | ORAL | 0 refills | Status: DC | PRN
Start: 2017-06-12 — End: 2017-08-20

## 2017-06-12 MED ORDER — FLUTICASONE PROPIONATE 50 MCG/ACT NA SUSP
2.0000 | Freq: Every day | NASAL | 1 refills | Status: DC
Start: 2017-06-12 — End: 2018-04-21

## 2017-06-12 MED ORDER — OMEPRAZOLE 20 MG PO CPDR
20.0000 mg | DELAYED_RELEASE_CAPSULE | Freq: Two times a day (BID) | ORAL | 0 refills | Status: DC
Start: 2017-06-12 — End: 2017-08-20

## 2017-06-12 NOTE — Patient Instructions (Signed)
Adult Self-Care for Colds    Colds are caused by viruses. They can't be cured with antibiotics. However, you can ease symptoms and support your body's efforts to heal itself. No matter which symptoms you have, be sure to:   Drink plenty of fluids (water or clear soup)   Stop smoking and drinking alcohol   Get plenty of rest  Understand a fever   Take your temperature several times a day. If your fever is100.4F(38.0C) for more than a day, call your healthcare provider.   Relax, lie down. Go to bed if you want. Just get off your feet and rest. Also, drink plenty of fluids to avoid dehydration.   Take acetaminophen or a nonsteroidal anti-inflammatory agent (NSAID), such as ibuprofen.  Treat a troubled nose kindly   Breathe steam or heated humidified air to open blocked nasal passages. Stand in a hot shower or use a vaporizer. Be careful not to get burned by the steam.   Saline nasal sprays and decongestant tablets help open a stuffy nose. Antihistamines can also help, but they can cause side effects such as drowsiness and drying of the eyes, nose, and mouth.  Soothe a sore throat and cough   Gargle every2hours with1/4teaspoon of salt dissolved in1/2 cup of warm water. Suck on throat lozenges and cough drops to moisten your throat.   Cough medicines are available but it is unclear how well they actually work.   Take acetaminophen or an NSAID, such as ibuprofen, to ease throat pain  Ease digestive problems   Put fluids back into your body. Take frequent sips of clear liquids such as water or broth. Avoid drinks that have a lot of sugar in them, such as juices and sodas. These can make diarrhea worse. Older children and adults can drink sports drinks.   As your appetite returns, you can resume your normal diet. Ask your healthcare provider if there are any foods you should avoid.  When to seek medical care  When you first notice symptoms, ask your healthcare provider if antiviral medicines are  appropriate.Antibiotics should not be taken for colds or flu. Also, call your healthcare provider if you have any of the following symptoms or if you aren't feeling better after 7 days:   Shortness of breath   Pain or pressure in the chest or belly (abdomen)   Worsening symptoms, especially after a period of improvement   Fever of100.4F (38.0C) or higher, or fever that doesn't go down with medicine   Sudden dizziness or confusion   Severe or continued vomiting   Signs of dehydration, including extreme thirst, dark urine, infrequent urination, dry mouth   Spotted, red, or very sore throat   Date Last Reviewed: 06/07/2015   2000-2018 [employer] [address], [place] [address]. All rights reserved. This information is not intended as a substitute for professional medical care. Always follow your healthcare professional's instructions.

## 2017-06-12 NOTE — Progress Notes (Signed)
Have you seen any specialists/other providers since your last visit with [place]?    No    Arm preference verified?   Yes    The patient is due for spirometry, influenza vaccine, shingles vaccine and pneumonia vaccine  Cough   This is a new problem. The current episode started in the past 7 days (4 days). The problem has been rapidly worsening. Associated symptoms include chills, headaches, myalgias, rhinorrhea and a sore throat. Pertinent negatives include no ear congestion, ear pain, fever, postnasal drip, rash, sweats or wheezing. She has tried nothing for the symptoms. The treatment provided no relief. Her past medical history is significant for environmental allergies. There is no history of bronchitis or pneumonia.       ROS:  Review of Systems   Constitutional: Positive for chills. Negative for fatigue and fever.   HENT: Positive for rhinorrhea and sore throat. Negative for congestion, ear pain, postnasal drip, sinus pressure, sneezing and trouble swallowing.    Respiratory: Positive for cough (dry cough). Negative for wheezing.    Musculoskeletal: Positive for myalgias.   Skin: Negative for rash.   Allergic/Immunologic: Positive for environmental allergies.   Neurological: Positive for headaches. Negative for dizziness.   Hematological: Negative for adenopathy.     The following sections were reviewed this encounter by the provider:   Tobacco  Allergies  Meds  Problems  Med Hx  Surg Hx  Fam Hx  Soc Hx             Objective:       Physical Exam:  Physical Exam   Constitutional: She is oriented to person, place, and time. Vital signs are normal. She appears well-developed and well-nourished. She is active.   HENT:   Head: Normocephalic and atraumatic.   Right Ear: Hearing, external ear and ear canal normal. No drainage, swelling or tenderness. Tympanic membrane is not injected, not scarred, not perforated and not erythematous. A middle ear effusion is present. No decreased hearing is noted.   Left Ear:  Hearing, external ear and ear canal normal. No drainage, swelling or tenderness. Tympanic membrane is not injected, not scarred, not perforated and not erythematous. A middle ear effusion is present. No decreased hearing is noted.   Nose: Mucosal edema present.   Mouth/Throat: Uvula is midline, oropharynx is clear and moist and mucous membranes are normal.   Eyes: Conjunctivae are normal.   Cardiovascular: Normal rate and regular rhythm.    Pulmonary/Chest: Effort normal and breath sounds normal. No respiratory distress. She has no wheezes. She has no rales. She exhibits no tenderness.   Neurological: She is alert and oriented to person, place, and time.   Skin: Skin is warm and dry.   Psychiatric: She has a normal mood and affect. Her behavior is normal. Judgment and thought content normal.   Nursing note and vitals reviewed.         Assessment/Plan:       1. URI with cough and congestion  - benzonatate (TESSALON) 200 MG capsule; Take 1 capsule (200 mg total) by mouth 3 (three) times daily as needed for Cough.  Dispense: 30 capsule; Refill: 0  - fluticasone (FLONASE) 50 MCG/ACT nasal spray; 2 sprays by Nasal route daily.  Dispense: 16 g; Refill: 1    2. Barrett's esophagus without dysplasia  - omeprazole (PRILOSEC) 20 MG capsule; Take 1 capsule (20 mg total) by mouth 2 (two) times daily.  Dispense: 60 capsule; Refill: 0      -It  is likely that your symptoms are viral cause. Utilize throat lozenges, warm salt water gargles, and chloraseptic spray as needed.    -Maintaining adequate hydration may help to thin secretions and soothe the respiratory mucosa. Increase your fluid intake with water or electrolyte beverages (gatorade, pedialyte).   -The ingestion of warm liquids (eg, apple juice, tea, chicken soup) may have a soothing effect on the respiratory mucosa, may increase the flow of nasal mucus, and may loosen respiratory secretions, thereby enhancing their removal.   -Saline nasal spray or nasal drops may be used  to cleanse the sinuses if desired. A humidfier or warm shower may help losen secretions.   -Practice good hand hygiene. Patient is agreeable to plan, has no further questions. -RTC if symptoms worsening such as SOB, increased sputum production and dark colored sputum,fatigue.    The differential diagnosis includes pharyngitis (Strep, viral, non-strep ), Mononucleosis, post-nasal drip, abscess ( peri-tonsillar, dental, pharangeal ), GERD, URI or sinusitis.      Hamiyet Berna Spare, NP

## 2017-07-15 ENCOUNTER — Encounter (INDEPENDENT_AMBULATORY_CARE_PROVIDER_SITE_OTHER): Payer: Self-pay

## 2017-07-21 ENCOUNTER — Encounter (INDEPENDENT_AMBULATORY_CARE_PROVIDER_SITE_OTHER): Payer: Self-pay

## 2017-08-15 ENCOUNTER — Encounter (INDEPENDENT_AMBULATORY_CARE_PROVIDER_SITE_OTHER): Payer: Self-pay

## 2017-08-19 ENCOUNTER — Telehealth (INDEPENDENT_AMBULATORY_CARE_PROVIDER_SITE_OTHER): Payer: Self-pay | Admitting: Internal Medicine

## 2017-08-19 NOTE — Telephone Encounter (Signed)
Name, strength, directions of requested refill(s): completely out    omeprazole (PRILOSEC) 20 MG capsule  Pharmacy to send refill to or patient to pick up rx from office (mark requested pharmacy in [place]):      [id number] - [place], [place] - [address] [facility].  [address] [place].  [place] [address]  Phone: [phone] Fax: [phone]        Please mark "X" next to the preferred call back number:    Mobile:   No relevant phone numbers on file.       Home: [phone]    Work:           Next visit: Visit date not found

## 2017-08-20 ENCOUNTER — Encounter (INDEPENDENT_AMBULATORY_CARE_PROVIDER_SITE_OTHER): Payer: Self-pay | Admitting: Family

## 2017-08-20 ENCOUNTER — Ambulatory Visit (INDEPENDENT_AMBULATORY_CARE_PROVIDER_SITE_OTHER): Payer: Medicare Other | Admitting: Family

## 2017-08-20 VITALS — BP 134/67 | HR 70 | Temp 98.0°F | Wt 119.2 lb

## 2017-08-20 DIAGNOSIS — R079 Chest pain, unspecified: Secondary | ICD-10-CM

## 2017-08-20 DIAGNOSIS — F418 Other specified anxiety disorders: Secondary | ICD-10-CM

## 2017-08-20 DIAGNOSIS — R072 Precordial pain: Secondary | ICD-10-CM

## 2017-08-20 DIAGNOSIS — K2270 Barrett's esophagus without dysplasia: Secondary | ICD-10-CM

## 2017-08-20 DIAGNOSIS — F419 Anxiety disorder, unspecified: Secondary | ICD-10-CM

## 2017-08-20 MED ORDER — ESCITALOPRAM OXALATE 5 MG PO TABS
5.0000 mg | ORAL_TABLET | Freq: Every day | ORAL | 0 refills | Status: DC
Start: 2017-08-20 — End: 2018-04-21

## 2017-08-20 MED ORDER — OMEPRAZOLE 20 MG PO CPDR
20.0000 mg | DELAYED_RELEASE_CAPSULE | Freq: Two times a day (BID) | ORAL | 0 refills | Status: DC
Start: 2017-08-20 — End: 2017-10-26

## 2017-08-20 NOTE — Telephone Encounter (Signed)
Rx has been filled today at appt in ofc by NP, Hamiyet. No further action req

## 2017-08-20 NOTE — Progress Notes (Signed)
Have you seen any specialists/other providers since your last visit with us?    No    Arm preference verified?   Yes    The patient is due for spirometry, shingles vaccine and pneumonia vaccine

## 2017-08-20 NOTE — Progress Notes (Signed)
Michele [name] is a 77 y.o. female  Is here for chest pain which feels like pinching of the skin on/off            Chest Pain    This is a recurrent problem. The current episode started more than 1 year ago. The problem occurs intermittently. The pain is present in the substernal region. Pertinent negatives include no abdominal pain, back pain, claudication, cough, dizziness, exertional chest pressure, fever, headaches, hemoptysis, irregular heartbeat, leg pain, lower extremity edema, malaise/fatigue, nausea, near-syncope, numbness, orthopnea, palpitations, PND, shortness of breath, sputum production, syncope, vomiting or weakness.   Pertinent negatives for past medical history include no seizures.       ROS:  Review of Systems   Constitutional: Negative for chills, fatigue, fever and malaise/fatigue.   HENT: Negative for congestion and dental problem.    Respiratory: Negative for cough, hemoptysis, sputum production, chest tightness, shortness of breath and wheezing.    Cardiovascular: Positive for chest pain. Negative for palpitations, orthopnea, claudication, leg swelling, syncope, PND and near-syncope.   Gastrointestinal: Negative for abdominal distention, abdominal pain, diarrhea, nausea and vomiting.   Musculoskeletal: Negative for arthralgias and back pain.   Neurological: Negative for dizziness, seizures, speech difficulty, weakness, numbness and headaches.   Psychiatric/Behavioral: Negative for agitation, behavioral problems, confusion, decreased concentration, dysphoric mood, hallucinations, self-injury, sleep disturbance and suicidal ideas. The patient is nervous/anxious. The patient is not hyperactive.         Husband passed away 6 months ago   The following sections were reviewed this encounter by the provider:   Tobacco  Allergies  Meds  Problems  Med Hx  Surg Hx  Fam Hx  Soc Hx               Objective:     Vitals:    08/20/17 1311   BP: 134/67   Pulse: 70   Temp: 98 F (36.7 C)       Physical  Exam:  Physical Exam   Constitutional: She is oriented to person, place, and time. She appears well-developed and well-nourished.   Cardiovascular: Normal rate and regular rhythm.    Neurological: She is alert and oriented to person, place, and time.   Skin: Skin is warm and dry.   Psychiatric: Her behavior is normal. Judgment and thought content normal. Her mood appears anxious. Her speech is rapid and/or pressured.   Nursing note and vitals reviewed.         Assessment/Plan:       1. Chest pain, unspecified type  - ECG 12 lead  - Ambulatory referral to Cardiology    2. Anxiety about health  - escitalopram (LEXAPRO) 5 MG tablet; Take 1 tablet (5 mg total) by mouth daily.  Dispense: 30 tablet; Refill: 0    3. Barrett's esophagus without dysplasia  - omeprazole (PRILOSEC) 20 MG capsule; Take 1 capsule (20 mg total) by mouth 2 (two) times daily.  Dispense: 60 capsule; Refill: 0      -similar EKG compared to last from 01/14/18  -possible anxiety related CP.  -D/t repeated occurrences of CP, pt is advised to FU with cardiology. She has seen Dr. Carlota Raspberry in the past.  -will try Lexapro 5mg  for anxiety  -Prilosec refill as requested.  -FU in one month  -all questions are answered, pt agrees to plan.      Philana Younis Berna Spare, NP

## 2017-09-01 ENCOUNTER — Telehealth (INDEPENDENT_AMBULATORY_CARE_PROVIDER_SITE_OTHER): Payer: Self-pay | Admitting: Internal Medicine

## 2017-09-01 ENCOUNTER — Encounter (INDEPENDENT_AMBULATORY_CARE_PROVIDER_SITE_OTHER): Payer: Self-pay | Admitting: Family

## 2017-09-01 ENCOUNTER — Ambulatory Visit (INDEPENDENT_AMBULATORY_CARE_PROVIDER_SITE_OTHER): Payer: Medicare Other | Admitting: Family

## 2017-09-01 VITALS — BP 108/56 | HR 66 | Temp 98.3°F | Resp 16 | Ht 58.86 in | Wt 117.4 lb

## 2017-09-01 DIAGNOSIS — N6452 Nipple discharge: Secondary | ICD-10-CM

## 2017-09-01 NOTE — Progress Notes (Signed)
Have you seen any specialists/other providers since your last visit with us?    No    Arm preference verified?   Yes    The patient is due for spirometry, shingles vaccine and pneumonia vaccine

## 2017-09-01 NOTE — Addendum Note (Signed)
Addended by: Eliezer Lofts. on: 09/01/2017 02:17 PM     Modules accepted: Orders

## 2017-09-01 NOTE — Telephone Encounter (Signed)
Order changed as requested.

## 2017-09-01 NOTE — Telephone Encounter (Signed)
Radiology Dept called and they would like an order for Diagnostic Bialateral Mammo 3D to be placed. Once placed they will see it in Epic.

## 2017-09-01 NOTE — Progress Notes (Signed)
Michele [name] is a 77 y.o. female  Is c/o R nipple discharge which occur in only one occaion.          ROS:  Review of Systems   Constitutional: Negative for chills, fatigue and fever.   Eyes: Negative for discharge and itching.   Respiratory: Negative for cough, chest tightness and shortness of breath.    Cardiovascular: Negative for chest pain and palpitations.   Gastrointestinal: Negative for nausea and vomiting.   Musculoskeletal: Negative for arthralgias.   Skin: Negative for color change, pallor, rash and wound.   Allergic/Immunologic: Negative for environmental allergies, food allergies and immunocompromised state.   Hematological: Negative for adenopathy.     The following sections were reviewed this encounter by the provider:   Tobacco  Allergies  Meds  Problems  Med Hx  Surg Hx  Fam Hx  Soc Hx             Objective:   Blood pressure 108/56, pulse 66, temperature 98.3 F (36.8 C), temperature source Oral, resp. rate 16, height 1.495 m (4' 10.86"), weight 53.3 kg (117 lb 6.4 oz).        Physical Exam:  Physical Exam   Constitutional: She is oriented to person, place, and time. She appears well-developed and well-nourished.   HENT:   Head: Normocephalic and atraumatic.   Cardiovascular: Normal rate and regular rhythm.    Pulmonary/Chest: Effort normal and breath sounds normal. Right breast exhibits nipple discharge (nor present during exam, reported by pt) and tenderness. Right breast exhibits no inverted nipple, no mass and no skin change. Left breast exhibits no inverted nipple, no mass, no nipple discharge, no skin change and no tenderness.       Neurological: She is alert and oriented to person, place, and time.   Skin: Skin is warm and dry.   Psychiatric: She has a normal mood and affect. Her behavior is normal. Judgment and thought content normal.   Nursing note and vitals reviewed.         Assessment/Plan:       There are no diagnoses linked to this encounter.        Asharia Lotter Berna Spare, NP

## 2017-09-02 ENCOUNTER — Ambulatory Visit: Payer: Medicare Other

## 2017-09-03 ENCOUNTER — Ambulatory Visit
Admission: RE | Admit: 2017-09-03 | Discharge: 2017-09-03 | Disposition: A | Discharge status: Home / Self Care | Payer: Medicare Other | Source: Ambulatory Visit | Attending: Family | Admitting: Family

## 2017-09-03 ENCOUNTER — Other Ambulatory Visit (INDEPENDENT_AMBULATORY_CARE_PROVIDER_SITE_OTHER): Payer: Self-pay | Admitting: Family

## 2017-09-03 DIAGNOSIS — N6452 Nipple discharge: Secondary | ICD-10-CM

## 2017-09-03 DIAGNOSIS — N6001 Solitary cyst of right breast: Secondary | ICD-10-CM | POA: Insufficient documentation

## 2017-09-12 ENCOUNTER — Encounter (INDEPENDENT_AMBULATORY_CARE_PROVIDER_SITE_OTHER): Payer: Self-pay

## 2017-10-23 ENCOUNTER — Telehealth (INDEPENDENT_AMBULATORY_CARE_PROVIDER_SITE_OTHER): Payer: Self-pay | Admitting: Internal Medicine

## 2017-10-23 NOTE — Telephone Encounter (Signed)
Pt calls requesting for medication refill. Pt has one tablet left.     Name, strength, directions of requested refill(s):  omeprazole (PRILOSEC) 20 MG capsule    Pharmacy to send refill to or patient to pick up rx from office (mark requested pharmacy in [place]):      [id number] - [place], [place] - [address] [facility].  [address] [place].  [place] [address]  Phone: [phone] Fax: [phone]        Please mark "X" next to the preferred call back number:    Mobile:   No relevant phone numbers on file.           Home: [phone] x   Work:           Next visit: Visit date not found

## 2017-10-26 ENCOUNTER — Other Ambulatory Visit (INDEPENDENT_AMBULATORY_CARE_PROVIDER_SITE_OTHER): Payer: Self-pay | Admitting: Internal Medicine

## 2017-10-26 DIAGNOSIS — K2270 Barrett's esophagus without dysplasia: Secondary | ICD-10-CM

## 2017-10-26 NOTE — Telephone Encounter (Signed)
Refill request sent to PCP for review.

## 2017-10-27 MED ORDER — OMEPRAZOLE 20 MG PO CPDR
20.0000 mg | DELAYED_RELEASE_CAPSULE | Freq: Two times a day (BID) | ORAL | 0 refills | Status: DC
Start: 2017-10-27 — End: 2018-01-05

## 2017-12-28 ENCOUNTER — Telehealth (INDEPENDENT_AMBULATORY_CARE_PROVIDER_SITE_OTHER): Payer: Self-pay | Admitting: Internal Medicine

## 2017-12-28 NOTE — Telephone Encounter (Signed)
Patient is experiencing some chest discomfort - no chest pain and no SOB.     The next OV appt opening is on 01/26/18 - patient wants to know if Dr. Leeroy Bock can have her come in sooner.     Please call PT at [phone] ASAP. Thank you!

## 2017-12-29 ENCOUNTER — Other Ambulatory Visit (INDEPENDENT_AMBULATORY_CARE_PROVIDER_SITE_OTHER): Payer: Self-pay | Admitting: Internal Medicine

## 2017-12-29 DIAGNOSIS — K2270 Barrett's esophagus without dysplasia: Secondary | ICD-10-CM

## 2017-12-29 NOTE — Telephone Encounter (Signed)
Name, strength, directions of requested refill(s):    -omeprazole (PRILOSEC) 20 MG capsule      Pharmacy to send refill to or patient to pick up rx from office (mark requested pharmacy in BOLD):      [id number] - [place], [place] - [address]  [address] [facility] [facility] [address] [facility]  [place] [address]  Phone: [phone] Fax: [phone]        Please mark "X" next to the preferred call back number:    Mobile:   No relevant phone numbers on file.       Home: [phone]    Work:       Patient requesting refill, she out of medication, she would like a 3 month supply    Next visit: Visit date not found

## 2017-12-29 NOTE — Telephone Encounter (Signed)
I attempted to call pt can did not receive an another. Left detailed message to please return phone call for symptoms.

## 2018-01-01 ENCOUNTER — Telehealth (INDEPENDENT_AMBULATORY_CARE_PROVIDER_SITE_OTHER): Payer: Self-pay | Admitting: Internal Medicine

## 2018-01-01 NOTE — Telephone Encounter (Signed)
Pt will like to know if she will get her prescription for omeprazole (PRILOSEC) 20 MG capsule she called in on Tuesday for refill  and she went to the pharmacy 12/31/17 and  the script have not been sent.        Pt can be reached at   [phone]

## 2018-01-05 ENCOUNTER — Other Ambulatory Visit (INDEPENDENT_AMBULATORY_CARE_PROVIDER_SITE_OTHER): Payer: Self-pay | Admitting: Internal Medicine

## 2018-01-05 DIAGNOSIS — K2270 Barrett's esophagus without dysplasia: Secondary | ICD-10-CM

## 2018-01-05 MED ORDER — OMEPRAZOLE 20 MG PO CPDR
20.0000 mg | DELAYED_RELEASE_CAPSULE | Freq: Two times a day (BID) | ORAL | 0 refills | Status: DC
Start: 2018-01-05 — End: 2018-01-28

## 2018-01-05 NOTE — Telephone Encounter (Signed)
Refill request sent to PCP.

## 2018-01-11 ENCOUNTER — Encounter (INDEPENDENT_AMBULATORY_CARE_PROVIDER_SITE_OTHER): Payer: Self-pay | Admitting: Internal Medicine

## 2018-01-11 ENCOUNTER — Ambulatory Visit
Admission: RE | Admit: 2018-01-11 | Discharge: 2018-01-11 | Disposition: A | Discharge status: Home / Self Care | Payer: Medicare Other | Source: Ambulatory Visit | Attending: Internal Medicine | Admitting: Internal Medicine

## 2018-01-11 ENCOUNTER — Ambulatory Visit (INDEPENDENT_AMBULATORY_CARE_PROVIDER_SITE_OTHER): Payer: Medicare Other | Admitting: Internal Medicine

## 2018-01-11 VITALS — BP 96/52 | HR 69 | Temp 98.6°F | Ht 58.86 in | Wt 114.0 lb

## 2018-01-11 DIAGNOSIS — Z01818 Encounter for other preprocedural examination: Secondary | ICD-10-CM

## 2018-01-11 DIAGNOSIS — D693 Immune thrombocytopenic purpura: Secondary | ICD-10-CM

## 2018-01-11 DIAGNOSIS — Z720 Tobacco use: Secondary | ICD-10-CM

## 2018-01-11 DIAGNOSIS — J449 Chronic obstructive pulmonary disease, unspecified: Secondary | ICD-10-CM

## 2018-01-11 DIAGNOSIS — F17200 Nicotine dependence, unspecified, uncomplicated: Secondary | ICD-10-CM

## 2018-01-11 DIAGNOSIS — K2270 Barrett's esophagus without dysplasia: Secondary | ICD-10-CM

## 2018-01-11 DIAGNOSIS — R251 Tremor, unspecified: Secondary | ICD-10-CM

## 2018-01-11 LAB — CBC
Absolute NRBC: 0 10*3/uL (ref 0.00–0.00)
Hematocrit: 38.4 % (ref 34.7–43.7)
Hgb: 12.3 g/dL (ref 11.4–14.8)
MCH: 30.1 pg (ref 25.1–33.5)
MCHC: 32 g/dL (ref 31.5–35.8)
MCV: 93.9 fL (ref 78.0–96.0)
MPV: 11.8 fL (ref 8.9–12.5)
Nucleated RBC: 0 /100 WBC (ref 0.0–0.0)
Platelets: 135 10*3/uL — ABNORMAL LOW (ref 142–346)
RBC: 4.09 10*6/uL (ref 3.90–5.10)
RDW: 12 % (ref 11–15)
WBC: 3.35 10*3/uL (ref 3.10–9.50)

## 2018-01-11 LAB — BASIC METABOLIC PANEL
BUN: 17 mg/dL (ref 7.0–19.0)
CO2: 28 mEq/L (ref 21–29)
Calcium: 9.5 mg/dL (ref 7.9–10.2)
Chloride: 108 mEq/L (ref 100–111)
Creatinine: 0.7 mg/dL (ref 0.4–1.5)
Glucose: 81 mg/dL (ref 70–100)
Potassium: 4.7 mEq/L (ref 3.5–5.1)
Sodium: 141 mEq/L (ref 136–145)

## 2018-01-11 LAB — HEMOLYSIS INDEX: Hemolysis Index: 11 (ref 0–18)

## 2018-01-11 LAB — GFR: EGFR: >60

## 2018-01-11 NOTE — Progress Notes (Signed)
Have you seen any specialists/other providers since your last visit with [place]?    Yes- Dr. Tarry Kos    Arm preference verified?   Yes    The patient is due for spirometry, shingles vaccine, pneumonia vaccine and AWV

## 2018-01-11 NOTE — Progress Notes (Signed)
Subjective :   Michele [name] is a 77 y.o. female  Presents with her Daughter  today   Chief Complaint   Patient presents with   . Pre-operative exam     Cataract surgery on 01/25/18      Here today for consultation for pre op exam at the request of her ophthalmologist  Dr. Tarry Kos  .   Plan for right cataract surgery on 01/25/18    Her medical problems include;   Problem   Tobacco Abuse    0.5ppd x 50 years . Quit for 6 months but restarted again now. Smoking 6 cig/ day .      Copd (Chronic Obstructive Pulmonary Disease)    Stable. Was On symbicort daily and albuterol inhaler prn. . Reports she has not been taking her inhaler and has been doing good. deneis any SOB or wheezing or chest tightness.     11/12/15;  Spirometry - suggestive of moderate to severe Obstructive disease. FEv1- 52 %.   Chronic h/o smoking- 50 yrs. 7-11 cig/ day   CXR- normal.     Idiopathic Thrombocytopenia Purpura    Chronic. Stable.   No h/o abnormal bleeding episodes.      Tremor    Chronic. Followed with neurologist Dr. Roque Cash.      Barrett Esophagus    Stable. On prilosec 20mg  once daily.   Follow up by Dr Gwenlyn Perking ... .         Denies any h/o HTN/DM/ heart disease/ stroke.   No h/o bleeding or clotting problems.   Denies any complications with surgery or anesthesia in the past.   Denies any chest pain, SOB, palpitations, diaphoresis on rest or exertion. Denies any dizziness or syncope.   She had an episode of chest pain in 2/19- EKG done was normal. Reports no further episodes of chest pain since then. She had  followed with cardiology in 2018 and stress test and echo done were norma.     The following portions of the patient's history were reviewed and updated as appropriate: allergies, current medications, past family history, past medical history, past social history, past surgical history and problem list.   Family History   Problem Relation Age of Onset   . Breast cancer Neg Hx    . Ovarian cancer Neg Hx    . Cancer Neg Hx      Past  Medical History:   Diagnosis Date   . Abnormal EKG    . Arthralgia    . Barrett's esophagus    . Cervical spinal stenosis    . Colonic polyp    . Depression    . Disc disorder    . Gastroesophageal reflux disease    . Lower back pain    . Numbness and tingling of both legs 02/06/2015   . Osteoarthritis    . Osteopenia      Past Surgical History:   Procedure Laterality Date   . APPENDECTOMY  1960?    when was a teenager   . TUBAL LIGATION  1973     No Known Allergies  Current Outpatient Prescriptions on File Prior to Visit   Medication Sig Dispense Refill   . aspirin EC 81 MG EC tablet Take by mouth.     . Calcium Carbonate (CALCIUM 600 PO) Take 1 tablet by mouth daily.         Marland Kitchen omeprazole (PRILOSEC) 20 MG capsule Take 1 capsule (20 mg total) by mouth 2 (two) times daily  60 capsule 0   . escitalopram (LEXAPRO) 5 MG tablet Take 1 tablet (5 mg total) by mouth daily. 30 tablet 0   . fluticasone (FLONASE) 50 MCG/ACT nasal spray 2 sprays by Nasal route daily. 16 g 1   . loratadine (CLARITIN) 10 MG tablet Take 1 tablet (10 mg total) by mouth daily. 30 tablet 0     No current facility-administered medications on file prior to visit.      Social History     Social History   . Marital status: Married     Spouse name: N/A   . Number of children: N/A   . Years of education: N/A     Occupational History   . Not on file.     Social History Main Topics   . Smoking status: Former Smoker     Packs/day: 0.50     Years: 50.00     Types: Cigarettes     Quit date: 01/10/2017   . Smokeless tobacco: Never Used   . Alcohol use No   . Drug use: No   . Sexual activity: No     Other Topics Concern   . Not on file     Social History Narrative   . No narrative on file     ROS :   Constitutional: Negative for fever, chills and diaphoresis.   Respiratory: Negative for chest tightness, shortness of breath and wheezing.   Cardiovascular: Negative for chest pain, palpitations and leg swelling.   Gastrointestinal: Negative for abdominal pain, vomiting,  blood in stool .   GU: Negative for dysuria, hematuria,   Musculoskeletal: Negative for arthralgias and gait problem.   Neurological: Negative for seizures and headaches.   Hematological: Negative for bleeding or clotting problems   Psychiatric/Behavioral: Negative     Objective:   Vitals reviewed: Blood pressure 96/52, pulse 69, temperature 98.6 F (37 C), temperature source Oral, height 1.495 m (4' 10.86"), weight 51.7 kg (114 lb).    Constitutional: Pt. Well developed and well nourished  HENT:   Head: Normocephalic and atraumatic.   Neck: Normal range of motion. Neck supple.   Cardiovascular: Normal rate and regular rhythm. No murmur heard.   Pulmonary/Chest: Effort normal and Clear breath sounds on both sides. .   Abdominal: Soft, not distended, not tender, Bowel sounds are normal.   Musculoskeletal: Normal range of motion.   Neurological: alert and oriented to person, place, and time. Intact cranial nerves. Normal strength and tone. Reflexes normal . Gait normal. Speech normal.   Psych; normal mood and affect      Assessment and Plan:     1. Preop examination  MRSA culture    CANCELED: CBC w/o differential    CANCELED: Basic metabolic panel   2. Barrett's esophagus without dysplasia     3. Chronic obstructive pulmonary disease, unspecified COPD type     4. Tobacco abuse     5. Tremor     6. Idiopathic thrombocytopenia purpura       Preop;     EKG in 08/20/17-  NSR. No ST -T wave changes.   H/o COPD: stable without medication .   Tobacco abuse; counseled on smoking cessation  Baseline BP - low Normal. Asymptomatic. Advised adequate hydration.   Barretts ; stable on omeprazole.   Idiopathic thrombocytopenia; chronic. Mild . Stable     Labs ordered as above.   Patient medically cleared for surgery . Pending lab results.

## 2018-01-12 ENCOUNTER — Encounter (INDEPENDENT_AMBULATORY_CARE_PROVIDER_SITE_OTHER): Payer: Self-pay | Admitting: Internal Medicine

## 2018-01-12 LAB — MRSA CULTURE
Culture MRSA Surveillance: NEGATIVE
Culture MRSA Surveillance: NEGATIVE

## 2018-01-28 ENCOUNTER — Other Ambulatory Visit (INDEPENDENT_AMBULATORY_CARE_PROVIDER_SITE_OTHER): Payer: Self-pay | Admitting: Internal Medicine

## 2018-01-28 DIAGNOSIS — K2270 Barrett's esophagus without dysplasia: Secondary | ICD-10-CM

## 2018-02-16 NOTE — Telephone Encounter (Signed)
Refill sent.

## 2018-02-23 ENCOUNTER — Other Ambulatory Visit (INDEPENDENT_AMBULATORY_CARE_PROVIDER_SITE_OTHER): Payer: Self-pay | Admitting: Internal Medicine

## 2018-02-23 DIAGNOSIS — K2270 Barrett's esophagus without dysplasia: Secondary | ICD-10-CM

## 2018-03-18 ENCOUNTER — Other Ambulatory Visit (INDEPENDENT_AMBULATORY_CARE_PROVIDER_SITE_OTHER): Payer: Self-pay | Admitting: Internal Medicine

## 2018-03-18 DIAGNOSIS — K2270 Barrett's esophagus without dysplasia: Secondary | ICD-10-CM

## 2018-03-18 NOTE — Telephone Encounter (Signed)
Forwarding request to PCP for review

## 2018-03-18 NOTE — Telephone Encounter (Signed)
Name, strength, directions of requested refill(s):  omeprazole (PRILOSEC) 20 MG capsule    Pharmacy to send refill to or patient to pick up rx from office (mark requested pharmacy in BOLD):      [id number] - [place], [place] - [address]  [address] [facility]  [place] [address]  Phone: [phone] Fax: [phone]        Please mark "X" next to the preferred call back number:    Mobile:   No relevant phone numbers on file.       Home: [phone] X   Work:           Next visit: 04/21/2018

## 2018-03-19 MED ORDER — OMEPRAZOLE 20 MG PO CPDR
20.00 mg | DELAYED_RELEASE_CAPSULE | Freq: Two times a day (BID) | ORAL | 0 refills | Status: DC
Start: 2018-03-19 — End: 2018-04-21

## 2018-03-24 ENCOUNTER — Other Ambulatory Visit (INDEPENDENT_AMBULATORY_CARE_PROVIDER_SITE_OTHER): Payer: Self-pay | Admitting: Internal Medicine

## 2018-03-24 DIAGNOSIS — K2270 Barrett's esophagus without dysplasia: Secondary | ICD-10-CM

## 2018-04-21 ENCOUNTER — Ambulatory Visit (INDEPENDENT_AMBULATORY_CARE_PROVIDER_SITE_OTHER): Payer: Medicare Other | Admitting: Internal Medicine

## 2018-04-21 ENCOUNTER — Other Ambulatory Visit (INDEPENDENT_AMBULATORY_CARE_PROVIDER_SITE_OTHER): Payer: Self-pay | Admitting: Internal Medicine

## 2018-04-21 ENCOUNTER — Encounter (INDEPENDENT_AMBULATORY_CARE_PROVIDER_SITE_OTHER): Payer: Self-pay | Admitting: Internal Medicine

## 2018-04-21 VITALS — BP 111/63 | HR 84 | Temp 98.5°F | Ht 58.86 in | Wt 114.0 lb

## 2018-04-21 DIAGNOSIS — R251 Tremor, unspecified: Secondary | ICD-10-CM

## 2018-04-21 DIAGNOSIS — Z23 Encounter for immunization: Secondary | ICD-10-CM

## 2018-04-21 DIAGNOSIS — Z131 Encounter for screening for diabetes mellitus: Secondary | ICD-10-CM

## 2018-04-21 DIAGNOSIS — Z13220 Encounter for screening for lipoid disorders: Secondary | ICD-10-CM

## 2018-04-21 DIAGNOSIS — D693 Immune thrombocytopenic purpura: Secondary | ICD-10-CM

## 2018-04-21 DIAGNOSIS — Z720 Tobacco use: Secondary | ICD-10-CM

## 2018-04-21 DIAGNOSIS — K2270 Barrett's esophagus without dysplasia: Secondary | ICD-10-CM

## 2018-04-21 DIAGNOSIS — Z0000 Encounter for general adult medical examination without abnormal findings: Secondary | ICD-10-CM

## 2018-04-21 DIAGNOSIS — M8580 Other specified disorders of bone density and structure, unspecified site: Secondary | ICD-10-CM

## 2018-04-21 DIAGNOSIS — J449 Chronic obstructive pulmonary disease, unspecified: Secondary | ICD-10-CM

## 2018-04-21 NOTE — Progress Notes (Signed)
Michele [name] is a 77 y.o. female who presents today for a Medicare Annual Wellness Visit.     Health Risk Assessment     During the past month, how would you rate your general health?:  Fair  Which of the following tasks can you do without assistance - drive or take the bus alone; shop for groceries or clothes; prepare your own meals; do your own housework/laundry; handle your own finances/pay bills; eat, bathe or get around your home?:  None of these  Which of the following problems have you been bothered by in the past month - dizzy when standing up; problems using the phone; feeling tired or fatigued; moderate or severe body pain?: Moderate or severe body pain  Do you exercise for about 20 minutes 3 or more days per week?:  Yes  During the past month was someone available to help if you needed and wanted help?  For example, if you felt nervous, lonely, got sick and had to stay in bed, needed someone to talk to, needed help with daily chores or needed help just taking care of yourself.: No  Do you always wear a seat belt?: Yes  Do you have any trouble taking medications the way you have been told to take them?: No  Have you been given any information that can help you with keeping track of your medications?: No  Do you have trouble paying for your medications?: No  Have you been given any information that can help you with hazards in your house, such as scatter rugs, furniture, etc?: Yes  Do you feel unsteady when standing or walking?: Yes  Do you worry about falling?: Yes  Have you fallen two or more times in the past year?: No  Did you suffer any injuries from your falls in the past year?: Yes    Additional Concerns    Patient Care Team:  Jeraldine Loots, MD as PCP - General (Family Medicine)  Rosealee Albee, MD as Consulting Physician (Cardiology)  Janice Norrie, MD as Consulting Physician (Gastroenterology)  Laurie Panda, MD as Consulting Physician (Neurology)    Past Medical History:    Diagnosis Date   . Abnormal EKG    . Arthralgia    . Barrett's esophagus    . Cervical spinal stenosis    . Colonic polyp    . Depression    . Disc disorder    . Gastroesophageal reflux disease    . Lower back pain    . Numbness and tingling of both legs 02/06/2015   . Osteoarthritis    . Osteopenia      Past Surgical History:   Procedure Laterality Date   . APPENDECTOMY  1960?    when was a teenager   . TUBAL LIGATION  1973     No Known Allergies   Current Outpatient Medications   Medication Sig Dispense Refill   . aspirin EC 81 MG EC tablet Take by mouth.     . Calcium Carbonate (CALCIUM 600 PO) Take 1 tablet by mouth daily.         Marland Kitchen VITAMIN E PO Take by mouth daily     . omeprazole (PRILOSEC) 20 MG capsule TAKE 1 CAPSULE BY MOUTH TWICE A DAY 60 capsule 0     No current facility-administered medications for this visit.       Social History     Tobacco Use   . Smoking status: Former Smoker     Packs/day: 0.50  Years: 50.00     Pack years: 25.00     Types: Cigarettes     Last attempt to quit: 01/10/2017     Years since quitting: 1.2   . Smokeless tobacco: Never Used   Substance Use Topics   . Alcohol use: No   . Drug use: No      Family History   Problem Relation Age of Onset   . Breast cancer Neg Hx    . Ovarian cancer Neg Hx    . Cancer Neg Hx         The following sections were reviewed this encounter by the provider:   Tobacco  Allergies  Meds  Problems  Med Hx  Surg Hx  Fam Hx         Hospitalizations  no hospitalizations within past year    Depression Screening    See related Activity or Flowsheet    Functional Ability    Falls Risk:  home does not have throw rugs, poor lighting or a slippery bath tub or shower  Hearing:  hearing within normal limits  Exercise:  Moderate ( i.e. brisk walking )  ADL's:   Bathing - independent   Dressing - independent   Mobility - independent   Transfer - independent   Eating - independent}   Toileting - independent   ADL assistance not needed    Discussion of Advance  Directives: Not addressed today.     Assessment    BP 111/63   Pulse 84   Temp 98.5 F (36.9 C) (Oral)   Ht 1.495 m (4' 10.86")   Wt 51.7 kg (114 lb)   BMI 23.13 kg/m      Vision Screening (required for IPPE only): Patient states eye exam performed elsewhere within past 12 months  Screening EKG (IPPE only): not indicated    ROS :   Constitutional: Negative for fever, chills and diaphoresis.   HENT: Negative for ear discharge, ear pain and hearing loss.   Eyes: Negative for photophobia and redness.   Respiratory: Negative for chest tightness, shortness of breath and wheezing.   Cardiovascular: Negative for chest pain, palpitations and leg swelling.   Gastrointestinal: Negative for abdominal pain, vomiting, blood in stool .   Musculoskeletal: Negative for arthralgias and gait problem.   Neurological: Negative for seizures and headaches.   Hematological: Negative for bleeding or clotting problems   Psychiatric/Behavioral: Negative    Phy;   Constitutional: She is oriented to person, place, and time.   HENT:   Head: Normocephalic and atraumatic.   Mouth/Throat: No oropharyngeal exudate.   Eyes: Conjunctivae normal are normal. Pupils are equal, round, and reactive to light.    Neck: Normal range of motion. Neck supple. No thyromegaly present.   Cardiovascular: Normal rate and regular rhythm. No murmur heard.   Pulmonary/Chest: Effort normal and Clear breath sounds on both sides. .   Abdominal: Soft, not distended, not tender, Bowel sounds are normal.   Musculoskeletal: Normal range of motion.   Neurological: She is alert and oriented to person, place, and time.   Skin: Normal color, warm and dry    Evaluation of Cognitive Function    Mood/affect: Appropriate  Appearance: neatly groomed, appropriately and adequately nourished  Family member/caregiver input: Not present      Cognitive screen: she had just completed 3 rd grade elementary. She could not finish the questionnaire  She denies any memory problems. .      Assessment/Plan  1. Medicare annual wellness visit, subsequent  - Comprehensive metabolic panel; Future  - Lipid panel; Future  - Dxa Bone Density Axial Skeleton; Future  - Pneumococcal polysaccharide vaccine 23-valent greater than or equal to 2yo subcutaneous/IM    2. Chronic obstructive pulmonary disease, unspecified COPD type    3. Barrett's esophagus without dysplasia    4. Osteopenia, unspecified location  - Dxa Bone Density Axial Skeleton; Future    5. Idiopathic thrombocytopenia purpura    6. Tobacco abuse    7. Tremor    8. Lipid screening  - Lipid panel; Future    9. Screening for diabetes mellitus  - Comprehensive metabolic panel; Future    10. Need for pneumococcal vaccination  - not done today = PCV -23 not available in office today. Will plan on next visit.   - Pneumococcal polysaccharide vaccine 23-valent greater than or equal to 2yo subcutaneous/IM      High BMI Follow Up   BMI Follow Up Care Plan Documented   Encouragement to Exercise    Jeraldine Loots, MD  04/22/2018

## 2018-04-22 ENCOUNTER — Ambulatory Visit: Payer: Medicare Other

## 2018-04-22 ENCOUNTER — Other Ambulatory Visit (FREE_STANDING_LABORATORY_FACILITY): Payer: Medicare Other

## 2018-04-22 ENCOUNTER — Ambulatory Visit (INDEPENDENT_AMBULATORY_CARE_PROVIDER_SITE_OTHER): Payer: Medicare Other

## 2018-04-22 DIAGNOSIS — Z13220 Encounter for screening for lipoid disorders: Secondary | ICD-10-CM

## 2018-04-22 DIAGNOSIS — Z0000 Encounter for general adult medical examination without abnormal findings: Secondary | ICD-10-CM

## 2018-04-22 DIAGNOSIS — Z131 Encounter for screening for diabetes mellitus: Secondary | ICD-10-CM

## 2018-04-22 LAB — HEMOLYSIS INDEX: Hemolysis Index: 6 (ref 0–18)

## 2018-04-22 LAB — LIPID PANEL
Cholesterol / HDL Ratio: 3.4
Cholesterol: 209 mg/dL — ABNORMAL HIGH (ref 0–199)
HDL: 61 mg/dL (ref 40–9999)
LDL Calculated: 133 mg/dL — ABNORMAL HIGH (ref 0–99)
Triglycerides: 77 mg/dL (ref 34–149)
VLDL Calculated: 15 mg/dL (ref 10–40)

## 2018-04-22 LAB — COMPREHENSIVE METABOLIC PANEL
ALT: 11 U/L (ref 0–55)
AST (SGOT): 20 U/L (ref 5–34)
Albumin/Globulin Ratio: 1.5 (ref 0.9–2.2)
Albumin: 4 g/dL (ref 3.5–5.0)
Alkaline Phosphatase: 75 U/L (ref 37–106)
BUN: 12 mg/dL (ref 7.0–19.0)
Bilirubin, Total: 0.7 mg/dL (ref 0.2–1.2)
CO2: 30 mEq/L — ABNORMAL HIGH (ref 21–29)
Calcium: 9.3 mg/dL (ref 7.9–10.2)
Chloride: 104 mEq/L (ref 100–111)
Creatinine: 0.7 mg/dL (ref 0.4–1.5)
Globulin: 2.7 g/dL (ref 2.0–3.7)
Glucose: 87 mg/dL (ref 70–100)
Potassium: 4.2 mEq/L (ref 3.5–5.1)
Protein, Total: 6.7 g/dL (ref 6.0–8.3)
Sodium: 142 mEq/L (ref 136–145)

## 2018-04-22 LAB — GFR: EGFR: >60

## 2018-04-26 ENCOUNTER — Ambulatory Visit: Payer: Medicare Other

## 2018-04-28 DIAGNOSIS — E785 Hyperlipidemia, unspecified: Secondary | ICD-10-CM | POA: Insufficient documentation

## 2018-04-29 ENCOUNTER — Telehealth (INDEPENDENT_AMBULATORY_CARE_PROVIDER_SITE_OTHER): Payer: Self-pay

## 2018-04-29 DIAGNOSIS — E785 Hyperlipidemia, unspecified: Secondary | ICD-10-CM

## 2018-04-29 NOTE — Telephone Encounter (Signed)
Patient just called back in regards to her lab results from 04/22/18. Writer read and discussed certain lab results and what they meant as well as reviewed the provider's note and recommendations. Patient is agreeable to starting on a cholesterol lowering medication.  Please send prescription to patient's preferred pharmacy listed in her chart when able to ([id number] - [place], [place] - [address]  Education was given and patient verbalized understanding.  Other than that, she had no other questions or concerns at this time.  Please follow up with patient as needed.    Thank you!

## 2018-04-30 MED ORDER — ATORVASTATIN CALCIUM 10 MG PO TABS
10.00 mg | ORAL_TABLET | Freq: Every day | ORAL | 2 refills | Status: DC
Start: 2018-04-30 — End: 2018-07-27

## 2018-04-30 NOTE — Addendum Note (Signed)
Addended by: Jeraldine Loots on: 04/30/2018 12:45 PM     Modules accepted: Orders

## 2018-04-30 NOTE — Telephone Encounter (Signed)
script sent for atorvastatin 10mg  once daily. Please inform  Patient.

## 2018-04-30 NOTE — Telephone Encounter (Signed)
Left message for patient to return call.

## 2018-05-12 ENCOUNTER — Ambulatory Visit: Payer: Medicare Other

## 2018-05-14 ENCOUNTER — Ambulatory Visit
Admission: RE | Admit: 2018-05-14 | Discharge: 2018-05-14 | Disposition: A | Discharge status: Home / Self Care | Payer: Medicare Other | Source: Ambulatory Visit | Attending: Internal Medicine | Admitting: Internal Medicine

## 2018-05-14 DIAGNOSIS — M8580 Other specified disorders of bone density and structure, unspecified site: Secondary | ICD-10-CM

## 2018-05-14 DIAGNOSIS — M8589 Other specified disorders of bone density and structure, multiple sites: Secondary | ICD-10-CM | POA: Insufficient documentation

## 2018-05-14 DIAGNOSIS — Z13820 Encounter for screening for osteoporosis: Secondary | ICD-10-CM | POA: Insufficient documentation

## 2018-05-14 DIAGNOSIS — Z0000 Encounter for general adult medical examination without abnormal findings: Secondary | ICD-10-CM

## 2018-05-20 ENCOUNTER — Telehealth (INDEPENDENT_AMBULATORY_CARE_PROVIDER_SITE_OTHER): Payer: Self-pay | Admitting: Internal Medicine

## 2018-05-20 NOTE — Telephone Encounter (Signed)
Have not done physical therapy  Referral in the recent ( over the past one year ). Please advise follow up in the office. Try to schedule her in earlier if needed.

## 2018-05-20 NOTE — Telephone Encounter (Signed)
Message sent to Dr. Balaji for review.

## 2018-05-20 NOTE — Telephone Encounter (Signed)
Pt is calling requesting another referral to physical therapy for her  Back problems.    Pcp's next available is 12/26, and pt is wondering if pcp can give her the referral so she can go to PT sooner.    Pt would like to go to the same place.       [facility]  [id number]    Tel:   [phone]  [phone]

## 2018-05-20 NOTE — Telephone Encounter (Signed)
Patient scheduled for tomorrow at 10:45am

## 2018-05-21 ENCOUNTER — Encounter (INDEPENDENT_AMBULATORY_CARE_PROVIDER_SITE_OTHER): Payer: Self-pay | Admitting: Internal Medicine

## 2018-05-21 ENCOUNTER — Ambulatory Visit (INDEPENDENT_AMBULATORY_CARE_PROVIDER_SITE_OTHER): Payer: Medicare Other | Admitting: Internal Medicine

## 2018-05-21 ENCOUNTER — Other Ambulatory Visit (INDEPENDENT_AMBULATORY_CARE_PROVIDER_SITE_OTHER): Payer: Self-pay | Admitting: Internal Medicine

## 2018-05-21 VITALS — BP 118/63 | HR 79 | Temp 98.0°F | Ht 58.86 in | Wt 113.0 lb

## 2018-05-21 DIAGNOSIS — M8580 Other specified disorders of bone density and structure, unspecified site: Secondary | ICD-10-CM

## 2018-05-21 DIAGNOSIS — M5442 Lumbago with sciatica, left side: Secondary | ICD-10-CM

## 2018-05-21 DIAGNOSIS — G8929 Other chronic pain: Secondary | ICD-10-CM

## 2018-05-21 DIAGNOSIS — K2270 Barrett's esophagus without dysplasia: Secondary | ICD-10-CM

## 2018-05-21 NOTE — Progress Notes (Signed)
Subjective:       Patient ID: Michele [name] is a 77 y.o. female.  Chief Complaint   Patient presents with   . Back Pain     chronic       Back Pain   This is a chronic problem. The current episode started more than 1 month ago. The problem occurs daily. The pain is present in the lumbar spine. The quality of the pain is described as aching. The pain radiates to the left thigh and left foot. The pain is moderate. The symptoms are aggravated by position and twisting. Associated symptoms include leg pain and tingling. Pertinent negatives include no bladder incontinence, bowel incontinence, chest pain, dysuria, fever, numbness, paresthesias, perianal numbness or weakness. Risk factors include menopause. She has tried home exercises for the symptoms. The treatment provided mild relief.     No problems updated.    The following portions of the patient's history were reviewed and updated as appropriate: She  has a past medical history of Abnormal EKG, Arthralgia, Barrett's esophagus, Cervical spinal stenosis, Colonic polyp, Depression, Disc disorder, Gastroesophageal reflux disease, Lower back pain, Numbness and tingling of both legs (02/06/2015), Osteoarthritis, and Osteopenia.  She does not have any pertinent problems on file.  She  has a past surgical history that includes Appendectomy (1960?) and Tubal ligation (1973).  Her family history is not on file.  She  reports that she quit smoking about 16 months ago. Her smoking use included cigarettes. She has a 25.00 pack-year smoking history. She has never used smokeless tobacco. She reports that she does not drink alcohol or use drugs.  She has a current medication list which includes the following prescription(s): aspirin ec, atorvastatin, calcium carbonate, vitamin e, and omeprazole.  She has No Known Allergies..    Review of Systems   Constitutional: Negative for chills, fatigue and fever.   Respiratory: Negative for shortness of breath.    Cardiovascular: Negative for  chest pain.   Gastrointestinal: Negative for bowel incontinence.   Genitourinary: Negative for bladder incontinence and dysuria.   Musculoskeletal: Positive for back pain and myalgias. Negative for gait problem.   Neurological: Positive for tingling. Negative for dizziness, weakness, numbness and paresthesias.           Objective:    Physical Exam  Vitals signs and nursing note reviewed.   Constitutional:       Appearance: Normal appearance.   Cardiovascular:      Rate and Rhythm: Normal rate.   Pulmonary:      Effort: Pulmonary effort is normal.   Musculoskeletal:      Comments: Low back; no spinal tenderness. NROM .   SLR_ negative bilaterally.   Neurological:      Mental Status: She is alert.     Blood pressure 118/63, pulse 79, temperature 98 F (36.7 C), temperature source Oral, height 1.495 m (4' 10.86"), weight 51.3 kg (113 lb).        Assessment:       1. Chronic bilateral low back pain with left-sided sciatica  Ambulatory referral to Physical Therapy   2. Osteopenia, unspecified location             Plan:       1. 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 Osteopenia, unspecified location  Discussed on recent dexa results. Worsening.   Advised ca and vitamin  D supplements  Regular weight bearing exercises as tolerated.     F/u in 6-8 weeks or earlier prn       Procedures

## 2018-05-21 NOTE — Progress Notes (Signed)
Have you seen any specialists/other providers since your last visit with us?    No    Arm preference verified?   Yes    The patient is due for spirometry, shingles vaccine and pneumonia vaccine

## 2018-05-22 ENCOUNTER — Encounter (INDEPENDENT_AMBULATORY_CARE_PROVIDER_SITE_OTHER): Payer: Self-pay | Admitting: Internal Medicine

## 2018-05-26 ENCOUNTER — Encounter (INDEPENDENT_AMBULATORY_CARE_PROVIDER_SITE_OTHER): Payer: Self-pay

## 2018-06-18 ENCOUNTER — Other Ambulatory Visit (INDEPENDENT_AMBULATORY_CARE_PROVIDER_SITE_OTHER): Payer: Self-pay | Admitting: Internal Medicine

## 2018-06-18 DIAGNOSIS — K2270 Barrett's esophagus without dysplasia: Secondary | ICD-10-CM

## 2018-06-22 ENCOUNTER — Inpatient Hospital Stay: Payer: Medicare Other | Attending: Internal Medicine

## 2018-06-22 VITALS — BP 110/59 | HR 64

## 2018-06-22 DIAGNOSIS — M5442 Lumbago with sciatica, left side: Secondary | ICD-10-CM | POA: Insufficient documentation

## 2018-06-22 NOTE — PT/OT Therapy Note (Deleted)
Name: Michele [name] Age: 77 y.o.   Date of Service: 06/22/2018  Referring Physician: Jeraldine Loots, MD   Date of Injury: No data was found  Date Care Plan Established/Reviewed: 06/22/2018  Date Treatment Started: 06/22/2018  End of Certification Date: 08/20/2018  Sessions in Plan of Care: 16  Surgery Date: No data was found    Visit Count: 1   Diagnosis: No diagnosis found.    Subjective     History of Present Illness   History of Present Illness: Holds hand using non-reciprocal pattern but it is difficult  Standing to wash dishes hurts   Pt is limited with sleeping due to hip pain  Pt is limited with bending forward due to pain  Functional Limitations (PLOF): ***    Social Support/Occupation  Lives in: multiple level home        Chronic LBP L sided sciatica.     Had injury 1.5 years ago and broke her knee cap when she tripped over the median in the street on the way to her husband's funeral. She had a repair of her knee cap 2 weeks later by Dr. Salomon Mast. She wore brace for 5 months and it healed well. MD didn't send her for PT but gave her some exercises. The L leg/knee cap still bother her a lot. The low back hurt before that, but after breaking the knee cap she developed L LE pain and she still has sharp pains. Went back to MD who took x-rays again and showed that the bone wasn't broken.     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 Neurologist said that the pressure from the c/s stenosis causes her shaking. Before the knee cap broke she had shaking, but afterward it happened more. They recommend surgery for c/s stenosis but she doesn't know if she should do it or not.    Reports having burning/tingling in L leg throughout the day          Precautions: No data was found  Allergies: Patient has no known allergies.    Objective   12.17.19: Pt lost balance a bit but righted herself while walking into tx session     R-rotated sacrum, R-rotated coccyx in prone, L-rotated L4-5    L PSIS  elevated in prone    Range of Motion     AROM: Lumbar Spine   12.17.19    Lumbar Flexion 5, 45 from hips pain   Lumbar Extension 21/8 from hips pain   (blank fields were intentionally left blank)    12.17.19   Left AROM    Left PROM   Lumbar/Hip 12.17.19   Right AROM    Right PROM   ~20 %    Lumbar Rotation ~50%          Hip Flexion           Hip Extension           Hip Abduction           Hip Adduction           Hip IR           Hip ER       (blank fields were intentionally left blank)  12 l/s lordosis        Strength     12.17.19   Left Strength  Hip  MMT 12.17.19   Right   3+  Hip Flexion 3+    3+  Hip Extension 3+      Hip Abduction       Hip Adduction       Hip IR       Hip ER       Quadriceps       Hamstrings     (blank fields were intentionally left blank)    12.17.19   Left Strength  Knee  MMT 12.17.19   Right   4: shaking noted  Knee Flexion 4+    4  Knee Extension 4    (blank fields were intentionally left blank)    12.17.19   Left Strength  Ankle/Foot  MMT 12.17.19   Right   4  Ankle Dorsiflexion 5      Ankle Plantarflexion       Ankle Inversion       Ankle Eversion     (blank fields were intentionally left blank)  Poor stability of pelvis noted with hip extension MMT B    Palpation TTP long and short posterior SIJ ligs (reproduce pt's LBP)  TTP and hypomobility L1-2      Flexibility Inflexible quads in prone, L>R      BP: 110/59 Heart Rate: 64    Treatment     Therapeutic Exercises   Justification: Issue HEP  Recommended usage of walking stick or cane for walking    Rolling pin L quad x 5'    Peroneal nerve glide in supine    LTR         Assessment   Oluwateniola is a 77 y.o. female presenting with LBP who requires Physical Therapy for the following:  Impairments:   {IPTC Impairments:45437}    Pain located: lumbar spine, B hips, anterior thigh and lower leg (in linear pattern)    Clinical presentation: {IPTC Clinical Presentation:210901007} ***  Barriers to therapy: {IPTC BARRIERS TO REHAB:210901008}  Prior  Level of Function: ***  Prognosis: good  Plan   Visits per week: 2  Number of Sessions: 16  Direct One on One  62952: Therapeutic Exercise: To Develop Strength and Endurance, ROM and Flexibility  [id number]: Gait Training  84132: Neuromuscular Reeducation (Proprioceptive Neuromuscular Faciliation)  97140: Manual Therapy techniques (mobilization, manipulation, manual traction) (Grade I-V to lumbar spine, pelvic girdle, and regionally interdependent joints)  97530: Therapeutic Activities: Dynamic activities to improve functional performance  Dry Needling  Supervised Modalities  97010: Thermal modalities: hot/cold packs  44010: Mechnical traction  97014: Electrical stimulation    Goals    Goal 1:  Pt will independently perform their home exercise program to maintain gains between PT visits.      Sessions:  3      Goal 2:  Patient will improve LE strength in all deficient planes by >= 1/2 grade to facilitate efficient, safe transfers.   Sessions:  8      Goal 3:  Pt will demonstrate ____ to __ degrees to allow them to move ___ to reach for objects.     Sessions:  [address] PT

## 2018-06-22 NOTE — Progress Notes (Signed)
Name:Michele [name] Age: 77 y.o.   Date of Service: 06/22/2018  Referring Physician: Jeraldine Loots, MD   Date of Injury: 05/21/2018  Date Care Plan Established/Reviewed: 06/22/2018  Date Treatment Started: 06/22/2018  End of Certification Date: 08/20/2018  Sessions in Plan of Care: 16  Surgery Date: No data was found      Visit Count: 1   Diagnosis:   1. Bilateral low back pain with left-sided sciatica, unspecified chronicity        Subjective     History of Present Illness   Mechanism of injury: Unknown, but developed after fracturing L patella   History of Present Illness: Chronic LBP L sided sciatica.     Had injury 1.5 years ago and broke her knee cap when she tripped over the median in the street on the way to her husband's funeral. She had a repair of her knee cap 2 weeks later by Dr. Salomon Mast. She wore brace for 5 months and it healed well. MD didn't send her for PT but gave her some exercises. The L leg/knee cap still bother her a lot. The low back hurt before that, but after breaking the knee cap she developed L LE pain and she still has sharp pains. Went back to MD who took x-rays again and showed that the bone wasn't broken.     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 Neurologist said that the pressure from the c/s stenosis causes her shaking. Before the knee cap broke she had shaking, but afterward it happened more. They recommend surgery for c/s stenosis but she doesn't know if she should do it or not.    Reports having burning/tingling in L leg throughout the day  Functional Limitations (PLOF): Holds hand using non-reciprocal pattern but it is difficult (Pt could negotiate at least 3 FOS without difficulty prior to onset)  Standing to wash dishes hurts (Pt performed without difficulty prior to onset)  Pt is limited with sleeping due to hip pain (Pt slept without difficulty prior)  Pt is limited with bending forward due to pain  (Pt slept without difficulty  prior)    Social Support/Occupation  Lives in: multiple level home          Precautions: depression/anxiety  cardiac  osteopenia/osteoporosis  Allergies: Patient has no known allergies.    Past Medical History:   Diagnosis Date   . Abnormal EKG    . Arthralgia    . Barrett's esophagus    . Cervical spinal stenosis    . Colonic polyp    . Depression    . Disc disorder    . Gastroesophageal reflux disease    . Lower back pain    . Numbness and tingling of both legs 02/06/2015   . Osteoarthritis    . Osteopenia        Objective   12.17.19: Pt lost balance a bit but righted herself while walking into tx     Posture: Posterior/anterior (Saliba's postural classification)    Tremors noted in standing on L LE and during gait      R-rotated sacrum, R-rotated coccyx in prone, L-rotated L4-5    L PSIS elevated in prone      Range of Motion     AROM: Lumbar Spine   12.17.19    Lumbar Flexion 5, 45 from hips    Lumbar Extension 21/8 from hips    (blank fields were intentionally left blank)  12.17.19   Left AROM    Left PROM   Lumbar/Hip 12.17.19   Right AROM    Right PROM   ~20 %    Lumbar Rotation ~50 %          Hip Flexion           Hip Extension           Hip Abduction           Hip Adduction           Hip IR           Hip ER       (blank fields were intentionally left blank)  12.17.19: 12 degree l/s lordosis        Strength     12.17.19   Left Strength  Hip  MMT 12.17.19   Right   3+  Hip Flexion 3+    3+  Hip Extension 3+      Hip Abduction       Hip Adduction       Hip IR       Hip ER       Quadriceps       Hamstrings     (blank fields were intentionally left blank)    12.17.19   Left Strength  Knee  MMT 12.17.19   Right   4: shaking noted  Knee Flexion 4+    4  Knee Extension 4    (blank fields were intentionally left blank)    12.17.19   Left Strength  Ankle/Foot  MMT 12.17.19   Right   4  Ankle Dorsiflexion 5      Ankle Plantarflexion       Ankle Inversion       Ankle Eversion     (blank fields were intentionally left  blank)  12.17.19: Poor stability of pelvis noted with hip extension MMT B    Palpation 12.17.19:   TTP long and short posterior SIJ ligs (reproduce pt's LBP)  TTP and hypomobility L1-2    Flexibility 12.17.19: Inflexible quads in prone, L>R  + Peroneal nerve tension L (re-creates pt's lower leg symptoms)      BP: 110/59 Heart Rate: 64    Treatment     Therapeutic Exercises   Justification: Issue HEP, for safety  Recommended usage of walking stick or cane for walking. Discussion regarding need for this to prevent falls.     Rolling pin L quad x 5'    Peroneal nerve glide in supine    LTR       ---      ---   Total Time   Timed Minutes  25 minutes   Untimed Minutes  20 minutes   Total Time  45 minutes        Assessment   Lyda is a 77 y.o. female presenting with LBP with L-sided pain of anterior thigh and lower leg who requires Physical Therapy. She demonstrates significant TTP along the SIJ ligaments, which re-create her LBP as well as L peroneal nerve tension, testing for which reproduces her L lower limb pain. She demonstrates limited l/s AROM into flexion and B rotation and poor gluteal strength. Low balance is noted with walking and she reports lower extremity shaking when standing which she reports is due to cervical spine stenosis, according to her neurologist, and that her MD recommends surgery for this. She requires physical therapy for the following:  Impairments:   IPTC Impairments: Pain  that limits and interferes with functional ability  Impaired postural alignment  Decreased range of motion  Decreased strength  Decreased functional stability  Decreased joint mobility  Decreased soft tissue mobility  Decreased/impaired motor control  Neural tension  Decreased coordination  Decreased static balance  Decreased dynamic balance    Pain located: LBP with B hip pain and L-sided pain of anterior thigh and lower leg    Clinical presentation: evolving: pain is worsening over time  Barriers to therapy: Patient's  age (May require prolonged period of rehabilitation)  Time since onset of injury/illness/exacerbation (May require prolonged period of rehabilitation)  Past surgical history: L patellar repair (May require prolonged period of rehabilitation)  Comorbidities: c/s stenosis (May require prolonged period of rehabilitation, return to full PLOF may be limited)  Prior Level of Function: Holds hand using non-reciprocal pattern but it is difficult (Pt could negotiate at least 3 FOS without difficulty prior to onset)  Standing to wash dishes hurts (Pt performed without difficulty prior to onset)  Pt is limited with sleeping due to hip pain (Pt slept without difficulty prior)  Pt is limited with bending forward due to pain  (Pt slept without difficulty prior)  Prognosis: good  Plan   Visits per week: 2  Number of Sessions: 16  Direct One on One  24401: Therapeutic Exercise: To Develop Strength and Endurance, ROM and Flexibility  [id number]: Gait Training  02725: Neuromuscular Reeducation (Proprioceptive Neuromuscular Faciliation)  97140: Manual Therapy techniques (mobilization, manipulation, manual traction) (Grade I-V to lumbar spine, pelvic girdle, and regionally interdependent joints)  97530: Therapeutic Activities: Dynamic activities to improve functional performance  Dry Needling  Supervised Modalities  97010: Thermal modalities: hot/cold packs  36644: Mechnical traction  97014: Electrical stimulation    Goals    Goal 1:  Pt will independently perform their home exercise program to maintain gains between PT visits.       Rolling pin L quad: x 5'  1x/day  Peroneal nerve glide in supine: 2x10, 2x/day  LTR: 2x10, 2x/day     Sessions:  3      Goal 2:  Patient will improve LE strength in all deficient planes by >= 1/2 grade to facilitate improved ability to negotiate 1 FOS using reciprocal pattern without difficulty.    Sessions:  8      Goal 3:  Pt will demonstrate forward flexion AROM to at least 60 degrees degrees to allow them  to move forward to reach for objects on the floor.     Sessions:  [address], PT

## 2018-06-23 ENCOUNTER — Encounter (INDEPENDENT_AMBULATORY_CARE_PROVIDER_SITE_OTHER): Payer: Self-pay

## 2018-06-24 ENCOUNTER — Inpatient Hospital Stay: Payer: Medicare Other | Admitting: Rehabilitative and Restorative Service Providers"

## 2018-06-24 DIAGNOSIS — M5442 Lumbago with sciatica, left side: Secondary | ICD-10-CM

## 2018-06-24 NOTE — PT/OT Therapy Note (Signed)
Name: Michele [name] Age: 77 y.o.   Date of Service: 06/24/2018  Referring Physician: Jeraldine Loots, MD   Date of Injury: 05/21/2018  Date Care Plan Established/Reviewed: 06/22/2018  Date Treatment Started: 06/22/2018  End of Certification Date: 08/20/2018  Sessions in Plan of Care: 16  Surgery Date: No data was found    Visit Count: 2   Diagnosis:   1. Bilateral low back pain with left-sided sciatica, unspecified chronicity        Subjective     Social Support/Occupation  Lives in: multiple level home        I have pains in my lower back and I have pain in the whole L thigh. Every now and then I have to be careful with my balance because of my shaking but I have not fallen. I don't think I need my cane at the moment.       Precautions: depression/anxiety  cardiac  osteopenia/osteoporosis  Allergies: Patient has no known allergies.    Objective   R rotated sacrum    Most pain with lumbar extension             Treatment     Neuromuscular Re-Education   Supine lumbar flexion with hip flexion with long duration isometric hold (ab series position 1) to improve lumbar flexion motor control and isometric stability.       Manual Therapy   Justification: To improve joint and soft tissue restrictions     Superficial fascia over sacrum and SI jts   Bony contours spinal groove, pelvic crest  Strumming and perpendicular deformation of bilateral paraspinals    Gr 4 caudal glide  sacrum with knee flex, silent metronome  Gr 3 PA R sacrum with knee flex, silent metronome  Gr 3 caudal glide R innominate with knee flex, silent metronome               ---      ---   Total Time   Timed Minutes  34 minutes   Total Time  34 minutes        Assessment   Pt late to appointment so adjusted appointment accordingly. Noted mechanical restrictions in the lumbopelvic spine. Pt with limited stance stability of the L LE during gait, consider weight acceptance training in sitting next session.   Plan   F/u on HEP, review sidelying sleep posture,  weight acceptance training in L LE      Goals    Goal 1:  Pt will independently perform their home exercise program to maintain gains between PT visits.       Rolling pin L quad: x 5'  1x/day  Peroneal nerve glide in supine: 2x10, 2x/day  LTR: 2x10, 2x/day     Sessions:  3      Goal 2:  Patient will improve LE strength in all deficient planes by >= 1/2 grade to facilitate improved ability to negotiate 1 FOS using reciprocal pattern without difficulty.    Sessions:  8      Goal 3:  Pt will demonstrate forward flexion AROM to at least 60 degrees degrees to allow them to move forward to reach for objects on the floor.     Sessions:  16                                   Saralyn Pilar, PT

## 2018-06-28 ENCOUNTER — Inpatient Hospital Stay: Payer: Medicare Other

## 2018-06-28 DIAGNOSIS — M5442 Lumbago with sciatica, left side: Secondary | ICD-10-CM

## 2018-06-28 NOTE — PT/OT Therapy Note (Signed)
Name: Michele [name] Age: 77 y.o.   Date of Service: 06/28/2018  Referring Physician: Jeraldine Loots, MD   Date of Injury: 05/21/2018  Date Care Plan Established/Reviewed: 06/22/2018  Date Treatment Started: 06/22/2018  End of Certification Date: 08/20/2018  Sessions in Plan of Care: 16  Surgery Date: No data was found    Visit Count: 3   Diagnosis:   1. Bilateral low back pain with left-sided sciatica, unspecified chronicity             Precautions: depression/anxiety  cardiac  osteopenia/osteoporosis  Allergies: Patient has no known allergies.    Objective   (-) slump and extension sitting             Treatment     Therapeutic Exercises   Silent metronome  Ab series position one with use of pillow for tailbone lift  Bridging with VC glute squeeze    Manual Therapy   L hip on axis into ER CR IR f/b PH ER and COI small range  BCC PSIS, sacral sulcus, SIJ, sacrococcygeal border, coccyx  STM deep hip rotators, sacrotuberous lig, sacrospinous lig with clamshells  Gr 4 caudal glide/gapping sacrococcygeal junction  Gr 4 PA coccyx on R  Gr 4 shear coccyx to R       ---      ---   Total Time   Timed Minutes  44 minutes   Total Time  44 minutes        Assessment   Hip flexion improves to 4-/5 B (3+/5 at IE). Continue to address sacral and innominate dysfunction and core control to improve alignment and MC during standing and walking.  Plan   Sacrum  Innominate      Goals    Goal 1:  Pt will independently perform their home exercise program to maintain gains between PT visits.       Rolling pin L quad: x 5'  1x/day  Peroneal nerve glide in supine: 2x10, 2x/day  LTR: 2x10, 2x/day     Sessions:  3      Goal 2:  Patient will improve LE strength in all deficient planes by >= 1/2 grade to facilitate improved ability to negotiate 1 FOS using reciprocal pattern without difficulty.    Sessions:  8      Goal 3:  Pt will demonstrate forward flexion AROM to at least 60 degrees degrees to allow them to move forward to reach for objects  on the floor.     Sessions:  [address]                                   Santiago Glad, PT

## 2018-07-01 ENCOUNTER — Ambulatory Visit (INDEPENDENT_AMBULATORY_CARE_PROVIDER_SITE_OTHER): Payer: Medicare Other | Admitting: Internal Medicine

## 2018-07-06 ENCOUNTER — Inpatient Hospital Stay: Payer: Medicare Other | Admitting: Rehabilitative and Restorative Service Providers"

## 2018-07-06 DIAGNOSIS — M5442 Lumbago with sciatica, left side: Secondary | ICD-10-CM

## 2018-07-06 NOTE — PT/OT Therapy Note (Signed)
Name: Michele [name] Age: 77 y.o.   Date of Service: 07/06/2018  Referring Physician: Jeraldine Loots, MD   Date of Injury: 05/21/2018  Date Care Plan Established/Reviewed: 06/22/2018  Date Treatment Started: 06/22/2018  End of Certification Date: 08/20/2018  Sessions in Plan of Care: 16  Surgery Date: No data was found    Visit Count: 4   Diagnosis:   1. Bilateral low back pain with left-sided sciatica, unspecified chronicity        Subjective     Social Support/Occupation  Lives in: multiple level home        The pain still feels the same. I have been doing my exercises.       Precautions: depression/anxiety  cardiac  osteopenia/osteoporosis  Allergies: Patient has no known allergies.    Objective   Decreased PD on the L side             Treatment     Neuromuscular Re-Education   Pelvic posterior depression with end range holds to increase weight bearing into L LE to improve weight acceptance      Manual Therapy   Justification: To improve joint and soft tissue restrictions     S/L to improve pelvic mobility and ROM into AE/PD  Bony contours pelvic crest, inferior ribcage, lumbar spinal groove  STM QL, lumbar paraspinals, iliacus/abdom contents    Gapping of spinous processes T12-S1 to facilitate facet opening with resisted pelvic AE        Therapeutic Activity   Seated hip hinge with cues to load weight into LE's with hinge forward and push through LE's to return to sitting for inc weight acceptance into LE's with squatting/transitioning         ---      ---   Total Time   Timed Minutes  38 minutes   Total Time  38 minutes        Assessment   Pt with difficulty moving into pelvic PD on the L side due to pain in the SI joint. She required TCs for proper LE weight acceptance to improve L quad contraction. Continue to assess sacral and innominate mobility to improve weight acceptance on the L.  Plan   Check pelvis, sacrum      Goals    Goal 1:  Pt will independently perform their home exercise program to maintain  gains between PT visits.       Rolling pin L quad: x 5'  1x/day  Peroneal nerve glide in supine: 2x10, 2x/day  LTR: 2x10, 2x/day     Sessions:  3      Goal 2:  Patient will improve LE strength in all deficient planes by >= 1/2 grade to facilitate improved ability to negotiate 1 FOS using reciprocal pattern without difficulty.    Sessions:  8      Goal 3:  Pt will demonstrate forward flexion AROM to at least 60 degrees degrees to allow them to move forward to reach for objects on the floor.     Sessions:  16                                   Saralyn Pilar, PT

## 2018-07-13 ENCOUNTER — Inpatient Hospital Stay: Payer: Medicare Other | Admitting: Rehabilitative and Restorative Service Providers"

## 2018-07-14 ENCOUNTER — Encounter (INDEPENDENT_AMBULATORY_CARE_PROVIDER_SITE_OTHER): Payer: Self-pay

## 2018-07-16 ENCOUNTER — Other Ambulatory Visit (INDEPENDENT_AMBULATORY_CARE_PROVIDER_SITE_OTHER): Payer: Self-pay | Admitting: Internal Medicine

## 2018-07-16 DIAGNOSIS — K2270 Barrett's esophagus without dysplasia: Secondary | ICD-10-CM

## 2018-07-20 ENCOUNTER — Other Ambulatory Visit: Payer: Self-pay | Admitting: Orthopaedic Surgery

## 2018-07-20 DIAGNOSIS — M25562 Pain in left knee: Secondary | ICD-10-CM

## 2018-07-27 ENCOUNTER — Other Ambulatory Visit (INDEPENDENT_AMBULATORY_CARE_PROVIDER_SITE_OTHER): Payer: Self-pay | Admitting: Internal Medicine

## 2018-07-27 DIAGNOSIS — E785 Hyperlipidemia, unspecified: Secondary | ICD-10-CM

## 2018-07-28 ENCOUNTER — Ambulatory Visit
Admission: RE | Admit: 2018-07-28 | Discharge: 2018-07-28 | Disposition: A | Discharge status: Home / Self Care | Payer: Medicare Other | Source: Ambulatory Visit | Attending: Orthopaedic Surgery | Admitting: Orthopaedic Surgery

## 2018-07-28 DIAGNOSIS — M25562 Pain in left knee: Secondary | ICD-10-CM

## 2018-07-28 DIAGNOSIS — R936 Abnormal findings on diagnostic imaging of limbs: Secondary | ICD-10-CM | POA: Insufficient documentation

## 2018-07-28 DIAGNOSIS — M67864 Other specified disorders of tendon, left knee: Secondary | ICD-10-CM | POA: Insufficient documentation

## 2018-07-29 ENCOUNTER — Inpatient Hospital Stay: Payer: Self-pay

## 2018-07-29 ENCOUNTER — Telehealth: Payer: Self-pay

## 2018-07-29 DIAGNOSIS — Z029 Encounter for administrative examinations, unspecified: Secondary | ICD-10-CM | POA: Insufficient documentation

## 2018-07-29 NOTE — Telephone Encounter (Signed)
No answer when patient called. Could not leave VM or callback number.

## 2018-07-30 ENCOUNTER — Inpatient Hospital Stay: Payer: Medicare Other | Attending: Internal Medicine | Admitting: Rehabilitative and Restorative Service Providers"

## 2018-07-30 DIAGNOSIS — M5442 Lumbago with sciatica, left side: Secondary | ICD-10-CM | POA: Insufficient documentation

## 2018-07-30 NOTE — PT/OT Therapy Note (Signed)
Name: Michele [name] Age: 78 y.o.   Date of Service: 07/30/2018  Referring Physician: Jeraldine Loots, MD   Date of Injury: 05/21/2018  Date Care Plan Established/Reviewed: 06/22/2018  Date Treatment Started: 06/22/2018  End of Certification Date: 08/20/2018  Sessions in Plan of Care: 16  Surgery Date: No data was found    Visit Count: 5   Diagnosis:   1. Bilateral low back pain with left-sided sciatica, unspecified chronicity        Subjective     Social Support/Occupation  Lives in: multiple level home        The L knee gives me lots of problems. My doctor wants me to have surgery to take a screw out. I will follow up with my MD next week for f/u on my MRI results. I don't know if the lower back or the knee is worse.       Precautions: depression/anxiety  cardiac  osteopenia/osteoporosis  Allergies: Patient has no known allergies.    Objective   (+) Forward bend on L              Treatment     Neuromuscular Re-Education   Justification: To improve neuromuscular initiation  LTR with facilitation and with resistance to improve segmental control of rotation throughout spine     Supine bridging with manual facilitation with approximation or traction as needed at knees or pelvis using PNF techniques to improve glut and core co-activation, neuromuscular control of gluts and core, and posterior chain initiation.       Manual Therapy   Justification: To improve joint and soft tissue restrictions     Patient in prone over pillows  Superficial fascia bilateral thoracic and lumbar paraspinals   Superficial fascia over sacrum and SI jts   Bony contours spinal groove, pelvic crest  Strumming and perpendicular deformation of bilateral paraspinals    Gr 3 caudal glide  sacrum with knee flex, silent metronome  Gr 4 L SIJ gapping with resisted hip ER  Gr 3 PA L sacrum with knee flex, silent metronome        Therapeutic Activity   Justification: To improve sit to stand transfers   Weight acceptance training with main iso holds through  R then L LE at end range hip hinge progressed to cues initiate motion by pushing legs into ground, hinging forward at the waist then continue with push into ground to return to upright         ---      ---   Total Time   Timed Minutes  43 minutes   Total Time  43 minutes        Assessment   Increased L lumbar extensor tone noted during sit to stand activity so deferred this activity for today. Consider pelvic AD to improve QL length. Pt would benefit from progressions of LE strength to improve standing and walking tolerance.   Plan   Pelvic AD training on L      Goals    Goal 1:  Pt will independently perform their home exercise program to maintain gains between PT visits.       Rolling pin L quad: x 5'  1x/day  Peroneal nerve glide in supine: 2x10, 2x/day  LTR: 2x10, 2x/day     Sessions:  3      Goal 2:  Patient will improve LE strength in all deficient planes by >= 1/2 grade to facilitate improved ability to negotiate 1 FOS using reciprocal pattern  without difficulty.    Sessions:  8      Goal 3:  Pt will demonstrate forward flexion AROM to at least 60 degrees degrees to allow them to move forward to reach for objects on the floor.     Sessions:  16                                   Saralyn Pilar, PT

## 2018-08-09 ENCOUNTER — Inpatient Hospital Stay: Payer: Medicare Other | Admitting: Rehabilitative and Restorative Service Providers"

## 2018-08-09 ENCOUNTER — Other Ambulatory Visit (INDEPENDENT_AMBULATORY_CARE_PROVIDER_SITE_OTHER): Payer: Self-pay | Admitting: Internal Medicine

## 2018-08-09 DIAGNOSIS — K2270 Barrett's esophagus without dysplasia: Secondary | ICD-10-CM

## 2018-08-11 ENCOUNTER — Telehealth (INDEPENDENT_AMBULATORY_CARE_PROVIDER_SITE_OTHER): Payer: Self-pay | Admitting: Internal Medicine

## 2018-08-11 ENCOUNTER — Inpatient Hospital Stay: Payer: Medicare Other

## 2018-08-11 DIAGNOSIS — Z1239 Encounter for other screening for malignant neoplasm of breast: Secondary | ICD-10-CM

## 2018-08-11 NOTE — Telephone Encounter (Signed)
Referral Request    Reason for Referral or Diagnosis:      Requested Referral Information:    Office Name/Specialty:  Mammogram   Doctor Name (If requesting specific provider):      Appointment Scheduled (Y/N):  No   If Yes, Date of Scheduled appt:     Patient contact no:  [phone]

## 2018-08-11 NOTE — Telephone Encounter (Signed)
Mammogram orders placed. Please inform

## 2018-08-11 NOTE — Telephone Encounter (Signed)
PATIENT INFORMED

## 2018-08-16 ENCOUNTER — Inpatient Hospital Stay: Payer: Medicare Other

## 2018-08-18 ENCOUNTER — Inpatient Hospital Stay: Payer: Medicare Other

## 2018-08-18 ENCOUNTER — Encounter (INDEPENDENT_AMBULATORY_CARE_PROVIDER_SITE_OTHER): Payer: Self-pay

## 2018-08-23 ENCOUNTER — Inpatient Hospital Stay: Payer: Medicare Other | Admitting: Rehabilitative and Restorative Service Providers"

## 2018-08-25 ENCOUNTER — Inpatient Hospital Stay: Payer: Medicare Other

## 2018-08-30 ENCOUNTER — Inpatient Hospital Stay: Payer: Medicare Other | Admitting: Rehabilitative and Restorative Service Providers"

## 2018-09-01 ENCOUNTER — Inpatient Hospital Stay: Payer: Medicare Other

## 2018-09-04 ENCOUNTER — Other Ambulatory Visit (INDEPENDENT_AMBULATORY_CARE_PROVIDER_SITE_OTHER): Payer: Self-pay | Admitting: Internal Medicine

## 2018-09-04 DIAGNOSIS — K2270 Barrett's esophagus without dysplasia: Secondary | ICD-10-CM

## 2018-09-12 ENCOUNTER — Encounter (INDEPENDENT_AMBULATORY_CARE_PROVIDER_SITE_OTHER): Payer: Self-pay

## 2018-09-13 ENCOUNTER — Ambulatory Visit
Admission: RE | Admit: 2018-09-13 | Discharge: 2018-09-13 | Disposition: A | Discharge status: Home / Self Care | Payer: Medicare Other | Source: Ambulatory Visit | Attending: Internal Medicine | Admitting: Internal Medicine

## 2018-09-13 ENCOUNTER — Other Ambulatory Visit (INDEPENDENT_AMBULATORY_CARE_PROVIDER_SITE_OTHER): Payer: Self-pay | Admitting: Internal Medicine

## 2018-09-13 DIAGNOSIS — Z1231 Encounter for screening mammogram for malignant neoplasm of breast: Secondary | ICD-10-CM | POA: Insufficient documentation

## 2018-09-13 DIAGNOSIS — Z1239 Encounter for other screening for malignant neoplasm of breast: Secondary | ICD-10-CM

## 2018-09-18 ENCOUNTER — Encounter (INDEPENDENT_AMBULATORY_CARE_PROVIDER_SITE_OTHER): Payer: Self-pay

## 2018-09-24 NOTE — PT/OT Therapy Note (Signed)
Name: Michele [name] Age: 78 y.o.   Date of Service: 07/30/2018  Referring Physician: Jeraldine Loots, MD   Date of Injury: 05/21/2018  Date Care Plan Established/Reviewed: 06/22/2018  Date Treatment Started: 06/22/2018  End of Certification Date: 08/20/2018  Sessions in Plan of Care: 16  Surgery Date: No data was found    Visit Count: 5   Diagnosis:   1. Bilateral low back pain with left-sided sciatica, unspecified chronicity      Discontinuation of Therapy Services    Michele [name] did not complete prescribed physical therapy visits.      Status is unknown at this time, physical therapy has been discontinued and patient has been discharged from care.  The last therapy note is below for review.    Please feel free to contact me with any questions regarding the care of [facility].    Sincerely,    Saralyn Pilar, PT, DPT       09/24/2018                 Precautions: depression/anxiety  cardiac  osteopenia/osteoporosis  Allergies: Patient has no known allergies.                      ---      ---   Total Time   Timed Minutes  43 minutes   Total Time  43 minutes           Goals    Goal 1:  Pt will independently perform their home exercise program to maintain gains between PT visits.       Rolling pin L quad: x 5'  1x/day  Peroneal nerve glide in supine: 2x10, 2x/day  LTR: 2x10, 2x/day     Sessions:  3      Goal 2:  Patient will improve LE strength in all deficient planes by >= 1/2 grade to facilitate improved ability to negotiate 1 FOS using reciprocal pattern without difficulty.    Sessions:  8      Goal 3:  Pt will demonstrate forward flexion AROM to at least 60 degrees degrees to allow them to move forward to reach for objects on the floor.     Sessions:  16                                   Saralyn Pilar, PT

## 2018-10-03 ENCOUNTER — Other Ambulatory Visit (INDEPENDENT_AMBULATORY_CARE_PROVIDER_SITE_OTHER): Payer: Self-pay | Admitting: Internal Medicine

## 2018-10-03 DIAGNOSIS — K2270 Barrett's esophagus without dysplasia: Secondary | ICD-10-CM

## 2018-10-08 ENCOUNTER — Ambulatory Visit (INDEPENDENT_AMBULATORY_CARE_PROVIDER_SITE_OTHER): Payer: Medicare Other | Admitting: Internal Medicine

## 2018-10-13 ENCOUNTER — Encounter (INDEPENDENT_AMBULATORY_CARE_PROVIDER_SITE_OTHER): Payer: Self-pay

## 2018-10-22 ENCOUNTER — Other Ambulatory Visit (INDEPENDENT_AMBULATORY_CARE_PROVIDER_SITE_OTHER): Payer: Self-pay | Admitting: Internal Medicine

## 2018-10-22 DIAGNOSIS — E785 Hyperlipidemia, unspecified: Secondary | ICD-10-CM

## 2018-10-29 ENCOUNTER — Other Ambulatory Visit (INDEPENDENT_AMBULATORY_CARE_PROVIDER_SITE_OTHER): Payer: Self-pay | Admitting: Internal Medicine

## 2018-10-29 DIAGNOSIS — K2270 Barrett's esophagus without dysplasia: Secondary | ICD-10-CM

## 2018-11-01 ENCOUNTER — Ambulatory Visit (INDEPENDENT_AMBULATORY_CARE_PROVIDER_SITE_OTHER): Payer: Medicare Other | Admitting: Internal Medicine

## 2018-11-12 ENCOUNTER — Encounter (INDEPENDENT_AMBULATORY_CARE_PROVIDER_SITE_OTHER): Payer: Self-pay

## 2018-11-21 ENCOUNTER — Other Ambulatory Visit (INDEPENDENT_AMBULATORY_CARE_PROVIDER_SITE_OTHER): Payer: Self-pay | Admitting: Internal Medicine

## 2018-11-21 DIAGNOSIS — K2270 Barrett's esophagus without dysplasia: Secondary | ICD-10-CM

## 2018-12-08 ENCOUNTER — Other Ambulatory Visit (INDEPENDENT_AMBULATORY_CARE_PROVIDER_SITE_OTHER): Payer: Self-pay | Admitting: Internal Medicine

## 2018-12-08 DIAGNOSIS — K2270 Barrett's esophagus without dysplasia: Secondary | ICD-10-CM

## 2018-12-08 MED ORDER — OMEPRAZOLE 20 MG PO CPDR
20.00 mg | DELAYED_RELEASE_CAPSULE | Freq: Every day | ORAL | 0 refills | Status: DC | PRN
Start: 2018-12-08 — End: 2019-03-03

## 2018-12-08 NOTE — Telephone Encounter (Signed)
Pharmacy is requesting a refill for omeprazole

## 2018-12-13 ENCOUNTER — Encounter (INDEPENDENT_AMBULATORY_CARE_PROVIDER_SITE_OTHER): Payer: Self-pay

## 2019-01-21 ENCOUNTER — Other Ambulatory Visit (INDEPENDENT_AMBULATORY_CARE_PROVIDER_SITE_OTHER): Payer: Self-pay | Admitting: Internal Medicine

## 2019-01-21 DIAGNOSIS — E785 Hyperlipidemia, unspecified: Secondary | ICD-10-CM

## 2019-02-04 ENCOUNTER — Encounter (INDEPENDENT_AMBULATORY_CARE_PROVIDER_SITE_OTHER): Payer: Self-pay | Admitting: Family

## 2019-02-04 ENCOUNTER — Ambulatory Visit (INDEPENDENT_AMBULATORY_CARE_PROVIDER_SITE_OTHER): Payer: Medicare Other | Admitting: Family

## 2019-02-04 VITALS — BP 106/64 | HR 66 | Temp 98.4°F | Resp 12 | Ht 58.7 in | Wt 109.0 lb

## 2019-02-04 DIAGNOSIS — Z01818 Encounter for other preprocedural examination: Secondary | ICD-10-CM

## 2019-02-04 LAB — HEMOLYSIS INDEX: Hemolysis Index: 28 — ABNORMAL HIGH (ref 0–18)

## 2019-02-04 LAB — CBC AND DIFFERENTIAL
Absolute NRBC: 0 10*3/uL (ref 0.00–0.00)
Basophils Absolute Automated: 0.02 10*3/uL (ref 0.00–0.08)
Basophils Automated: 0.4 %
Eosinophils Absolute Automated: 0.07 10*3/uL (ref 0.00–0.44)
Eosinophils Automated: 1.4 %
Hematocrit: 40.4 % (ref 34.7–43.7)
Hgb: 12.7 g/dL (ref 11.4–14.8)
Immature Granulocytes Absolute: 0.01 10*3/uL (ref 0.00–0.07)
Immature Granulocytes: 0.2 %
Lymphocytes Absolute Automated: 1.27 10*3/uL (ref 0.42–3.22)
Lymphocytes Automated: 25.7 %
MCH: 30.2 pg (ref 25.1–33.5)
MCHC: 31.4 g/dL — ABNORMAL LOW (ref 31.5–35.8)
MCV: 96 fL (ref 78.0–96.0)
MPV: 12.7 fL — ABNORMAL HIGH (ref 8.9–12.5)
Monocytes Absolute Automated: 0.42 10*3/uL (ref 0.21–0.85)
Monocytes: 8.5 %
Neutrophils Absolute: 3.16 10*3/uL (ref 1.10–6.33)
Neutrophils: 63.8 %
Nucleated RBC: 0 /100 WBC (ref 0.0–0.0)
Platelets: 142 10*3/uL (ref 142–346)
RBC: 4.21 10*6/uL (ref 3.90–5.10)
RDW: 12 % (ref 11–15)
WBC: 4.95 10*3/uL (ref 3.10–9.50)

## 2019-02-04 LAB — BASIC METABOLIC PANEL
Anion Gap: 8 (ref 5.0–15.0)
BUN: 11 mg/dL (ref 7.0–19.0)
CO2: 27 mEq/L (ref 21–29)
Calcium: 8.7 mg/dL (ref 7.9–10.2)
Chloride: 105 mEq/L (ref 100–111)
Creatinine: 0.7 mg/dL (ref 0.4–1.5)
Glucose: 86 mg/dL (ref 70–100)
Potassium: 3.9 mEq/L (ref 3.5–5.1)
Sodium: 140 mEq/L (ref 136–145)

## 2019-02-04 LAB — GFR: EGFR: >60

## 2019-02-04 NOTE — Progress Notes (Signed)
Subjective:      Michele [name] is a 78 y.o. female who presents to the office today for a preoperative consultation at the request of surgeon Dr. Janee Morn who plans on performing L medial menisectomy  on August 12. This consultation is requested for the specific conditions prompting preoperative evaluation (i.e. because of potential affect on operative risk): Marland Kitchen Planned anesthesia is general. The patient has the following known anesthesia issues: none. Patient has a bleeding risk of: no recent abnormal bleeding. Patient does not have objections to receiving blood products if needed.    The following portions of the patient's history were reviewed and updated as appropriate: allergies, current medications, past family history, past medical history, past social history, past surgical history and problem list.  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    COPD (chronic obstructive pulmonary disease) 11/26/2015    Tobacco abuse 03/05/2016    Hoffman sign present 09/12/2016    Hyperlipidemia 04/28/2018     Resolved Ambulatory Problems     Diagnosis Date Noted    Thrombocytopenia 02/18/2013    Midline low back pain without sciatica 09/18/2014    Generalized weakness 06/16/2016    Muscle weakness 07/14/2016    Bilateral low back pain with left-sided sciatica, unspecified chronicity 06/22/2018     Past Medical History:   Diagnosis Date    Abnormal EKG     Arthralgia     Barrett's esophagus     Cervical spinal stenosis     Colonic polyp     Depression     Disc disorder     Gastroesophageal reflux disease     Lower back pain     Numbness and tingling of both legs 02/06/2015    Osteoarthritis      Past Surgical History:   Procedure Laterality Date    APPENDECTOMY   1960?    when was a teenager    TUBAL LIGATION  1973     Current Outpatient Medications on File Prior to Visit   Medication Sig Dispense Refill    aspirin EC 81 MG EC tablet Take by mouth.      atorvastatin (LIPITOR) 10 MG tablet TAKE 1 TABLET BY MOUTH EVERY DAY 90 tablet 0    omeprazole (PRILOSEC) 20 MG capsule Take 1 capsule (20 mg total) by mouth daily as needed (GERD) 90 capsule 0    tobramycin-dexamethasone (TOBRADEX) ophthalmic solution INSTILL 1 DROP INTO BOTH EYES TWICE A DAY      Calcium Carbonate (CALCIUM 600 PO) Take 1 tablet by mouth daily.          VITAMIN E PO Take by mouth daily       No current facility-administered medications on file prior to visit.        Review of Systems  Constitutional: negative  Eyes: negative  Ears, nose, mouth, throat, and face: negative  Respiratory: negative  Cardiovascular: negative  Gastrointestinal: negative  Genitourinary:negative  Integument/breast: negative  Hematologic/lymphatic: negative  Musculoskeletal:positive for arthralgias and back pain, negative for muscle weakness and neck pain  Neurological: negative  Behavioral/Psych: negative  Endocrine: negative  Allergic/Immunologic: negative       Objective:      Physical Exam  BP 106/64 (BP Site:  Left arm, Patient Position: Sitting, Cuff Size: Small)    Pulse 66    Temp 98.4 F (36.9 C) (Oral)    Resp 12    Ht 1.491 m (4' 10.7")    Wt 49.4 kg (109 lb)    SpO2 96%    BMI 22.24 kg/m     General Appearance:    Alert, cooperative, no distress, appears stated age   Head:    Normocephalic, without obvious abnormality, atraumatic   Eyes:    PERRL,, EOM's intact both eyes, left eye conjunctival redness   Ears:    Normal TM's and external ear canals, both ears   Nose:   Nares normal, septum midline, mucosa normal, no drainage    or sinus tenderness   Throat:   Lips, mucosa, and tongue normal; teeth and gums normal   Neck:   Supple, symmetrical, trachea midline, no adenopathy;     thyroid:  no  enlargement/tenderness/nodules; no carotid    bruit or JVD   Back:     Symmetric, no curvature, ROM normal, no CVA tenderness   Lungs:     Clear to auscultation bilaterally, respirations unlabored   Chest Wall:    No tenderness or deformity    Heart:    Regular rate and rhythm, S1 and S2 normal, no murmur, rub   or gallop   Breast Exam:    Not indicated   Abdomen:     Soft, non-tender, bowel sounds active all four quadrants,     no masses, no organomegaly   Genitalia:  Not indicated   Rectal:  Not indicated   Extremities:   Extremities normal, atraumatic, no cyanosis or edema   Pulses:   2+ and symmetric all extremities   Skin:   Skin color, texture, turgor normal, no rashes or lesions   Lymph nodes:   Cervical, supraclavicular, and axillary nodes normal   Neurologic:   CNII-XII intact, normal strength, sensation and reflexes     throughout       Predictors of intubation difficulty:   Morbid obesity? no   Anatomically abnormal facies? no   Prominent incisors? no   Receding mandible? no   Short, thick neck? no   Neck range of motion: normal   Mallampati score: III (soft and hard palate and base of uvula visible)      Dentition: No chipped, loose, or missing teeth.    Cardiographics  ECG: normal sinus rhythm, no blocks or conduction defects, no ischemic changes  Echocardiogram: not done          Lab Review   BMP, and CBC     Assessment:      78 y.o. female with planned surgery as above.    Known risk factors for perioperative complications: None    Difficulty with intubation is not anticipated.  PT IS CLEARED FOR THE PLANNED SURGERY.    Cardiac Risk Estimation: per the Revised Cardiac Risk Index (Circ. 100:1043, 1999), the patient's risk factors for cardiac complications include none, putting her in: RCI RISK CLASS I (0 risk factors, risk of major cardiac compl. appr. 0.5%)    Current medications which may produce withdrawal symptoms if withheld perioperatively: none.       Plan:      1. Preoperative workup as follows  EKG, CBC and BMP  2. Change in medication regimen before surgery: none  3. Prophylaxis for cardiac events with perioperative beta-blockers: not indicated.  4. Invasive hemodynamic monitoring perioperatively: not indicated.  5.  Deep vein thrombosis prophylaxis postoperatively:regimen to be chosen by surgical team.  6. Surveillance for postoperative MI with ECG immediately postoperatively and on postoperative days 1 and 2 AND troponin levels 24 hours postoperatively and on day 4 or hospital discharge (whichever comes first): not indicated.  7. Other measures: none

## 2019-02-04 NOTE — Progress Notes (Signed)
Have you seen any specialists/other providers since your last visit with [place]?    Yes Ophthalmology    Arm preference verified?   Yes    The patient is due for spirometry, shingles vaccine, pneumonia vaccine and AD

## 2019-02-07 ENCOUNTER — Encounter (INDEPENDENT_AMBULATORY_CARE_PROVIDER_SITE_OTHER): Payer: Self-pay | Admitting: Family

## 2019-02-10 ENCOUNTER — Telehealth: Payer: Self-pay | Admitting: Internal Medicine

## 2019-02-10 NOTE — Telephone Encounter (Signed)
Patient called regarding recent results for blood work. Please return call at noted phone number below.      Patient Preferred Callback Number: [phone]

## 2019-02-11 NOTE — Telephone Encounter (Signed)
Patient informed of lab results. V/u.

## 2019-02-13 ENCOUNTER — Ambulatory Visit (FREE_STANDING_LABORATORY_FACILITY): Payer: Medicare Other

## 2019-02-13 DIAGNOSIS — Z01818 Encounter for other preprocedural examination: Secondary | ICD-10-CM

## 2019-02-14 LAB — COVID-19 (SARS-COV-2): SARS CoV 2 Overall Result: NOT DETECTED

## 2019-02-17 IMAGING — CR DG KNEE COMPLETE 4+V*L*
4 series · 4 of 4 positions shown · non-contrast
Comparison: None.

CLINICAL DATA: Trip and fall injury while crossing history tonight.

EXAM:
LEFT KNEE - COMPLETE 4+ VIEW

[t knee ap left]
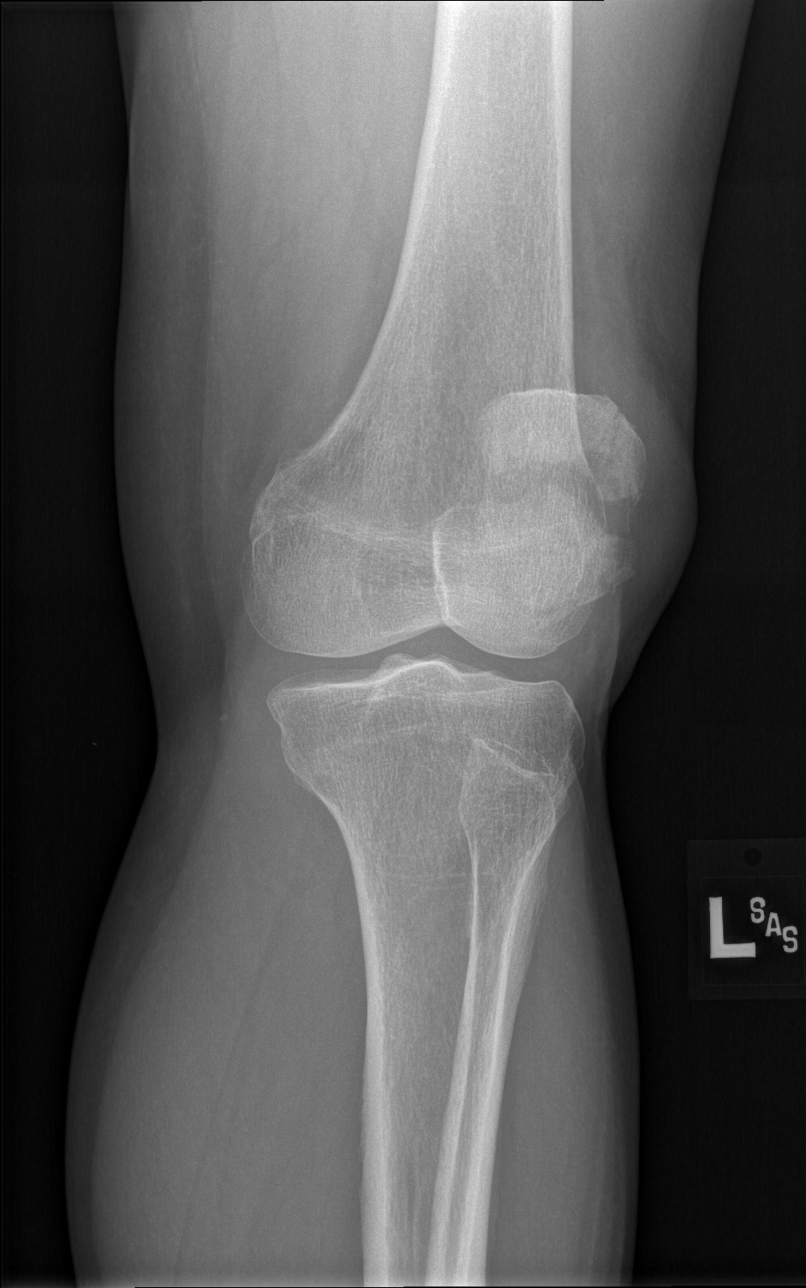

[t knee obl left (1 of 2)]
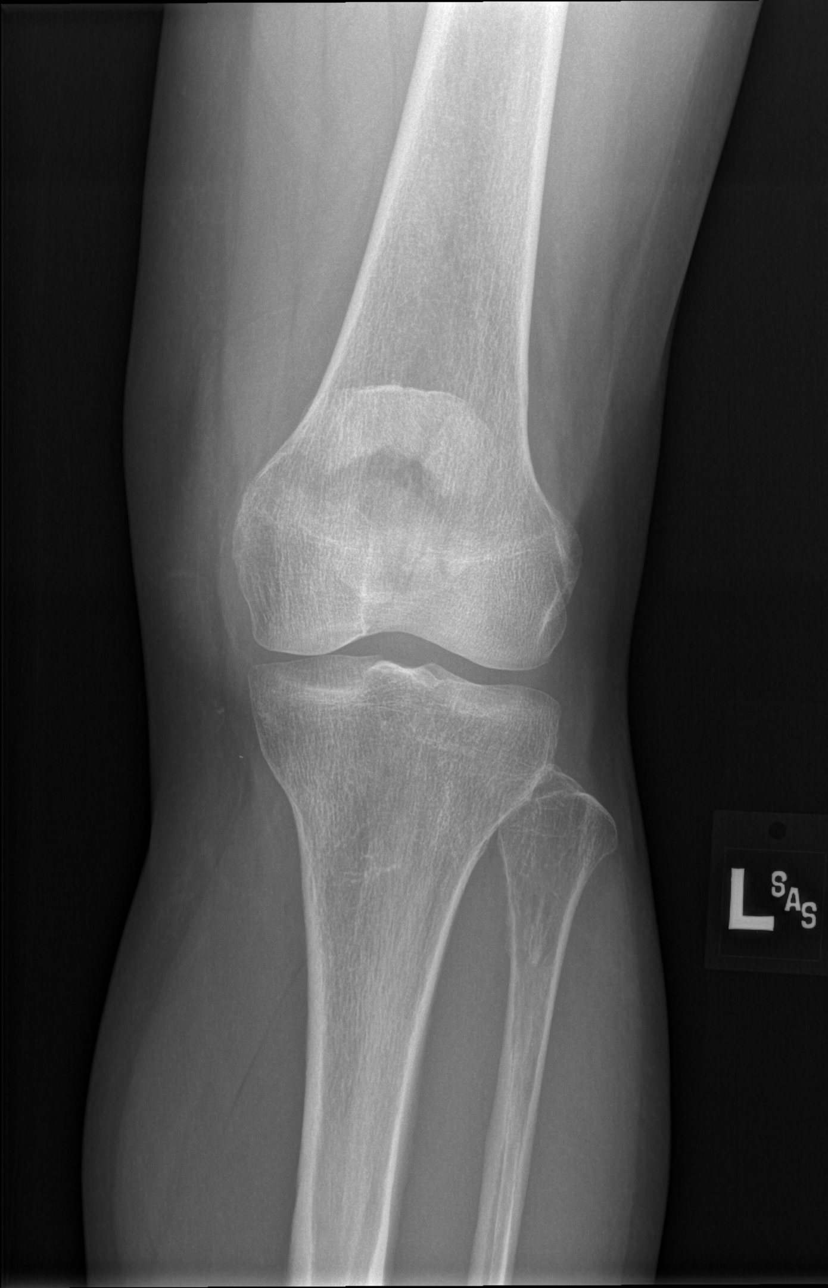

[t knee obl left (2 of 2)]
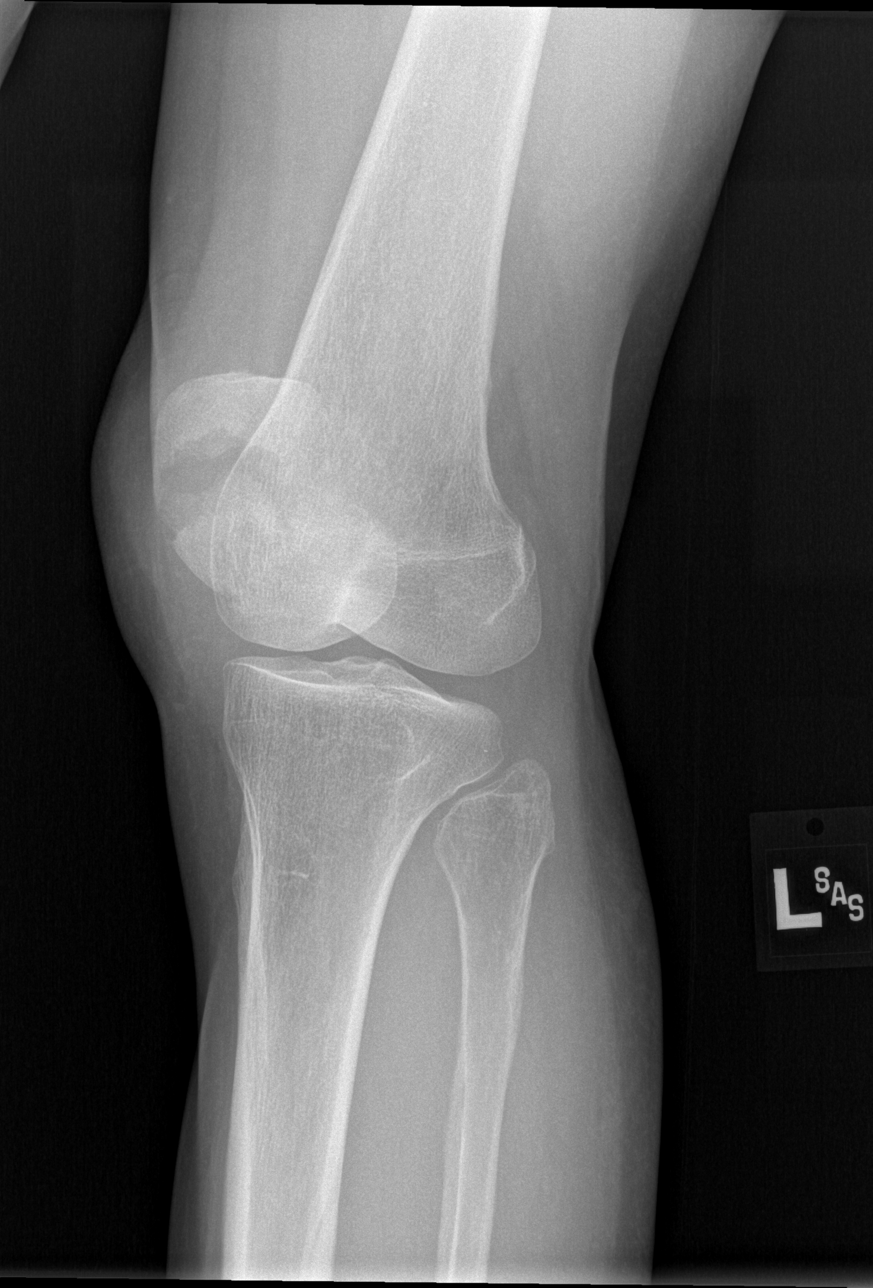

[x knee lat left]
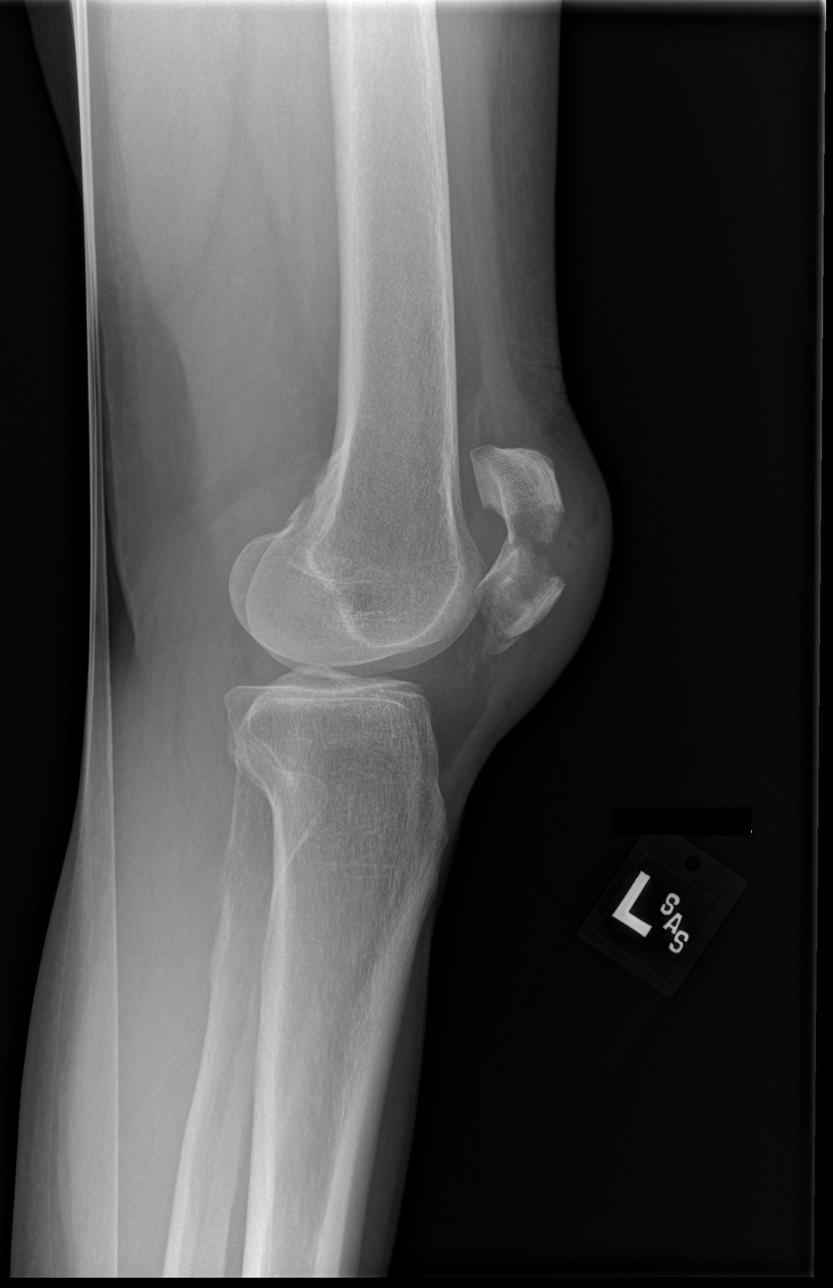

[4 of 4 positions shown; findings below may reference images not displayed]

FINDINGS: There is a moderately distracted patellar fracture, oriented
transverse through the midpole. Marked prepatellar soft tissue
swelling. Femur and tibia are intact.
IMPRESSION: Patellar fracture

## 2019-03-03 ENCOUNTER — Other Ambulatory Visit (INDEPENDENT_AMBULATORY_CARE_PROVIDER_SITE_OTHER): Payer: Self-pay | Admitting: Internal Medicine

## 2019-03-03 DIAGNOSIS — K2270 Barrett's esophagus without dysplasia: Secondary | ICD-10-CM

## 2019-03-09 ENCOUNTER — Other Ambulatory Visit: Payer: Self-pay | Admitting: Orthopaedic Surgery

## 2019-03-09 ENCOUNTER — Encounter (INDEPENDENT_AMBULATORY_CARE_PROVIDER_SITE_OTHER): Payer: Self-pay

## 2019-03-09 DIAGNOSIS — Z9889 Other specified postprocedural states: Secondary | ICD-10-CM

## 2019-03-09 DIAGNOSIS — S83232A Complex tear of medial meniscus, current injury, left knee, initial encounter: Secondary | ICD-10-CM

## 2019-03-23 ENCOUNTER — Inpatient Hospital Stay: Payer: Medicare Other | Attending: Orthopaedic Surgery | Admitting: Rehabilitative and Restorative Service Providers"

## 2019-03-23 VITALS — BP 117/71

## 2019-03-23 DIAGNOSIS — Z4889 Encounter for other specified surgical aftercare: Secondary | ICD-10-CM | POA: Insufficient documentation

## 2019-03-23 DIAGNOSIS — S83232D Complex tear of medial meniscus, current injury, left knee, subsequent encounter: Secondary | ICD-10-CM | POA: Insufficient documentation

## 2019-03-23 DIAGNOSIS — X58XXXD Exposure to other specified factors, subsequent encounter: Secondary | ICD-10-CM | POA: Insufficient documentation

## 2019-03-23 NOTE — Progress Notes (Signed)
Name:Michele [name] Age: 78 y.o.   Date of Service: 03/23/2019  Referring Physician: Marilynn Latino, MD   Date of Injury: 02/07/2019  Date Care Plan Established/Reviewed: 03/23/2019  Date Treatment Started: 03/23/2019  End of Certification Date: 06/20/2019  Sessions in Plan of Care: 16  Surgery Date: 02/07/2019      Visit Count: 1   Diagnosis:   1. Complex tear of medial meniscus of left knee as current injury, subsequent encounter    2. Other specified aftercare following surgery        Subjective     History of Present Illness   History of Present Illness: Patient is coming to therapy after having a partial medial L knee menisectomy on August 12th, 2020. Patient fell a few years ago and fractured her L patella which caused the hardware from a L TKR to be dislodged. Since then she had pain to her L knee. Patient reports difficulty ambulating, negotiating stairs, straightening her L knee, and bending her knee. She reports decreased pain to her L knee when using ice on it. Patient denies any numbness or tingling to her L knee. Patient denies any constitutional signs or symptoms.   Functional Limitations (PLOF): Patient is unable to negotiate one flight of stairs reciprocally. (Pt was able to negotiate multiple flights of stairs).   Patient is unable to sleep for more than 3 hours. (Pt was able to sleep for 7-8 hours a night).   Patient is unable to ambulate 10 minutes when running errands. (Pt was able to ambulate 1 hour when running errands).     Outcome Measure   Tool Used/Details: Foto  Score: 40  Predicted Functional Outcome: 64    Pain   Current pain rating: 6  At best pain rating: 6  At worst pain rating: 10  Location: R medial knee, patella, and along incision points.     Social Support/Occupation  Lives in: multiple level home  Lives with: adult children  Occupation: Retired      Precautions: No data was found  Allergies: Patient has no known allergies.    Past Medical History:   Diagnosis Date    Abnormal EKG      Arthralgia     Barrett's esophagus     Cervical spinal stenosis     Colonic polyp     Depression     Disc disorder     Gastroesophageal reflux disease     Lower back pain     Numbness and tingling of both legs 02/06/2015    Osteoarthritis     Osteopenia        Objective   L antalgic gait, decreased L heel strike during stance phase, decreased L knee extensionand increased L knee valgus.     Integumentary   surgical incisions healing without signs of infection    Range of Motion     03/23/2019   Left AROM    Left PROM   Knee 03/23/2019   Right AROM    Right PROM   125 pain   Flexion 139      -2    Extension 0      (blank fields were intentionally left blank)        Strength     03/23/2019   Left Strength  Knee  MMT 03/23/2019   Right   5  Knee Flexion 5    4  Knee Extension 5-    (blank fields were intentionally left blank)  Palpation 03/23/2019 Mod TTP L popliteus and medial hamstrings    Patellar Mobility   Left Knee Hypomobile in the left medial and left lateral patellar tendon(s).     Right Knee Patellar tendons within functional limits include the medial and lateral.     Neurological Testing     Sensation     Knee   Left Knee   Intact: light touch    Right Knee   Intact: light touch       BP: 117/71      Treatment     Therapeutic Exercises   Justification: To increase strength and ROM.   HEP reviewed to ensure patient compliance     Supine Active hamstring stretch 20 x 5 secs on L LE with verbal cuing on correct hand placement     Sitting Quad Sets 20 x 5 secs on R LE with verbal cuing to drive heel into the ground    Manual Therapy   Justification: To improve joint mobility and reduce soft tissue restrictions.  Strumming to L anterior incision point, L popliteus, and L medial hamstrings with patient in supine       ---      ---   Total Time   Timed Minutes  23 minutes   Untimed Minutes  20 minutes   Total Time  43 minutes        Assessment   Michele [name] is a 78 y.o. female presenting with L knee  menisectomy who requires Physical Therapy for the following:  Impairments:   IPTC Impairments: Pain that limits and interferes with functional ability  Decreased range of motion  Decreased strength  Swelling/Edema/Effusion  Decreased functional stability  Decreased joint mobility  Decreased soft tissue mobility  Decreased/impaired motor control  Decreased static balance  Decreased dynamic balance    Pain located: R medial Knee and along incision points    Clinical presentation: stable - normal post-operative recovery (no infections/incisions healing/average swelling)  Barriers to therapy: No barriers to rehab.   Functional Limitations (PLOF): Patient is unable to negotiate one flight of stairs reciprocally. (Pt was able to negotiate multiple flights of stairs).   Patient is unable to sleep for more than 3 hours. (Pt was able to sleep for 7-8 hours a night).   Patient is unable to ambulate 10 minutes when running errands. (Pt was able to ambulate 1 hour when running errands).   Prognosis: excellent  Plan   Visits per week: 2  Number of Sessions: 16  Direct One on One  16109: Therapeutic Exercise: To Develop Strength and Endurance, ROM and Flexibility  [id number]: Gait Training  60454: Neuromuscular Reeducation (Proprioceptive Neuromuscular Faciliation)  97140: Manual Therapy techniques (mobilization, manipulation, manual traction) (Grade I-V to patellofemoral joint, tibiofemoral joint, ankle, hip, and regionally interdependent joints, soft tissue mobilization, instrument assisted soft tissue mobilization.)  97530: Therapeutic Activities: Dynamic activities to improve functional performance  Dry Needling  Supervised Modalities  97010: Thermal modalities: hot/cold packs  09811: Mechnical traction  97014: Electrical stimulation    Goals    Goal 1:  Patient will demonstrate independence in prescribed HEP with proper form, sets and reps for safe discharge to an independent program.   Sessions:  16      Goal 2:  Increase knee  flexion AROM to 140 degrees to allow patient to safely negotiate stairs reciprocally with railing.   Sessions:  10      Goal 3:  Pt will demonstrate increase in FOTO score to predicted  score of 64 to return to PLOF     FOTO score at IE = 40 to be able to sleep 7-8 hours.      Sessions:  14                                   Francis Dowse, DPT

## 2019-03-24 ENCOUNTER — Inpatient Hospital Stay: Payer: Medicare Other | Attending: Orthopaedic Surgery | Admitting: Rehabilitative and Restorative Service Providers"

## 2019-03-24 DIAGNOSIS — S83232D Complex tear of medial meniscus, current injury, left knee, subsequent encounter: Secondary | ICD-10-CM | POA: Insufficient documentation

## 2019-03-24 DIAGNOSIS — Z4889 Encounter for other specified surgical aftercare: Secondary | ICD-10-CM | POA: Insufficient documentation

## 2019-03-24 NOTE — PT/OT Therapy Note (Signed)
Name: Michele [name] Age: 78 y.o.   Date of Service: 03/24/2019  Referring Physician: Marilynn Latino, MD   Date of Injury: 02/07/2019  Date Care Plan Established/Reviewed: 03/23/2019  Date Treatment Started: 03/23/2019  End of Certification Date: 06/20/2019  Sessions in Plan of Care: 16  Surgery Date: 02/16/2019    Visit Count: 2   Diagnosis:   1. Complex tear of medial meniscus of left knee as current injury, subsequent encounter    2. Other specified aftercare following surgery        Subjective     Social Support/Occupation  Lives in: multiple level home  Lives with: adult children  Occupation: Retired    Patient reports that her L knee is bothering her. She notes the most discomfort when lying down.       Precautions: No data was found  Allergies: Patient has no known allergies.                Treatment     Therapeutic Exercises   Justification: To increase strength and ROM.   Supine Active hamstring stretch 20 x 5 secs on L LE with verbal cuing on correct hand placement     SLR Abduction B sides 3 x 10 with verbal cuing to avoid going into increased thoracolumbar rotation    HEP reviewed to ensure patient compliance    Neuromuscular Re-Education   Justification: To improve B hip/core muscle activation  Single Leg stance on B LEs 2 x for 1 min each with verbal cuing to avoid going into B compensated Trendelenburg position     Leg Press 70lb 3 x 10 with verbal cuing to engage B glute muscles and L VMO activation    Manual Therapy   Justification: To improve joint mobility and reduce soft tissue restrictions.  Strumming to L anterior incision point, L popliteus, and L medial hamstrings with patient in supine     Grade I/II posterior L tibiofemoral joint mobs in supine    Therapeutic Activity   Justification: To educate patient on injury and injury prevention.   Patient instructed how to position her pillows at night by reducing spacing on her legs and cervical spine region.     Pt educated on how to modify ADLs to  reduced pain and discomfort to L knee.        ---      ---   Total Time   Timed Minutes  38 minutes   Total Time  38 minutes        Assessment   Patient is noted to have tenderness along her incision point and patellar tendon. This improved after performing STM to this area. Patient is noted to have L VMO weakness and hip weakness as noted when she was performing her exercises. Patient was given an HEP at the end of ehr tx session.   Plan   Treatment sessions will focus on improving L scar tissue fibrosity and increased L quadriceps strength.       Goals    Goal 1: Patient will demonstrate independence in prescribed HEP with proper form, sets and reps for safe discharge to an independent program.     03/24/2019 MJM    Access Code: [email or web address]   URL: [email or web address]   Date: 03/24/2019   Prepared by: [name]      Exercises Hooklying Active Hamstring Stretch - 10 reps - 3 sets - 1x daily - 7x weekly   Long Sitting Quad Set - 10 reps -  3 sets - 5 hold - 1x daily - 7x weekly   Sidelying Hip Abduction - 10 reps - 3 sets - 1x daily - 7x weekly   Single Leg Stance - 3 reps - 1 sets - 60sec hold - 1x daily - 7x weekly      Sessions: 16      Goal 2: Increase knee flexion AROM to 140 degrees to allow patient to safely negotiate stairs reciprocally with railing.   Sessions: 10      Goal 3: Pt will demonstrate increase in FOTO score to predicted score of 64 to return to PLOF     FOTO score at IE = 40 to be able to sleep 7-8 hours.      Sessions: 14                                   Francis Dowse, DPT

## 2019-03-25 ENCOUNTER — Other Ambulatory Visit: Payer: Self-pay | Admitting: Orthopaedic Surgery

## 2019-03-25 ENCOUNTER — Ambulatory Visit
Admission: RE | Admit: 2019-03-25 | Discharge: 2019-03-25 | Disposition: A | Discharge status: Home / Self Care | Payer: Medicare Other | Source: Ambulatory Visit | Attending: Orthopaedic Surgery | Admitting: Orthopaedic Surgery

## 2019-03-25 DIAGNOSIS — S83232D Complex tear of medial meniscus, current injury, left knee, subsequent encounter: Secondary | ICD-10-CM

## 2019-03-25 DIAGNOSIS — S83232A Complex tear of medial meniscus, current injury, left knee, initial encounter: Secondary | ICD-10-CM

## 2019-03-25 DIAGNOSIS — M1712 Unilateral primary osteoarthritis, left knee: Secondary | ICD-10-CM | POA: Insufficient documentation

## 2019-03-25 DIAGNOSIS — T8484XA Pain due to internal orthopedic prosthetic devices, implants and grafts, initial encounter: Secondary | ICD-10-CM

## 2019-03-25 DIAGNOSIS — X58XXXA Exposure to other specified factors, initial encounter: Secondary | ICD-10-CM | POA: Insufficient documentation

## 2019-03-25 DIAGNOSIS — S82142A Displaced bicondylar fracture of left tibia, initial encounter for closed fracture: Secondary | ICD-10-CM | POA: Insufficient documentation

## 2019-03-25 DIAGNOSIS — Z9889 Other specified postprocedural states: Secondary | ICD-10-CM

## 2019-03-25 DIAGNOSIS — M67864 Other specified disorders of tendon, left knee: Secondary | ICD-10-CM | POA: Insufficient documentation

## 2019-03-25 DIAGNOSIS — S83242A Other tear of medial meniscus, current injury, left knee, initial encounter: Secondary | ICD-10-CM | POA: Insufficient documentation

## 2019-03-29 ENCOUNTER — Telehealth (INDEPENDENT_AMBULATORY_CARE_PROVIDER_SITE_OTHER): Payer: Self-pay | Admitting: Internal Medicine

## 2019-03-29 NOTE — Telephone Encounter (Signed)
Pt is looking to see if she can get an order for physical therapy for her back.    She is looking to see if she may get the order without an appt, She did schedule to see Hamiyet, NP for next week      Please advise

## 2019-03-31 NOTE — Telephone Encounter (Signed)
Left message for patient to return call.

## 2019-04-04 ENCOUNTER — Ambulatory Visit (INDEPENDENT_AMBULATORY_CARE_PROVIDER_SITE_OTHER): Payer: Medicare Other | Admitting: Family

## 2019-04-04 ENCOUNTER — Encounter (INDEPENDENT_AMBULATORY_CARE_PROVIDER_SITE_OTHER): Payer: Self-pay | Admitting: Family

## 2019-04-04 VITALS — BP 108/65 | HR 85 | Temp 98.3°F | Resp 16 | Ht 58.47 in | Wt 107.4 lb

## 2019-04-04 DIAGNOSIS — R251 Tremor, unspecified: Secondary | ICD-10-CM

## 2019-04-04 DIAGNOSIS — M5440 Lumbago with sciatica, unspecified side: Secondary | ICD-10-CM

## 2019-04-04 DIAGNOSIS — G8929 Other chronic pain: Secondary | ICD-10-CM

## 2019-04-04 NOTE — Progress Notes (Signed)
Subjective:      Patient ID: Michele [name] is a 78 y.o. female     Chief Complaint   Patient presents with    Tremors     body        HPI   Pt has been having whole body tremors for more than 4-5 years. It is gradually getting worse.She was evaluated by neurology  On 11/13/2016 She states that she was referred to neurosurgery which she did not follow up.Pt states that when she fell and hurt her left knee (fracture of left patella), she focused on knee.She had surgery on 02/04/19 for her left knee. She states that her ortho and PT told her to have another evaluation as her body tremors are getting worse.    She admits that at times she is having difficulty with her balance. Able to to her ADLs.  Additionally she has lower back pain, burning and tingling of lower extremities. She is currently doing PT for her kneebut she would like to do      The following sections were reviewed this encounter by the provider:   Tobacco   Allergies   Problems   Med Hx   Surg Hx   Fam Hx          Review of Systems   Constitutional: Negative for chills, fatigue and fever.   Genitourinary: Negative for flank pain.   Musculoskeletal: Positive for arthralgias. Negative for back pain, gait problem, myalgias, neck pain and neck stiffness.   Neurological: Positive for tremors. Negative for dizziness, seizures, syncope, facial asymmetry, speech difficulty, weakness, light-headedness, numbness and headaches.          BP 108/65 (BP Site: Right arm, Patient Position: Sitting, Cuff Size: Small)    Pulse 85    Temp 98.3 F (36.8 C) (Oral)    Resp 16    Ht 1.485 m (4' 10.47")    Wt 48.7 kg (107 lb 5.8 oz)    SpO2 97%    BMI 22.08 kg/m     Objective:     Physical Exam  Vitals signs and nursing note reviewed.   Constitutional:       Appearance: Normal appearance. She is well-developed.   HENT:      Head: Normocephalic and atraumatic.   Neck:      Musculoskeletal: Normal range of motion and neck supple.   Cardiovascular:      Rate and Rhythm: Normal  rate and regular rhythm.      Heart sounds: Normal heart sounds.   Pulmonary:      Effort: Pulmonary effort is normal.      Breath sounds: Normal breath sounds.   Musculoskeletal:         General: No deformity.      Lumbar back: She exhibits decreased range of motion and pain. She exhibits no tenderness, no bony tenderness, no swelling, no edema, no deformity and no laceration.        Back:       Comments: Involuntary, gross body movement that includes upper and lower extremities    Skin:     General: Skin is warm and dry.   Neurological:      General: No focal deficit present.      Mental Status: She is alert and oriented to person, place, and time.   Psychiatric:         Mood and Affect: Mood normal.         Behavior: Behavior normal.  Thought Content: Thought content normal.         Judgment: Judgment normal.          Assessment:     1. Shakiness  - Neurology Referral: Marcos Eke. Lance Coon, MD Mackie Pai)    2. Chronic left-sided low back pain with sciatica, sciatica laterality unspecified  - Referral to Physical Therapy-[facility]        Plan:     1) Chronic, worsening  Pt states that she would like to get a second opinion with another neurologist even though she was evaluated approximately two years ago by neurology.    2) Chronic, stable  Referral to PT is provided      Medication list reviewed with patient and updated as indicated.  Risk & Benefits of any new medication(s) were explained to the patient who verbalized understanding & agreed to the treatment plan.  Patient given a printed copy of AVS. Patient verbalized understanding of instructions given.        Mariachristina Holle Berna Spare, NP

## 2019-04-04 NOTE — Progress Notes (Signed)
Have you seen any specialists/other providers since your last visit with [place]?    Yes  Ortho    Arm preference verified?   Yes    The patient is due for spirometry, influenza vaccine, shingles vaccine, pneumonia vaccine and AD

## 2019-04-05 ENCOUNTER — Inpatient Hospital Stay: Payer: Medicare Other | Attending: Orthopaedic Surgery | Admitting: Rehabilitative and Restorative Service Providers"

## 2019-04-05 ENCOUNTER — Telehealth (INDEPENDENT_AMBULATORY_CARE_PROVIDER_SITE_OTHER): Payer: Medicare Other | Admitting: Internal Medicine

## 2019-04-05 DIAGNOSIS — S83232D Complex tear of medial meniscus, current injury, left knee, subsequent encounter: Secondary | ICD-10-CM

## 2019-04-05 DIAGNOSIS — Z4889 Encounter for other specified surgical aftercare: Secondary | ICD-10-CM

## 2019-04-05 NOTE — PT/OT Therapy Note (Signed)
Name: Michele [name] Age: 78 y.o.   Date of Service: 04/05/2019  Referring Physician: Marilynn Latino, MD   Date of Injury: 02/07/2019  Date Care Plan Established/Reviewed: 03/23/2019  Date Treatment Started: 03/23/2019  End of Certification Date: 06/20/2019  Sessions in Plan of Care: 16  Surgery Date: 02/16/2019    Visit Count: 3   Diagnosis:   1. Complex tear of medial meniscus of left knee as current injury, subsequent encounter    2. Other specified aftercare following surgery        Subjective     Social Support/Occupation  Lives in: multiple level home  Lives with: adult children  Occupation: Retired    Patient reports having a sharp pain and tightness to her L knee which can occur regularly. However, patient notes that the pain to her L knee as well as the swelling is going down.       Precautions: No data was found  Allergies: Patient has no known allergies.                Treatment     Therapeutic Exercises   Justification: To increase strength and ROM.   Supine Active hamstring stretch 20 x 5 secs on L LE with verbal cuing on correct hand placement     SLR Abduction B sides 3 x 10 with verbal cuing to avoid going into increased thoracolumbar rotation    HEP reviewed to ensure patient compliance    Neuromuscular Re-Education   Justification: To improve B hip/core muscle activation  Single Leg stance on B LEs 2 x for 1 min each with verbal cuing to avoid going into B compensated Trendelenburg position     Leg Press 90lb 3 x 10 with verbal cuing to engage B glute muscles and L VMO activation    Manual Therapy   Justification: To improve joint mobility and reduce soft tissue restrictions.  Strumming to L anterior incision point, L popliteus, and L medial hamstrings with patient in supine     Grade III posterior L tibiofemoral joint mobs in supine    Therapeutic Activity   Justification: Reviewed sit<>stand mechanics and weight shifting.  Patient went from sit<>stand with sitting with verbal cuing to hip hinge  and weightbear mostly through her B UEs when going from sit<>stand.        ---      ---   Total Time   Timed Minutes  38 minutes   Total Time  38 minutes        Assessment   Patient is noted to have decreased pain with L knee ROM since her last tx session. She had decreased pain after her manual session and after performing L quadriceps strengthening exercises. She required mod verbal cuing to hip hinge correctly when performing sit<>stands. Patient was given an HEP at the end of the treatment session.   Plan   Treatment sessions will continue to focus on reducing L scar tissue fibrosity and increased L VMO strength.       Goals    Goal 1: Patient will demonstrate independence in prescribed HEP with proper form, sets and reps for safe discharge to an independent program.     04/05/2019 MJM    Access Code: [email or web address]   Prepared by: [name]      Active Hamstring Stretch   SLS   SLR Abduction   Leg Press   Sit<>stands     Sessions: 16      Goal 2:  Increase knee flexion AROM to 140 degrees to allow patient to safely negotiate stairs reciprocally with railing.   Sessions: 10      Goal 3: Pt will demonstrate increase in FOTO score to predicted score of 64 to return to PLOF     FOTO score at IE = 40 to be able to sleep 7-8 hours.      Sessions: 14                                   Francis Dowse, DPT

## 2019-04-07 ENCOUNTER — Inpatient Hospital Stay: Payer: Medicare Other | Attending: Orthopaedic Surgery | Admitting: Rehabilitative and Restorative Service Providers"

## 2019-04-07 DIAGNOSIS — S83232D Complex tear of medial meniscus, current injury, left knee, subsequent encounter: Secondary | ICD-10-CM

## 2019-04-07 DIAGNOSIS — Z4889 Encounter for other specified surgical aftercare: Secondary | ICD-10-CM

## 2019-04-07 DIAGNOSIS — X58XXXD Exposure to other specified factors, subsequent encounter: Secondary | ICD-10-CM | POA: Insufficient documentation

## 2019-04-07 NOTE — PT/OT Therapy Note (Signed)
Name: Michele [name] Age: 78 y.o.   Date of Service: 04/07/2019  Referring Physician: Marilynn Latino, MD   Date of Injury: 02/07/2019  Date Care Plan Established/Reviewed: 03/23/2019  Date Treatment Started: 03/23/2019  End of Certification Date: 06/20/2019  Sessions in Plan of Care: 16  Surgery Date: 02/16/2019    Visit Count: 4   Diagnosis:   1. Complex tear of medial meniscus of left knee as current injury, subsequent encounter    2. Other specified aftercare following surgery        Subjective     Social Support/Occupation  Lives in: multiple level home  Lives with: adult children  Occupation: Retired    Patient reports having L medial knee pain. She does not report anything that could have triggered this discomfort. She does note less pain to her L hip.       Precautions: No data was found  Allergies: Patient has no known allergies.    Objective     Range of Motion     04/07/2019   Left AROM    Left PROM   Knee 04/07/2019   Right AROM    Right PROM   132    Flexion       -1    Extension       (blank fields were intentionally left blank)        Strength     04/07/2019   Left Strength  Knee  MMT 04/07/2019   Right     Knee Flexion     4+  Knee Extension     (blank fields were intentionally left blank)             Treatment     Therapeutic Exercises   Justification: To improve B hip/core muscle activation  Supine Active hamstring stretch 20 x 5 secs on L LE with verbal cuing on correct hand placement     SLR Abduction B sides 3 x 10 with verbal cuing to avoid going into increased thoracolumbar rotation    HEP reviewed to ensure patient compliance    Neuromuscular Re-Education   Justification: To improve B hip/core muscle activation  Single Leg stance on B LEs 2 x for 1 min each with verbal cuing to avoid going into B compensated Trendelenburg position     Leg Press 100lb 3 x 10 with verbal cuing to engage B glute muscles and L VMO activation      Manual Therapy   Justification: To improve joint mobility and  reduce soft tissue restrictions.  Strumming to L anterior incision point, L popliteus, and L medial hamstrings with patient in supine     Grade III posterior L tibiofemoral joint mobs in supine    Therapeutic Activity   Justification: Reviewed sit<>stand mechanics and weight shifting.  Patient went from sit<>stand with sitting with verbal cuing to hip hinge and weightbear mostly through her B LEs when going from sit<>stand.     Squat mechanics were reviewed with patient to effectively pick up a heavy item from the floor         ---      ---   Total Time   Timed Minutes  38 minutes   Total Time  38 minutes        Assessment   Patient had increased L knee flexion after performing soft tissue work along her L anterior incision point. Patient was able to have her leg press exercise progressed by adding more weight. Patient  still requires verbal cuing to shift weight further posteriorly when performing sit<>stands.   Plan   Treatment sessions will focus on reducing L medial knee tightness and increased L LE strength. A weight will be added to her sit<>stands for the next treatment session.       Goals    Goal 1: Patient will demonstrate independence in prescribed HEP with proper form, sets and reps for safe discharge to an independent program.     04/05/2019 MJM    Access Code: [email or web address]   Prepared by: [name]      Active Hamstring Stretch   SLS   SLR Abduction   Leg Press   Sit<>stands     Sessions: 16      Goal 2: Increase knee flexion AROM to 140 degrees to allow patient to safely negotiate stairs reciprocally with railing.   Sessions: 10      Goal 3: Pt will demonstrate increase in FOTO score to predicted score of 64 to return to PLOF     FOTO score at IE = 40 to be able to sleep 7-8 hours.      Sessions: 14                                   Francis Dowse, DPT

## 2019-04-12 ENCOUNTER — Inpatient Hospital Stay: Payer: Medicare Other | Attending: Orthopaedic Surgery | Admitting: Rehabilitative and Restorative Service Providers"

## 2019-04-12 DIAGNOSIS — X58XXXD Exposure to other specified factors, subsequent encounter: Secondary | ICD-10-CM | POA: Insufficient documentation

## 2019-04-12 DIAGNOSIS — Z4889 Encounter for other specified surgical aftercare: Secondary | ICD-10-CM

## 2019-04-12 DIAGNOSIS — S83232D Complex tear of medial meniscus, current injury, left knee, subsequent encounter: Secondary | ICD-10-CM | POA: Insufficient documentation

## 2019-04-12 NOTE — PT/OT Therapy Note (Signed)
Name: Adelina Collard Age: 78 y.o.   Date of Service: 04/12/2019  Referring Physician: Marilynn Latino, MD   Date of Injury: 02/07/2019  Date Care Plan Established/Reviewed: 03/23/2019  Date Treatment Started: 03/23/2019  End of Certification Date: 06/20/2019  Sessions in Plan of Care: 16  Surgery Date: 02/16/2019    Visit Count: 5   Diagnosis:   1. Complex tear of medial meniscus of left knee as current injury, subsequent encounter    2. Other specified aftercare following surgery        Subjective     Social Support/Occupation  Lives in: multiple level home  Lives with: adult children  Occupation: Retired    Patient reports that her L knee is feeling better. She notes that she is able to walk for one hour before feeling the discomfort to her L knee.       Precautions: No data was found  Allergies: Patient has no known allergies.                Treatment     Therapeutic Exercises   Justification: To increase strength and ROM.   Supine Active hamstring stretch 20 x 5 secs on L LE with verbal cuing on correct hand placement     SLR Abduction B sides with 1#  3 x 10 with verbal cuing to avoid going into increased thoracolumbar rotation    HEP reviewed to ensure patient compliance    Neuromuscular Re-Education   Justification: To improve B hip/core muscle activation  Single Leg stance on B LEs 2 x for 1 min each with verbal cuing to avoid going into B compensated Trendelenburg position     Single L Leg Press 50lb 3 x 10 with verbal cuing to engage L glute muscles and L VMO activation    Manual Therapy   Justification: To improve joint mobility and reduce soft tissue restrictions.  Strumming to L anterior incision point, L popliteus, and L medial hamstrings with patient in supine     Grade III posterior L tibiofemoral joint mobs in supine    Therapeutic Activity   Justification: Reviewed sit<>stand mechanics and weight shifting.  Patient went from sit<>stand with 5 lbs with sitting with verbal cuing to hip hinge and  weightbear mostly through her B LEs when going from sit<>stand.     Squat mechanics were reviewed with patient to effectively pick up a heavy item from the floor       ---      ---   Total Time   Timed Minutes  38 minutes   Total Time  38 minutes        Assessment   Patient is noted to have increased R weight shifting when performing sit<>stand. Patient was encouraged to perform the exercise at home with a mirror in front of her. She is noted to have decreased L LE muscular endurance when performing a single leg press. Patient is still noted to have difficulty performing a single leg balance.   Plan   Treatment sessions will continue to focus on L LE strengthening. Patient's leg press and squats will continue to be progressed by adding more resistance.       Goals    Goal 1: Patient will demonstrate independence in prescribed HEP with proper form, sets and reps for safe discharge to an independent program.     04/05/2019 MJM    Access Code: [email or web address]   Prepared by: [name]      Active  Hamstring Stretch   SLS   SLR Abduction   Leg Press   Sit<>stands     Sessions: 16      Goal 2: Increase knee flexion AROM to 140 degrees to allow patient to safely negotiate stairs reciprocally with railing.   Sessions: 10      Goal 3: Pt will demonstrate increase in FOTO score to predicted score of 64 to return to PLOF     FOTO score at IE = 40 to be able to sleep 7-8 hours.      Sessions: 14                                   Francis Dowse, DPT

## 2019-04-13 ENCOUNTER — Encounter (INDEPENDENT_AMBULATORY_CARE_PROVIDER_SITE_OTHER): Payer: Self-pay

## 2019-04-14 ENCOUNTER — Inpatient Hospital Stay: Payer: Medicare Other | Attending: Orthopaedic Surgery | Admitting: Rehabilitative and Restorative Service Providers"

## 2019-04-14 DIAGNOSIS — Z4889 Encounter for other specified surgical aftercare: Secondary | ICD-10-CM | POA: Insufficient documentation

## 2019-04-14 DIAGNOSIS — S83232D Complex tear of medial meniscus, current injury, left knee, subsequent encounter: Secondary | ICD-10-CM | POA: Insufficient documentation

## 2019-04-14 NOTE — PT/OT Therapy Note (Signed)
Name: Michele [name] Age: 78 y.o.   Date of Service: 04/14/2019  Referring Physician: Marilynn Latino, MD   Date of Injury: 02/07/2019  Date Care Plan Established/Reviewed: 03/23/2019  Date Treatment Started: 03/23/2019  End of Certification Date: 06/20/2019  Sessions in Plan of Care: 16  Surgery Date: 02/16/2019    Visit Count: 6   Diagnosis:   1. Complex tear of medial meniscus of left knee as current injury, subsequent encounter    2. Other specified aftercare following surgery        Subjective     Social Support/Occupation  Lives in: multiple level home  Lives with: adult children  Occupation: Retired    Patient notes stiffness to the front of her L knee which is affecting her L hip and back. However, patient notes that overall it is getting better as she is getting less swelling to her L knee.       Precautions: No data was found  Allergies: Patient has no known allergies.                Treatment     Therapeutic Exercises   Justification: To increase strength and ROM.   Supine Active hamstring stretch 20 x 5 secs on L LE with verbal cuing on correct hand placement     SLR Abduction B sides with 1# 3 x 10 with verbal cuing to avoid going into increased thoracolumbar rotation     Foto Reviewed with patient    HEP reviewed to ensure patient compliance    Neuromuscular Re-Education   Justification: To improve B hip/core muscle activation  Single Leg stance on B LEs 2 x for 1 min each with verbal cuing to avoid going into B compensated Trendelenburg position     Single L Leg Press 80lb 3 x 10 with verbal cuing to engage L glute muscles and L VMO activation    Manual Therapy   Justification: To improve joint mobility and reduce soft tissue restrictions.  Strumming to L anterior incision point, L popliteus, and L medial hamstrings with patient in supine     Grade III/IV posterior L tibiofemoral joint mobs in supine    Therapeutic Activity   Justification: Reviewed sit<>stand mechanics and weight shifting  Patient  went from sit<>stand with 6 lbs with sitting with verbal cuing to hip hinge and weightbear mostly through her B LEs when going from sit<>stand.     Squat mechanics were reviewed with patient to effectively pick up a heavy item from the floor       ---      ---   Total Time   Timed Minutes  38 minutes   Total Time  38 minutes        Assessment   Patient is able to tolerate increased load to L LE since first starting PT. She is still noted to have decreased L LE weightbearing when performing sit<>stands. Patient is also noted to have decreased scar tissue fibrosity to her L incision point since starting PT. Foto was reviewed with patient at the end of the treatment session.   Plan   Treatment sessions will continue to focus on progressing leg press and weightbearing exercises. This will be done by adding more resistance.       Goals    Goal 1: Patient will demonstrate independence in prescribed HEP with proper form, sets and reps for safe discharge to an independent program.     04/05/2019 MJM    Access  Code: [email or web address]   Prepared by: [name]      Active Hamstring Stretch   SLS   SLR Abduction   Leg Press   Sit<>stands     Sessions: 16      Goal 2: Increase knee flexion AROM to 140 degrees to allow patient to safely negotiate stairs reciprocally with railing.   Sessions: 10      Goal 3: Pt will demonstrate increase in FOTO score to predicted score of 64 to return to PLOF     FOTO score at IE = 40 to be able to sleep 7-8 hours.      Sessions: 14                                   Francis Dowse, DPT

## 2019-04-19 ENCOUNTER — Inpatient Hospital Stay: Payer: Medicare Other | Attending: Orthopaedic Surgery | Admitting: Rehabilitative and Restorative Service Providers"

## 2019-04-19 DIAGNOSIS — X58XXXD Exposure to other specified factors, subsequent encounter: Secondary | ICD-10-CM | POA: Insufficient documentation

## 2019-04-19 DIAGNOSIS — S83232D Complex tear of medial meniscus, current injury, left knee, subsequent encounter: Secondary | ICD-10-CM | POA: Insufficient documentation

## 2019-04-19 DIAGNOSIS — Z4889 Encounter for other specified surgical aftercare: Secondary | ICD-10-CM

## 2019-04-19 NOTE — PT/OT Therapy Note (Signed)
Name: Michele [name] Age: 78 y.o.   Date of Service: 04/19/2019  Referring Physician: Marilynn Latino, MD   Date of Injury: 02/07/2019  Date Care Plan Established/Reviewed: 03/23/2019  Date Treatment Started: 03/23/2019  End of Certification Date: 06/20/2019  Sessions in Plan of Care: 16  Surgery Date: 02/16/2019    Visit Count: 7   Diagnosis:   1. Complex tear of medial meniscus of left knee as current injury, subsequent encounter    2. Other specified aftercare following surgery        Subjective     Social Support/Occupation  Lives in: multiple level home  Lives with: adult children  Occupation: Retired    Patient reports that her L knee feels better since first starting PT. She is able to ambulate for several minutes, sit, and and sleep with less pain and discomfort. However, she still notes tightness when bending it.       Precautions: No data was found  Allergies: Patient has no known allergies.    Objective     Range of Motion     04/19/2019   Left AROM    Left PROM   Knee 04/19/2019   Right AROM    Right PROM   140 pain   Flexion       -1    Extension       (blank fields were intentionally left blank)  04/19/2019 L Hip ER: 45 pain L hip IR: 25 . R HIP ER: 55 R HIP IR: 30    Strength     04/19/2019   Left Strength  Hip  MMT 04/19/2019   Right   5  Hip Flexion 5      Hip Extension       Hip Abduction       Hip Adduction       Hip IR       Hip ER       Quadriceps       Hamstrings     (blank fields were intentionally left blank)    04/19/2019   Left Strength  Knee  MMT 04/19/2019   Right     Knee Flexion     5-  Knee Extension     (blank fields were intentionally left blank)  04/19/2019 B Hip ER tested at 4-/5             Treatment     Therapeutic Exercises   Justification: To increase strength and ROM.   Supine Active hamstring stretch 20 x 5 secs on L LE with verbal cuing on correct hand placement     SLR Abduction B sides with 1.5# 3 x 10 with verbal cuing to avoid going into increased thoracolumbar rotation      HEP reviewed to ensure patient compliance    Neuromuscular Re-Education   Justification: To improve B hip/core muscle activation  Single Leg stance on B LEs 2 x for 1 min each with verbal cuing to avoid going into B compensated Trendelenburg position     Single L Leg Press 70lb 1 x 15 with verbal cuing to engage L glute muscles and L VMO activation    PNF Contract Relax of L hamstrings 20 x 5 secs with tactile cuing to initiate terminal knee xtension    Manual Therapy   Justification: To improve joint mobility and reduce soft tissue restrictions.  Strumming to L anterior incision point, L popliteus, and L medial hamstrings with patient in supine  Grade III/IV B lateral hip mobilizations with belt    Therapeutic Activity   Justification: To educate patient on hip hinging and weight acceptance to B LEs  Patient went from sit<>stand with 7 lbs with sitting with verbal cuing to hip hinge and weightbear mostly through her B LEs when going from sit<>stand.     Squat mechanics with 11.5lbs were reviewed with patient to effectively pick up a heavy item from the floor. Patient was cued how to hinge through her hips and to keep her arms close to the boyd (t-rex arms).        ---      ---   Total Time   Timed Minutes  38 minutes   Total Time  38 minutes        Assessment   Patient requires intermittent verbal cuing to maintain neutral spine by sitting on her pelvic floor. She also required verbal cuing to avoid increased use of lumbar extensors when performing sit<>stand exercises. Patient was able to have her B SLR abduction and leg press exercises progressed by adding more resistance. Patient is still noted to have L quad fatigue when performing standing balance exercise.   Plan   Treatment sessions will focus on improving weightbearing on patient's L LE. Patient will be instructed how to find her core in standing in the next treatment session.       Goals    Goal 1: Patient will demonstrate independence in  prescribed HEP with proper form, sets and reps for safe discharge to an independent program.     04/05/2019 MJM    Access Code: [email or web address]   Prepared by: [name]      Active Hamstring Stretch   SLS   SLR Abduction   Leg Press   Sit<>stands     Sessions: 16      Goal 2: Increase knee flexion AROM to 140 degrees to allow patient to safely negotiate stairs reciprocally with railing.   Sessions: 10      Goal 3: Pt will demonstrate increase in FOTO score to predicted score of 64 to return to PLOF     FOTO score at IE = 40 to be able to sleep 7-8 hours. Patient increased her score by 17 points.     04/19/2019 MJM     Sessions: 14                                   Francis Dowse, DPT

## 2019-04-21 ENCOUNTER — Other Ambulatory Visit (INDEPENDENT_AMBULATORY_CARE_PROVIDER_SITE_OTHER): Payer: Self-pay | Admitting: Internal Medicine

## 2019-04-21 ENCOUNTER — Inpatient Hospital Stay: Payer: Medicare Other | Attending: Orthopaedic Surgery | Admitting: Rehabilitative and Restorative Service Providers"

## 2019-04-21 DIAGNOSIS — S83232D Complex tear of medial meniscus, current injury, left knee, subsequent encounter: Secondary | ICD-10-CM | POA: Insufficient documentation

## 2019-04-21 DIAGNOSIS — E785 Hyperlipidemia, unspecified: Secondary | ICD-10-CM

## 2019-04-21 DIAGNOSIS — Z4889 Encounter for other specified surgical aftercare: Secondary | ICD-10-CM | POA: Insufficient documentation

## 2019-04-21 NOTE — PT/OT Therapy Note (Signed)
Name: Michele [name] Age: 78 y.o.   Date of Service: 04/21/2019  Referring Physician: Marilynn Latino, MD   Date of Injury: 02/07/2019  Date Care Plan Established/Reviewed: 03/23/2019  Date Treatment Started: 03/23/2019  End of Certification Date: 06/20/2019  Sessions in Plan of Care: 16  Surgery Date: 02/16/2019    Visit Count: 8   Diagnosis:   1. Complex tear of medial meniscus of left knee as current injury, subsequent encounter    2. Other specified aftercare following surgery        Subjective     Social Support/Occupation  Lives in: multiple level home  Lives with: adult children  Occupation: Retired    Patient reports that the inferior part of her L incision point is still very sensitive. She aslo notes that it feels different in terms of sensation than her R side.       Precautions: No data was found  Allergies: Patient has no known allergies.    Objective     Range of Motion     04/21/2019   Left AROM    Left PROM   Knee 04/21/2019   Right AROM    Right PROM   140 pain   Flexion       0    Extension       (blank fields were intentionally left blank)  04/21/2019 L Hip ER: 47 L Hip IR: 26             Treatment     Therapeutic Exercises   Justification: To increase strength and ROM.   Supine Active hamstring stretch 20 x 5 secs on L LE with verbal cuing on correct hand placement     L modified thomas stretch 10 x 5 sec holds with verbal cuing to avoid going into increased lumbar lordosis by bending alternative leg    HEP reviewed to ensure patient compliance    Neuromuscular Re-Education   Justification: To increase strength and ROM.   Single Leg stance on B LEs 2 x for 1 min each with verbal cuing to avoid going into B compensated Trendelenburg position     Single L Leg Press 80lb 1 x 15 with verbal cuing to engage L glute muscles and L VMO activation    PNF Contract Relax of L hamstrings 20 x 5 secs with tactile cuing to initiate terminal knee extension    Manual Therapy   Justification: To improve joint  mobility and reduce soft tissue restrictions.  Sensitization with cotton ball to L anterior incision point in supine    Grade III/IV lateral hip mobilizations to L hip with belt in supine     Grade III/IV posterior L tibiofemoral joint mobilizations in supine    Therapeutic Activity   Justification: To educate patient on hip hinging and weight acceptance to B LEs  Patient went from sit<>stand with 8 lbs with sitting with verbal cuing to hip hinge and weightbear mostly through her B LEs when going from sit<>stand. Patient was verbally cued to avoid overusing her lumbar extensors.     Squat mechanics with 11.5lbs were reviewed with patient to effectively pick up a heavy item from the floor. Patient was cued how to hinge through her hips and to keep her arms close to the bdy (t-rex arms).          ---      ---   Total Time   Timed Minutes  38 minutes   Total Time  38  minutes        Assessment   Patient is noted to have increased R lateral weight shifting when performing sit<>stands. She required intermittent verbal cuing to distribute her weight more evenly. Patient had increased L knee flexion after performing sit<>stands and with the L posterior tibiofemoral joint mobilizations. The modified thomas stretch was adde to patient's exercise sheet.   Plan   Treatment sessions will focus on reducing L knee incision point sensitivity and increased L LE strength. Additional weight will be added to the 11.5# box in the next treatment session.       Goals    Goal 1: Patient will demonstrate independence in prescribed HEP with proper form, sets and reps for safe discharge to an independent program.     04/21/2019 MJM    Access Code: [email or web address]   Prepared by: [name]      Active Hamstring Stretch 3 x 10 5 sec holds  SLS 2x 1 min each side  SLR Abduction 3 x 10  Leg Press 3 x 10   Sit<>stands 2 x 10  Modified thomas stretch 10 x 5 sec holds   Sessions: 16      Goal 2: Increase knee flexion AROM to 140 degrees to allow  patient to safely negotiate stairs reciprocally with railing. Patient is able to ahieve 140 degrees of L knee flexion.     04/21/2019 MJM   Sessions: 10      Goal 3: Pt will demonstrate increase in FOTO score to predicted score of 64 to return to PLOF     FOTO score at IE = 40 to be able to sleep 7-8 hours. Patient increased her score by 17 points.     04/19/2019 MJM     Sessions: 14                                   Francis Dowse, DPT

## 2019-04-26 ENCOUNTER — Inpatient Hospital Stay: Payer: Medicare Other | Attending: Orthopaedic Surgery | Admitting: Rehabilitative and Restorative Service Providers"

## 2019-04-26 DIAGNOSIS — Z4889 Encounter for other specified surgical aftercare: Secondary | ICD-10-CM | POA: Insufficient documentation

## 2019-04-26 DIAGNOSIS — S83232D Complex tear of medial meniscus, current injury, left knee, subsequent encounter: Secondary | ICD-10-CM | POA: Insufficient documentation

## 2019-04-26 NOTE — PT/OT Therapy Note (Signed)
Name: Michele [name] Age: 78 y.o.   Date of Service: 04/26/2019  Referring Physician: Marilynn Latino, MD   Date of Injury: 02/07/2019  Date Care Plan Established/Reviewed: 03/23/2019  Date Treatment Started: 03/23/2019  End of Certification Date: 06/20/2019  Sessions in Plan of Care: 16  Surgery Date: 02/16/2019    Visit Count: 9   Diagnosis:   1. Complex tear of medial meniscus of left knee as current injury, subsequent encounter    2. Other specified aftercare following surgery        Subjective     Social Support/Occupation  Lives in: multiple level home  Lives with: adult children  Occupation: Retired    She has some easy, some bad days. Sometimes she gets stabbing pains. The L leg feels heavy, when walking with stairs, but anytime.     Pt reports pain right now in the L leg at 6/10 at rest in sitting position. Reports       Precautions: No data was found  Allergies: Patient has no known allergies.    Objective   L quad: 4-/5 (Pain L quad, popliteal area), L hamstrings: 4-/5     6/10 > 0/10 pain             Treatment     Neuromuscular Re-Education   Justification: To increase strength and ROM.   L Pelvic posterior depression with prolonged end range holds and COI, followed by  Pelvic PD with LE ext/ abd/IR with end range holds and COI to improve weight acceptance into LE > with axial load and resisted hip extension (pt required cues for gluteal activation)    Weight shift L with cues for WB over arch, quad/glute activation, pelvic PD > SLS on L 3x~15" each     Stepping with L foot, cues for gluteal activation during    Manual Therapy   Justification: To improve joint mobility and reduce soft tissue restrictions.  R S/L::     STM L QL, TFL, iliacus, BCC iliac crest    STM L TFL     STM L ITB and quads with "the stick"     Gapping L/S L3-5 with CR using L hip hike    Therapeutic Activity            ---      ---   Total Time   Timed Minutes  40 minutes   Total Time  40 minutes        Assessment   Pre-tx, pt reports  pain in L knee when sitting and during stance phase of gait. After MT with focus on restoring L pelvic PD PROM, which is significantly limited, pt reports no pain in sitting and no pain with walking post-tx. PT notes no gluteal activation with pelvic PD full LE pattern until PT provided cues. PT suspects poor gluteal activation during stance phase of gait contributes to L knee instability and pain. Continue POC and continue to restore pelvic PD. Pt tolerated all interventions well with no lingering symptoms afterward.    Plan   Pelvic PD, gluteal activation       Goals    Goal 1: Patient will demonstrate independence in prescribed HEP with proper form, sets and reps for safe discharge to an independent program.     04/21/2019 MJM    Access Code: [email or web address]   Prepared by: [name]      Active Hamstring Stretch 3 x 10 5 sec holds  SLS 2x 1 min  each side  SLR Abduction 3 x 10  Leg Press 3 x 10   Sit<>stands 2 x 10  Modified thomas stretch 10 x 5 sec holds   Sessions: 16      Goal 2: Increase knee flexion AROM to 140 degrees to allow patient to safely negotiate stairs reciprocally with railing. Patient is able to ahieve 140 degrees of L knee flexion.     04/21/2019 MJM   Sessions: 10      Goal 3: Pt will demonstrate increase in FOTO score to predicted score of 64 to return to PLOF     FOTO score at IE = 40 to be able to sleep 7-8 hours. Patient increased her score by 17 points.     04/19/2019 MJM     Sessions: 14                                   Candis Schatz, DPT

## 2019-04-28 ENCOUNTER — Inpatient Hospital Stay: Payer: Medicare Other | Attending: Orthopaedic Surgery | Admitting: Rehabilitative and Restorative Service Providers"

## 2019-04-28 DIAGNOSIS — Z4889 Encounter for other specified surgical aftercare: Secondary | ICD-10-CM

## 2019-04-28 DIAGNOSIS — S83232D Complex tear of medial meniscus, current injury, left knee, subsequent encounter: Secondary | ICD-10-CM | POA: Insufficient documentation

## 2019-04-28 NOTE — Progress Notes (Signed)
Name:Michele [name] Age: 78 y.o.   Date of Service: 04/28/2019  Referring Physician: Marilynn Latino, MD   Date of Injury: 02/07/2019  Date Care Plan Established/Reviewed: 04/28/2019  Date Treatment Started: 03/23/2019  End of Certification Date: 06/20/2019  Sessions in Plan of Care: 2  Surgery Date: 02/16/2019      Visit Count: 10   Diagnosis:   1. Complex tear of medial meniscus of left knee as current injury, subsequent encounter    2. Other specified aftercare following surgery        Subjective     History of Present Illness   Functional Limitations (PLOF): Patient is unable to negotiate one flight of stairs reciprocally. (Pt was able to negotiate multiple flights of stairs). - Progressing: uses nonreciprocal pattern, holding onto handrail pulling herself up  Patient is unable to sleep for more than 3 hours. (Pt was able to sleep for 7-8 hours a night). - Pt sleeping hrough the night  Patient is unable to ambulate 10 minutes when running errands. (Pt was able to ambulate 1 hour when running errands).  Pt can now walk 15-20' with stops due to tiredness of L LE, as well as back pain and knee pain    Social Support/Occupation  Lives in: multiple level home  Lives with: adult children  Occupation: Retired    L leg is weak. She has sharp pains in inside of L knee. It is slightly better but still weak. She saw MD last Tuesday.    When asked about shaking, reports he had some shaking prior to surgery, but now it is worse. She saw neurologist who diagnosed her with c/s stenosis and that's why she's shaking. After that she saw her MD who sent her to a neurosurgeon who said it doesn't come from spinal stenosis and that she should return to the neurologist. She saw Dr. Selena Batten in this building. Dr. Janee Morn gave her the name of a good neurologist.       Precautions: No data was found  Allergies: Patient has no known allergies.    Past Medical History:   Diagnosis Date    Abnormal EKG     Arthralgia     Barrett's esophagus      Cervical spinal stenosis     Colonic polyp     Depression     Disc disorder     Gastroesophageal reflux disease     Lower back pain     Numbness and tingling of both legs 02/06/2015    Osteoarthritis     Osteopenia        Objective     Range of Motion     04/28/19   Left AROM 04/28/19   Left PROM   Knee 04/28/19   Right AROM 04/28/19   Right PROM   135 pain 137  Flexion 139      -8    Extension 0      (blank fields were intentionally left blank)  "Big cramp" in hamstrings with L knee flexion AROM   Painful in kneecap with L knee extension AROM     Strength     04/28/19   Left Strength  Hip  MMT 04/28/19   Right     Hip Flexion     4-  Hip Extension 4-    4  Hip Abduction     4  Hip Adduction       Hip IR       Hip ER  4  Quadriceps 4    4  Hamstrings 4-    (blank fields were intentionally left blank)    04/28/19   Left Strength  Knee  MMT 04/28/19   Right   5  Knee Flexion 5    4  Knee Extension 5-    (blank fields were intentionally left blank)  Sensation: Numb lower half of patella and anterior aspect of knee  Glute squeeze: Hamstring dominant L>R    Microfet testing for B quads: 21.0 L, 20.2 R in long arc quad     Palpation 03/23/2019 Mod TTP L popliteus and medial hamstrings    Patellar Mobility   Left Knee Hypomobile in the left medial and left inferior patellar tendon(s).     Right Knee Patellar tendons within functional limits include the medial and lateral.              Treatment     Therapeutic Exercises   Justification: Update case, manage case  Discussed normal progression of nerve healing with numbness occurring first and then hypersensitivity, taking ~1 year to fully heal after surgery     Advised pt to return to neurologist regarding testing for shaking; advised her that spinal issues normally do not cause shaking at rest and that something else appears to be going on which would be best diagnosed by a neurologist    Discussed doing 2 more visits for knees, then initiating PT for low back      Objective measurements and subjective history gathered as part of TE      Neuromuscular Re-Education   Justification: Improve gluteal activation, PFJ tracking during movement  Improve squat form  Prone gluteal activation, cues for relaxation of hamstrings- tactile and verbal     Taping L PFJ into medial glide using leukotape and cover roll to facilitate proper patellar tracking     Pt advised to remove in 4 days or sooner if redness/itching/irritation occur    Squat at table, cues for gluteal activation, equal weight distribution between R and L foot: 2x10 reps       ---      ---   Total Time   Timed Minutes  40 minutes   Total Time  40 minutes        Assessment     Patient has been seen for 10 visits in this case from 03/23/2019 until 04/28/2019. The primary encounter diagnosis was Complex tear of medial meniscus of left knee as current injury, subsequent encounter. A diagnosis of Other specified aftercare following surgery was also pertinent to this visit.  Patient has made good progress in pain, range of motion and strength since the initial evaluation. Patient would benefit from continuing skilled PT intervention for 2 more visits, then transitioning to PT for the low back, to address these deficits and achieve the below listed goals.      Michele [name] is a 78 y.o. female presenting with L knee menisectomy who requires Physical Therapy for the following:  Impairments:   IPTC Impairments: Pain that limits and interferes with functional ability  Decreased range of motion  Decreased strength  Swelling/Edema/Effusion  Decreased functional stability  Decreased joint mobility  Decreased soft tissue mobility  Decreased/impaired motor control  Decreased static balance  Decreased dynamic balance    Pain located: R medial Knee and along incision points    Clinical presentation: stable - normal post-operative recovery (no infections/incisions healing/average swelling)  Barriers to therapy: No barriers to rehab.   Functional  Limitations (PLOF): Patient is unable to negotiate one flight of stairs reciprocally. (Pt was able to negotiate multiple flights of stairs). - Progressing: uses nonreciprocal pattern, holding onto handrail pulling herself up  Patient is unable to sleep for more than 3 hours. (Pt was able to sleep for 7-8 hours a night). - Pt sleeping hrough the night  Patient is unable to ambulate 10 minutes when running errands. (Pt was able to ambulate 1 hour when running errands).  Pt can now walk 15-20' with stops due to tiredness of L LE, as well as back pain and knee pain  Prognosis: excellent  Plan   Visits per week: 2  Number of Sessions: 2  Direct One on One  16109: Therapeutic Exercise: To Develop Strength and Endurance, ROM and Flexibility  [id number]: Gait Training  60454: Neuromuscular Reeducation (Proprioceptive Neuromuscular Faciliation)  97140: Manual Therapy techniques (mobilization, manipulation, manual traction) (Grade I-V to patellofemoral joint, tibiofemoral joint, ankle, hip, and regionally interdependent joints, soft tissue mobilization, instrument assisted soft tissue mobilization.)  97530: Therapeutic Activities: Dynamic activities to improve functional performance  Dry Needling  Supervised Modalities  97010: Thermal modalities: hot/cold packs  09811: Mechnical traction  97014: Electrical stimulation    Goals    Goal 1: Patient will demonstrate independence in prescribed HEP with proper form, sets and reps for safe discharge to an independent program.     04/21/2019 MJM    Access Code: [email or web address]   Prepared by: [name]      Active Hamstring Stretch 3 x 10 5 sec holds  SLS 2x 1 min each side  SLR Abduction 3 x 10  Leg Press 3 x 10   Squat at table 2 x 10  Modified thomas stretch 10 x 5 sec holds   Sessions: 16      Goal 2: Increase knee flexion AROM to 140 degrees to allow patient to safely negotiate stairs reciprocally with railing. Patient is able to ahieve 140 degrees of L knee flexion.     Progressing-  she's holding onto rail and pulling herself up due to L L knee pain; she uses nonreciprocal pattern for ascension and descension  04/21/2019 MJM   Sessions: 10      Goal 3: Pt will demonstrate increase in FOTO score to predicted score of 64 to return to PLOF     FOTO score at IE = 40 to be able to sleep 7-8 hours. Patient increased her score by 17 points.     04/19/2019 MJM     Sessions: 14                                   Candis Schatz, DPT

## 2019-05-03 ENCOUNTER — Inpatient Hospital Stay: Payer: Medicare Other | Attending: Orthopaedic Surgery | Admitting: Rehabilitative and Restorative Service Providers"

## 2019-05-03 DIAGNOSIS — S83232D Complex tear of medial meniscus, current injury, left knee, subsequent encounter: Secondary | ICD-10-CM | POA: Insufficient documentation

## 2019-05-03 DIAGNOSIS — Z4889 Encounter for other specified surgical aftercare: Secondary | ICD-10-CM | POA: Insufficient documentation

## 2019-05-03 NOTE — PT/OT Therapy Note (Signed)
Name: Michele [name] Age: 78 y.o.   Date of Service: 05/03/2019  Referring Physician: Marilynn Latino, MD   Date of Injury: 02/07/2019  Date Care Plan Established/Reviewed: 04/28/2019  Date Treatment Started: 03/23/2019  End of Certification Date: 06/20/2019  Sessions in Plan of Care: 2  Surgery Date: 02/16/2019    Visit Count: 11   Diagnosis:   1. Complex tear of medial meniscus of left knee as current injury, subsequent encounter    2. Other specified aftercare following surgery        Subjective     Social Support/Occupation  Lives in: multiple level home  Lives with: adult children  Occupation: Retired    "The tape was horrible." she had a hard time taking it off.   The knee (underneath) is giving her sharp pains.   She has appt next month with her MD.      Precautions: No data was found  Allergies: Patient has no known allergies.    Objective   Lacks TKE during heel strike (Pain when attemping to perform heel strike with TKE with quad activation) > post-tx, no pain with heel strike while walking with TKE             Treatment     Neuromuscular Re-Education   Justification:  To increase core/glute/quad activation  R S/L: L Pelvic posterior depression with prolonged end range holds and COI, followed by  Pelvic PD with LE ext/ abd/IR with end range holds and COI to improve weight acceptance into LE > with axial load and resisted hip extension (pt required cues for gluteal activation)    Holding onto table with B UE:  TKE against red sports cord: cues for glute/quad activation, eccentric release: 5" x 15 reps        Manual Therapy   Justification: Improve soft tissue and joint mobility     STM surgical scar  Fascial release skin overlying mid-to inferior aspect of patella  Gr III medial glide and lateral tilt to stretch lateral retinaculum   All with ankle pump FMP     R S/L: Gapping L4-5, L5-S1, L SIJ with CR using hip hike    Therapeutic Activity   Justification: Improve stepping and weight acceptance  Stepping,  cues for TKE at heel strike and quad activation - static > then dynamically around clinic    Holding onto table with B UE:  Weight acceptance forward, cues for gluteal activation, heel strike, quad activation, alignment       ---      ---   Total Time   Timed Minutes  41 minutes   Total Time  41 minutes        Assessment   Pre-tx, pt reports pain in L knee, but pt notes limited scar tissue, fascial, and PFJ mobility. Once performing pelvic PD with full LE pattern, pt's ability to perform heel strike with TKE significantly improves during gait. Pt tolerated all interventions well with no lingering symptoms afterward.    Plan   Pelvic PD, gluteal activation   One more visit for knee, then d/c and initiate PT for low back      Goals    Goal 1: Patient will demonstrate independence in prescribed HEP with proper form, sets and reps for safe discharge to an independent program.     04/21/2019 MJM    Access Code: [email or web address]   Prepared by: [name]      SLS 2x 1 min each side  SLR Abduction 3 x 10  Leg Press 3 x 10   Squat at table 2 x 10  Modified thomas stretch 10 x 5 sec holds  Standing TKE: 2x10 5" holds, 1x/day  Forward weight shift in stride stance: 2x10, 1x/day    Sessions: 16      Goal 2: Increase knee flexion AROM to 140 degrees to allow patient to safely negotiate stairs reciprocally with railing. Patient is able to ahieve 140 degrees of L knee flexion.     Progressing- she's holding onto rail and pulling herself up due to L L knee pain; she uses nonreciprocal pattern for ascension and descension  04/21/2019 MJM   Sessions: 10      Goal 3: Pt will demonstrate increase in FOTO score to predicted score of 64 to return to PLOF     FOTO score at IE = 40 to be able to sleep 7-8 hours. Patient increased her score by 17 points.     04/19/2019 MJM     Sessions: 14                                   Candis Schatz, DPT

## 2019-05-05 ENCOUNTER — Inpatient Hospital Stay: Payer: Medicare Other | Attending: Orthopaedic Surgery | Admitting: Rehabilitative and Restorative Service Providers"

## 2019-05-05 DIAGNOSIS — Z4889 Encounter for other specified surgical aftercare: Secondary | ICD-10-CM | POA: Insufficient documentation

## 2019-05-05 DIAGNOSIS — S83232D Complex tear of medial meniscus, current injury, left knee, subsequent encounter: Secondary | ICD-10-CM | POA: Insufficient documentation

## 2019-05-05 NOTE — PT/OT Therapy Note (Addendum)
Name: Baya Pressler Age: 78 y.o.   Date of Service: 05/05/2019  Referring Physician: Marilynn Latino, MD   Date of Injury: 02/07/2019  Date Care Plan Established/Reviewed: 04/28/2019  Date Treatment Started: 03/23/2019  End of Certification Date: 06/20/2019  Sessions in Plan of Care: 2  Surgery Date: 02/16/2019    Visit Count: 12   Diagnosis:   1. Complex tear of medial meniscus of left knee as current injury, subsequent encounter    2. Other specified aftercare following surgery      Discontinuation of Therapy Services    Devani Neilan did not complete prescribed physical therapy visits.      Status is unknown at this time, physical therapy has been discontinued and patient has been discharged from care.  The last therapy note is below for review.    Please feel free to contact me with any questions regarding the care of [facility].    Sincerely,    [name] PT, DPT        06/21/2019          Subjective     Social Support/Occupation  Lives in: multiple level home  Lives with: adult children  Occupation: Retired    Patient reports that her L knee is feeling better since starting PT. However, she notes that her L leg can feel fatigued and shakes when performing any standing activity for a prolonged period of time.       Precautions: No data was found  Allergies: Patient has no known allergies.    Objective     Range of Motion     05/05/2019   Left AROM    Left PROM   Knee 05/05/2019   Right AROM    Right PROM   145 pain   Flexion       0    Extension       (blank fields were intentionally left blank)  05/05/2019 Patient starts to experience pain at 140 degrees of L knee flexion. L hip ER PROM: 50 degrees. Post Tx: L knee flexion AROM: 145 degrees of pain free motion    Strength     05/05/2019   Left Strength  Knee  MMT 05/05/2019   Right     Knee Flexion     5-  Knee Extension     (blank fields were intentionally left blank)             Treatment     Therapeutic Exercises   Justification: To increase strength  and ROM.   Supine Active hamstring stretch 20 x 5 secs on L LE with verbal cuing on correct hand placement     L modified thomas stretch 10 x 5 sec holds with verbal cuing to avoid going into increased lumbar lordosis by bending alternative leg    L knee ROM and strength assessed before treatment session     Foto completed by patient    Neuromuscular Re-Education   Justification: To improve B hip/core muscle activation  Single Leg stance on B LEs 2 x for 1 min each with verbal cuing to avoid going into B compensated Trendelenburg position     Single L Leg Press 80lb 1 x 15 with verbal cuing to engage L glute muscles and L VMO activation    PNF Contract Relax of L hamstrings 20 x 5 secs with tactile cuing to initiate terminal knee extension      Manual Therapy   Justification: To improve joint mobility  and reduce soft tissue restrictions.  Grade III/IV lateral hip mobilizations to L hip with belt in supine     Grade III/IV posterior L tibiofemoral joint mobilizations with traction in supine    Therapeutic Activity   Justification: To practice lifting mechanics.  Patient instructed to maintain wide base of support and to bring 11.5 lb box closer to her body when lifting. Patient also educated how to maintain a neutral lumbar spine by incorporating diaphragmatic breathing to allow her lumbar extensors and abdominal muscles to relax.     Patient instructed to hinge through her hips and to shift her weight further posteriorly when performing exercise.       ---      ---   Total Time   Timed Minutes  38 minutes   Total Time  38 minutes        Assessment   Patient is noted to have tenderness to the front of her L knee when palpated. However, this tenderness was reduced after performing strengthening exercises like the leg press and box lifts. Patient is noted to have improved L knee ROM and her L knee strength has improved. Patient was instructed to avoid being in one position for too long when she is at home. Foto  was completed by the patient at the end of the session.   Plan   Treatment sessions will continue to focus on improving L LE strength and endurance. Her LP will have additional weight placed on it and her lifting will be progressed by adding increased resistance.       Goals    Goal 1: Patient will demonstrate independence in prescribed HEP with proper form, sets and reps for safe discharge to an independent program.     05/05/2019 MJM    Access Code: [email or web address]   Prepared by: [name]      SLS 2x 1 min each side  SLR Abduction 3 x 10  Leg Press 3 x 10   Squat at table 2 x 10  Modified thomas stretch 10 x 5 sec holds  Standing TKE: 2x10 5" holds, 1x/day  Forward weight shift in stride stance: 2x10, 1x/day   Lifting Mechanics with box   Sessions: 16      Goal 2: Increase knee flexion AROM to 140 degrees to allow patient to safely negotiate stairs reciprocally with railing. Patient is able to ahieve 140 degrees of L knee flexion.     Progressing- she's holding onto rail and pulling herself up due to L L knee pain; she uses nonreciprocal pattern for ascension and descension  04/21/2019 MJM   Sessions: 10      Goal 3: Pt will demonstrate increase in FOTO score to predicted score of 64 to return to PLOF     FOTO score at IE = 40 to be able to sleep 7-8 hours. Patient increased her score by 17 points.     04/19/2019 MJM     Sessions: 14                                   Francis Dowse, DPT

## 2019-05-09 ENCOUNTER — Inpatient Hospital Stay: Payer: Medicare Other | Attending: Family | Admitting: Rehabilitative and Restorative Service Providers"

## 2019-05-09 VITALS — BP 119/76 | HR 72

## 2019-05-09 DIAGNOSIS — M545 Low back pain, unspecified: Secondary | ICD-10-CM

## 2019-05-09 NOTE — Progress Notes (Signed)
Name:Keanna Behrend Age: 78 y.o.   Date of Service: 05/09/2019  Referring Physician: Eliezer Lofts, NP   Date of Injury: 02/07/2019  Date Care Plan Established/Reviewed: 04/28/2019  Date Treatment Started: 03/23/2019  End of Certification Date: 06/20/2019  Sessions in Plan of Care: 2  Surgery Date: 02/16/2019      Visit Count: 1   Diagnosis:   No diagnosis found.         Precautions: No data was found  Allergies: Patient has no known allergies.    Past Medical History:   Diagnosis Date    Abnormal EKG     Arthralgia     Barrett's esophagus     Cervical spinal stenosis     Colonic polyp     Depression     Disc disorder     Gastroesophageal reflux disease     Lower back pain     Numbness and tingling of both legs 02/06/2015    Osteoarthritis     Osteopenia                                   Candis Schatz, DPT

## 2019-05-09 NOTE — PT/OT Therapy Note (Signed)
Name: Michele [name] Age: 78 y.o.   Date of Service: 05/09/2019  Referring Physician: Eliezer Lofts, NP   Date of Injury: 04/04/2019  Date Care Plan Established/Reviewed: 05/09/2019  Date Treatment Started: 05/09/2019  End of Certification Date: 07/07/2019  Sessions in Plan of Care: 16  Surgery Date: 02/16/2019    Visit Count: 1   Diagnosis:   1. Bilateral low back pain, unspecified chronicity, unspecified whether sciatica present        Subjective     History of Present Illness   Mechanism of injury: Unknown   History of Present Illness: LBP started years ago. It was really bad a few years ago so she went to a chiropractor ~20 years ago who found 2 discs were out of place. "He fixed me." She didn't finish the therapy with the chiropractor but it never stopped bothering her.   She feels pressure from shoulders to lower lumbar spine. Then entire L leg hurt her the other day when standing and doing tasks in the kitchen (the anterior aspect of lower leg hurts:: pt points to anterior tibialis). L knee is numb. Under L kneecap gives her bad, sharp pains, as does anterior aspect of L lower leg. L lateral malloelus was purple last visit but now it's ok.     She has the shakiness now, but doesn't know what that's coming from. She went to neurologist who found cervical stenosis, but when going to neurosurgeon they said shaking doesn't come from that. In the meantime the L knee problem started so she never went back to a different doctor for this. She wants to finish PT for the back, then see if the shaking stops.   Shaking happens at rest, especially when standing and shaking happens when standing on L leg.   Low back pain is worse since L knee surgery.     Reports that after PT visit, the L hip isn't bothering her as much. The R hip hurts as well.     Pt has 15-16 steps at home with railing on 1 side. Reports no changes on medical history form: nothing has changed.   Functional Limitations (PLOF): Pt is limited with sitting  20' prior to onset of low back pain and when sitting back on tailbone the low back "grabs" her (Pt could sit at least 1 hour prior)  Pt is limited with standing immediately due to feeling tired (Pt could stand at least 1 hour prior)  Pt is limited with walking immediately due to gluteal pain and low back; she can walk 20' in pain and then has to stop. B legs are tired afterward (Pt could walk at least 1 mile prior)  Pt is limited with lifting 12-Lb box 2x due to feeling tired (Pt could previously lift at least 30 Lbs)  Pt is limited with bed mobility tasks (Pt performed with no difficulty prior to onset of pain)  Pt is limited with stair ascension and descension holding onto rail with 1 hand and using nonreciprocal pattern (pt could previously negotiate at least 3 FOS using reciprocal pattern without pain or difficulty)  Pt is limited with reaching for object from floor Pt tolerated all interventions well with no lingering symptoms afterward.        Pain   Location: Shoulder pain, from c/s to l/s. Also has R and L gluteal pain (but L hip is improved since last PT visit for knee)    Social Support/Occupation  Lives in: multiple level home  Lives with: adult children  Occupation: Retired      Precautions: No data was found  Allergies: Patient has no known allergies.    Objective   Stands with medial shear of talus    Squat: anterior tibial translation with quad dominance, B knee valgus (slight), overpronation B (left leg "pulling" on outside, front, and back reported)     Elevated R scapula, L iliac crest, prominent L PSIS, L-rotated sacrum    Range of Motion     AROM: Lumbar Spine   11.2.20    Lumbar Flexion Reaches dorsum of foot pain   Lumbar Extension ~5 deg pain   (blank fields were intentionally left blank)    11.2.20   Left AROM    Left PROM   Lumbar/Hip 11.2.20   Right AROM    Right PROM   ~20%    Lumbar Rotation ~40%          Hip Flexion           Hip Extension           Hip Abduction           Hip Adduction            Hip IR           Hip ER       (blank fields were intentionally left blank)  11.2.20: Forward flexion: rib hump L, significant posterior weight shift (pain B sides of l/s)        Strength     11.2.20   Left Strength  Hip  MMT 11.2.20   Right   3+  Hip Flexion 4-      Hip Extension       Hip Abduction       Hip Adduction       Hip IR       Hip ER       Quadriceps       Hamstrings     (blank fields were intentionally left blank)    11.2.20   Left Strength  Knee  MMT 11.2.20   Right   4- pain Knee Flexion 5    4- pain Knee Extension 5    (blank fields were intentionally left blank)    11.2.20   Left Strength  Ankle/Foot  MMT 11.2.20   Right   4  Ankle Dorsiflexion 5      Ankle Plantarflexion       Ankle Inversion       Ankle Eversion     (blank fields were intentionally left blank)  Pain R patella with resisted knee extension, R popliteal region with resisted knee flexion  Plantarflexion R: 5/5, L: 4/5 (Pain L popliteal region)      Neurological Testing     Sensation     Lumbar   Left   Diminished: light touch    Comments   Left light touch: Diminished L peroneals, anterior tibialis, medial joint line. Absent anterior aspect of patella and along patellar borders.       BP: 119/76 Heart Rate: 72    Treatment     Therapeutic Exercises   Justification: Improve peroneal nerve mobility, manage case  Seated L peroneal nerve glide x 20 reps, cues for proper ankle/foot alignment, to avoid pain/symptoms  Discussed reason behind activity    Advised pt on need to see neurologist regarding shaking and that shaking is not likely coming from spine    Discussed that we would focus  on low back only due to pt only having script for low back     Manual Therapy   Justification: Improve soft tissue and joint mobility     R S/L: Gapping L1-5, L SIJ with CR using hip hike    R S/L: L Pelvic posterior depression with prolonged end range holds and COI at end-range (as NMR post-MT)       ---      ---   Total Time   Timed Minutes  24  minutes   Untimed Minutes  25 minutes   Total Time  49 minutes        Assessment   Michele [name] is a 78 y.o. female presenting with LBP who requires Physical Therapy for the following:  Impairments:   IPTC Impairments: Pain that limits and interferes with functional ability  Impaired postural alignment  Decreased range of motion  Decreased strength  Decreased functional stability  Decreased joint mobility  Decreased soft tissue mobility  Decreased/impaired motor control  Neural tension  Decreased coordination    Pain located: lumbar spine, cervical spine, thoracic spine, B gluteal region, L thigh, L anterior knee, L lower leg (anterior aspect)     Clinical presentation: evolving - initial pain that is moving or expanding in location/intensity/type  Barriers to therapy: Time since onset of injury/illness/exacerbation - causing increased atrophy, resulting in multi-joint involvement and increased biomechanical compensation over time  Past surgical history - surgical intervention to regionally interdependent body region      Functional Limitations (PLOF): Pt is limited with sitting 20' prior to onset of low back pain and when sitting back on tailbone the low back "grabs" her (Pt could sit at least 1 hour prior)  Pt is limited with standing immediately due to feeling tired (Pt could stand at least 1 hour prior)  Pt is limited with walking immediately due to gluteal pain and low back; she can walk 20' in pain and then has to stop. B legs are tired afterward (Pt could walk at least 1 mile prior)  Pt is limited with lifting 12-Lb box 2x due to feeling tired (Pt could previously lift at least 30 Lbs)  Pt is limited with bed mobility tasks (Pt performed with no difficulty prior to onset of pain)  Pt is limited with stair ascension and descension holding onto rail with 1 hand and using nonreciprocal pattern (pt could previously negotiate at least 3 FOS using reciprocal pattern without pain or difficulty)  Pt is limited with reaching  for object from floor Pt tolerated all interventions well with no lingering symptoms afterward.      Prognosis: excellent  Plan   Visits per week: 2  Number of Sessions: 16  Direct One on One  16109: Therapeutic Exercise: To Develop Strength and Endurance, ROM and Flexibility  [id number]: Gait Training  60454: Neuromuscular Reeducation (Proprioceptive Neuromuscular Faciliation)  97140: Manual Therapy techniques (mobilization, manipulation, manual traction) (Grade I-V to lumbar spine, pelvic girdle, and regionally interdependent joints, soft tissue mobilization, instrument assisted soft tissue mobilization.)  97530: Therapeutic Activities: Dynamic activities to improve functional performance  Dry Needling  Supervised Modalities  97010: Thermal modalities: hot/cold packs  09811: Mechnical traction  97014: Electrical stimulation  Assess      Goals    Goal 1: Pt will independently perform their home exercise program to maintain gains between PT visits.      Sessions: 3      Goal 2: Pt will demonstrate L quad and hamstring strength  of at least 4/5 to improve pt's ability to lift 15# from floor to waist-height for at least 10 repetitions using proper form to demonstrate improved ability to lift groceries from floor to counter.   Sessions: 9      Goal 3: Patient will improve B hip flexion strength to at least 4/5 to improve pt's ability to negotiate 1 FOS using handrail, using reciprocal pattern.     Sessions: 16                                   Candis Schatz, DPT

## 2019-05-11 ENCOUNTER — Encounter (INDEPENDENT_AMBULATORY_CARE_PROVIDER_SITE_OTHER): Payer: Self-pay

## 2019-05-12 ENCOUNTER — Inpatient Hospital Stay: Payer: Medicare Other | Attending: Family | Admitting: Rehabilitative and Restorative Service Providers"

## 2019-05-12 DIAGNOSIS — M545 Low back pain, unspecified: Secondary | ICD-10-CM

## 2019-05-12 NOTE — PT/OT Therapy Note (Signed)
Name: Michele [name] Age: 78 y.o.   Date of Service: 05/12/2019  Referring Physician: Eliezer Lofts, NP   Date of Injury: 04/04/2019  Date Care Plan Established/Reviewed: 05/09/2019  Date Treatment Started: 05/09/2019  End of Certification Date: 07/07/2019  Sessions in Plan of Care: 16  Surgery Date: No data was found    Visit Count: 2   Diagnosis:   1. Bilateral low back pain, unspecified chronicity, unspecified whether sciatica present        Subjective     Social Support/Occupation  Lives in: multiple level home  Lives with: adult children  Occupation: Retired    Patient reports that her low back is feeling uncomfortable. She notes discomfort to her low back when sitting, walking, and performing ADLs.       Precautions: No data was found  Allergies: Patient has no known allergies.    Objective     Range of Motion     AROM: Lumbar Spine   05/12/2019    Lumbar Flexion 70    Lumbar Extension 7 pain   (blank fields were intentionally left blank)    05/12/2019   Left AROM 05/12/2019   Left PROM   Lumbar/Hip 05/12/2019   Right AROM 05/12/2019   Right PROM       Lumbar Rotation         120  Hip Flexion           Hip Extension           Hip Abduction           Hip Adduction         20 prone Hip IR   25      50 pain Hip ER   55    (blank fields were intentionally left blank)  05/12/2019 Pain with B thoracolumbar sidebending                 Treatment     Therapeutic Exercises   Justification: To increase strength and ROM.   Lumbar and Hip ROM assessed prior to treatment session.     DKTC 5 x 30 secs with verbal cuing on correct hand placement     Sitting thoracic extension 30 x 5 sec holds with verbal cuing to avoid going into increased lumbar lordosis      Neuromuscular Re-Education   Justification: To improve B hip/core muscle activation  Supine Bridges 3 x 10 with verbal cuing to avoid going into increased lumbar lordosis and to engage glutes     Supine Marches 3 x 10 with tactile cuing to ensure patient is engage deep  core musculature     PNF contract relax of B hamstrings 20 x 5 secs with tactile cuing to ensure patient engaged B VMO    Manual Therapy   Justification: To improve joint mobility and reduce soft tissue restrictions.  Strumming to TL junction in sitting     Strumming to B quadratus lumborum in B sidelying     Supine laying on the half foam roll for 3 mins    Therapeutic Activity   Justification: To improve sitting posture.   Patient instructed how to avoid sitting on her ischial tuberosities and to position herself on her pelvic floor. Patient instructed how to maintain a chin tuck and to incorporate diaphragmatic breathing.        ---      ---   Total Time   Timed Minutes  38 minutes   Total Time  38 minutes        Assessment   Patient had decreased pain with thoracolumbar sidebending after performing mobility work to her TL junction and after being cued to sit with proper posture. Patient had difficulty "finding" her pelvic floor but once hte proper cuing was used; patient felt less discomfort in her lumbar spine when tested with the EFT. Patient was instructed to maintain this posture throughout the day and to practice being in it. Patient was given an HEP at the end of the session.   Plan   Treatment sessions will focus on improving sitting posture and thoracolumbar ROM.       Goals    Goal 1: Pt will independently perform their home exercise program to maintain gains between PT visits.     05/12/2019 MJM   DKTC 30 secs 5 x   Sitting thoracic extension against chair 30 x 5 secs   Supine Bridges: 3 x 10   Supine Marches: 3 x 10   Sessions: 3      Goal 2: Pt will demonstrate L quad and hamstring strength of at least 4/5 to improve pt's ability to lift 15# from floor to waist-height for at least 10 repetitions using proper form to demonstrate improved ability to lift groceries from floor to counter.   Sessions: 9      Goal 3: Patient will improve B hip flexion strength to at least 4/5 to improve pt's ability to  negotiate 1 FOS using handrail, using reciprocal pattern.     Sessions: 16                                   Francis Dowse, DPT

## 2019-05-13 ENCOUNTER — Ambulatory Visit (INDEPENDENT_AMBULATORY_CARE_PROVIDER_SITE_OTHER): Payer: Medicare Other | Admitting: Family

## 2019-05-13 ENCOUNTER — Encounter (INDEPENDENT_AMBULATORY_CARE_PROVIDER_SITE_OTHER): Payer: Self-pay | Admitting: Family

## 2019-05-13 ENCOUNTER — Ambulatory Visit (INDEPENDENT_AMBULATORY_CARE_PROVIDER_SITE_OTHER): Payer: Medicare Other | Admitting: Internal Medicine

## 2019-05-13 VITALS — BP 100/62 | HR 83 | Temp 98.4°F | Resp 18 | Ht <= 58 in | Wt 109.8 lb

## 2019-05-13 DIAGNOSIS — G8929 Other chronic pain: Secondary | ICD-10-CM

## 2019-05-13 DIAGNOSIS — F17200 Nicotine dependence, unspecified, uncomplicated: Secondary | ICD-10-CM

## 2019-05-13 DIAGNOSIS — Z122 Encounter for screening for malignant neoplasm of respiratory organs: Secondary | ICD-10-CM

## 2019-05-13 DIAGNOSIS — M546 Pain in thoracic spine: Secondary | ICD-10-CM

## 2019-05-13 DIAGNOSIS — M25511 Pain in right shoulder: Secondary | ICD-10-CM

## 2019-05-13 DIAGNOSIS — M62838 Other muscle spasm: Secondary | ICD-10-CM

## 2019-05-13 NOTE — Progress Notes (Signed)
Have you seen any specialists/other providers since your last visit with [place]?    Yes, PT, Ortho    Arm preference verified?   Yes    The patient is due for spirometry, pneumonia vaccine and awv, adv dir

## 2019-05-13 NOTE — Progress Notes (Signed)
Subjective:      Patient ID: Janaya Triola is a 78 y.o. female     Chief Complaint   Patient presents with    Upper back pain     Referral to PT        Back Pain  This is a recurrent problem. The problem occurs intermittently. The problem is unchanged. The pain is present in the thoracic spine. The quality of the pain is described as aching. The pain is worse during the day. Pertinent negatives include no abdominal pain, bladder incontinence, chest pain, fever, headaches or numbness. She has tried home exercises (Does PT) for the symptoms. The treatment provided moderate relief.      Pt has been smoking more that 50 years, normal CXRY from 2/20 /18 was normal  The following sections were reviewed this encounter by the provider:   Tobacco   Allergies   Meds   Problems   Med Hx   Surg Hx   Fam Hx          Review of Systems   Constitutional: Negative for chills, fatigue and fever.   HENT: Negative for congestion.    Respiratory: Negative for cough, chest tightness, shortness of breath and wheezing.    Cardiovascular: Negative for chest pain, palpitations and leg swelling.   Gastrointestinal: Negative for abdominal pain, diarrhea, nausea and vomiting.   Genitourinary: Negative for bladder incontinence.   Musculoskeletal: Positive for arthralgias and back pain. Negative for gait problem, joint swelling, myalgias, neck pain and neck stiffness.   Neurological: Negative for dizziness, seizures, speech difficulty, numbness and headaches.          BP 100/62    Pulse 83    Temp 98.4 F (36.9 C) (Tympanic)    Resp 18    Ht 1.473 m (4\' 10" )    Wt 49.8 kg (109 lb 12.8 oz)    BMI 22.95 kg/m     Objective:     Physical Exam  Vitals signs and nursing note reviewed.   Constitutional:       Appearance: She is well-developed.   HENT:      Head: Normocephalic and atraumatic.   Cardiovascular:      Rate and Rhythm: Normal rate and regular rhythm.      Heart sounds: Normal heart sounds.   Pulmonary:      Effort: Pulmonary effort is  normal.      Breath sounds: No decreased air movement. Examination of the right-lower field reveals decreased breath sounds. Examination of the left-lower field reveals decreased breath sounds. Decreased breath sounds present.      Comments: Slightly diminished breath sounds BL lower lobes  Musculoskeletal:      Right shoulder: She exhibits tenderness, pain and spasm. She exhibits normal range of motion and no bony tenderness.      Thoracic back: She exhibits decreased range of motion, tenderness, pain and spasm.        Back:         Arms:    Skin:     General: Skin is warm and dry.   Neurological:      Mental Status: She is alert and oriented to person, place, and time.   Psychiatric:         Mood and Affect: Mood normal.         Behavior: Behavior normal.         Thought Content: Thought content normal.         Judgment: Judgment  normal.          Assessment:     1. Chronic bilateral thoracic back pain  - Referral to Physical Therapy-[facility]    2. Chronic right shoulder pain  - Referral to Physical Therapy-[facility]    3. Trapezius muscle spasm  - Referral to Physical Therapy-[facility]    4. Encounter for screening for malignant neoplasm of lung in current smoker with 30 pack year history or greater  - CT SCREENING LUNG LOW DOSE; Future        Plan:     1,2 3) Chronic, stable  Pt is currently doing lower back PT, would like to have PT for her shoulder and upper back.    4) Screening CT  Current everyday smoker      Medication list reviewed with patient and updated as indicated.  Risk & Benefits of any new medication(s) were explained to the patient who verbalized understanding & agreed to the treatment plan.  Patient given a printed copy of AVS. Patient verbalized understanding of instructions given.      Tangee Marszalek Berna Spare, NP

## 2019-05-17 ENCOUNTER — Other Ambulatory Visit (INDEPENDENT_AMBULATORY_CARE_PROVIDER_SITE_OTHER): Payer: Self-pay | Admitting: Internal Medicine

## 2019-05-17 DIAGNOSIS — E785 Hyperlipidemia, unspecified: Secondary | ICD-10-CM

## 2019-05-18 ENCOUNTER — Inpatient Hospital Stay: Payer: Medicare Other | Attending: Orthopaedic Surgery | Admitting: Rehabilitative and Restorative Service Providers"

## 2019-05-18 DIAGNOSIS — M545 Low back pain, unspecified: Secondary | ICD-10-CM

## 2019-05-18 NOTE — PT/OT Therapy Note (Signed)
Name: Michele [name] Age: 78 y.o.   Date of Service: 05/18/2019  Referring Physician: Marilynn Latino, MD   Date of Injury: 04/04/2019  Date Care Plan Established/Reviewed: 05/09/2019  Date Treatment Started: 05/09/2019  End of Certification Date: 07/07/2019  Sessions in Plan of Care: 16  Surgery Date: No data was found    Visit Count: 3   Diagnosis:   1. Bilateral low back pain, unspecified chronicity, unspecified whether sciatica present        Subjective     Social Support/Occupation  Lives in: multiple level home  Lives with: adult children  Occupation: Retired    Pt reports the best thing is after working on the L buttock muscle, it hurts. The L buttock pain is improved.   Pain is inconsistent, sometimes worse when standing, sometimes when sitting.       Precautions: No data was found  Allergies: Patient has no known allergies.    Objective   R PSIS deep, L IC elevated, L-rotated sacrum in standing    When walking, feels like it's "grabbing" in the back > "pinching" in back with walking post-tx and no grabbing reported: pt reported it felt better walking post-tx    L/S AROM pre-tx: 10 deg extension (20 deg lordosis) and painful >> 20 degrees extension post-tx and less painful              Treatment     Therapeutic Exercises   Justification: Improve l/s AROM, gluteal strength  Quadruped (pillow cushioning knees):: Cat/cow, cues for coordination, sequencing: x 15 reps, cues to avoid pain  Sidebending AROM R and L, cues for coordination, sequencing: x 15 reps, cues to avoid pain    Standing R and L hip abduction, cues for relaxed low back, utilizing gluteals with gluteal pinch prior, coordination: 2x10 each- holding onto table with B hands for balance     Neuromuscular Re-Education   Justification: Movement reeducation  Improve core activation  See MT box for further NMR    Ab series position 2 (modified, 1 foot on table with knee bent): 3x30" with cues for hand position, direction of force     APT/PPT at table x  25 reps, cues to avoid pain, disassociation of spine from pelvis    Manual Therapy   Justification: Improve soft tissue and joint mobility   Prone, 1 pillow supporting abdomen::     Hip on-axis PA to L hip with contract-relax into hip and ER with NMR doing isometric ER and COI at end-range (billed with NR)    Gr III R rotation L sacrum with hip rotation PROM, R rotation L coccyx with hip rotation PROM    STM R/L SIJ ligaments        ---      ---   Total Time   Timed Minutes  42 minutes   Total Time  42 minutes        Assessment   Patient reports pain in R SIJ region and PT notes malalignment pre-tx. Pt has limited l/s extension AROM pre-tx which improves post-tx and "grabbing pain" with walking also reduced post-tx. Pt tolerated all interventions well with no lingering symptoms afterward.    Plan   Review sitting posture, core stability  Gluteal strength      Goals    Goal 1: Pt will independently perform their home exercise program to maintain gains between PT visits.     05/12/2019 MJM   Access Code: [email or web address]  DKTC 30 secs  5 x   Sitting thoracic extension against chair 30 x 5 secs   Supine Bridges: 3 x 10   Supine Marches: 3 x 10   Sessions: 3      Goal 2: Pt will demonstrate L quad and hamstring strength of at least 4/5 to improve pt's ability to lift 15# from floor to waist-height for at least 10 repetitions using proper form to demonstrate improved ability to lift groceries from floor to counter.   Sessions: 9      Goal 3: Patient will improve B hip flexion strength to at least 4/5 to improve pt's ability to negotiate 1 FOS using handrail, using reciprocal pattern.     Sessions: 16                                   Candis Schatz, DPT

## 2019-05-24 ENCOUNTER — Inpatient Hospital Stay: Payer: Medicare Other | Attending: Orthopaedic Surgery | Admitting: Rehabilitative and Restorative Service Providers"

## 2019-05-24 DIAGNOSIS — M545 Low back pain, unspecified: Secondary | ICD-10-CM

## 2019-05-24 NOTE — PT/OT Therapy Note (Signed)
Name: Maryluz Tees Age: 78 y.o.   Date of Service: 05/24/2019  Referring Physician: Marilynn Latino, MD   Date of Injury: 04/04/2019  Date Care Plan Established/Reviewed: 05/09/2019  Date Treatment Started: 05/09/2019  End of Certification Date: 07/07/2019  Sessions in Plan of Care: 16  Surgery Date: No data was found    Visit Count: 4   Diagnosis:   1. Bilateral low back pain, unspecified chronicity, unspecified whether sciatica present        Subjective     Social Support/Occupation  Lives in: multiple level home and community based residential facility  Lives with: adult children  Occupation: Retired    Not much change. Going up/down stairs is easier than before since we started tx for low back. Pt reports since starting PT for low back, lifting from floor is a little easier but she still has to be careful.      Precautions: No data was found  Allergies: Patient has no known allergies.    Objective   Step up onto low step: 7/10 pain L with ascension and desension (PT notes antalgia) - pt holds handrail > 5/10 post-tx     Less pain post-tx in the low back             Treatment     Therapeutic Exercises   Justification: Improve l/s AROM, gluteal strength  Thomas position: L femoral nerve glide: x 20 reps- cues for positioning, to avoid pain    L pelvic clock in R S/L: AE/PD, cues for movement pattern, to avoid pain: x ~3'    Discussed d/c to MD over next 1-2 weeks if no/minimal improvement    Neuromuscular Re-Education   Justification: Movement reeducation  Improve core activation  L Pelvic posterior depression in R S/L with prolonged end range holds and COI, followed by  Pelvic PD with LE ext/ abd/IR with end range holds and COI to improve weight acceptance into LE > with axial load and resisted hip extension     Arch formation L foot with hip hinge forward, cues for quad activation-- onto low step x 15 reps, cues to avoid pain        Manual Therapy   Justification: Improve soft tissue and joint mobility   R S/L:  STM L iliacus, psoas with pelvic PD AROM as FMP  Gapping L SIJ with CR using L hip hike    STM L SIJ ligaments     Supine: STM quads with "the stick"        ---      ---   Total Time   Timed Minutes  42 minutes   Total Time  42 minutes        Assessment   Patient has pain in L SIJ with pelvic PD, which improves post-STM and gapping. Pt continues to demonstrate significant L quad STR with femoral nerve tension so PT added femoral nerve glide to HEP. Pt report less pain post-tx which returns between visits so PT updated HEP to allow pt to maintain gains. FOTO would be beneficial NV to determine progress. Pt tolerated all interventions well with no lingering symptoms afterward.    Plan   *ISSUE FOTO NV to determine progress  Continue  Core, quad/glute strengthening, L femoral nerve tracing      Goals    Goal 1: Pt will independently perform their home exercise program to maintain gains between PT visits.     Access Code: [email or web address]   URL: [email or web address]  Date: 05/24/2019   Prepared by: Carmell Austria      Exercises Seated Thoracic Lumbar Extension - 10 reps - 3 sets - 1x daily - 7x weekly   Supine Bridge - 10 reps - 3 sets - 1x daily - 7x weekly   Supine March - 10 reps - 3 sets - 1x daily - 7x weekly   Modified Thomas Stretch - 10 reps - 2 sets - 3x daily   Pelvic Clock - 10 reps - 2 sets - 2x daily - 14x weekly   Correct Seated Posture - 1x daily - 7x weekly      Sessions: 3      Goal 2: Pt will demonstrate L quad and hamstring strength of at least 4/5 to improve pt's ability to lift 15# from floor to waist-height for at least 10 repetitions using proper form to demonstrate improved ability to lift groceries from floor to counter.    Improving:: 11.17.20 MLE   Sessions: 9      Goal 3: Patient will improve B hip flexion strength to at least 4/5 to improve pt's ability to negotiate 1 FOS using handrail, using reciprocal pattern.    Improving strair negotiation per pt report  3+/5 L , 4-/5 R hip flexion  MMT pre-tx:: 11.17.20 MLE   Sessions: 16                                   Candis Schatz, DPT

## 2019-05-26 ENCOUNTER — Ambulatory Visit (INDEPENDENT_AMBULATORY_CARE_PROVIDER_SITE_OTHER): Payer: Medicare Other | Admitting: Family

## 2019-05-27 ENCOUNTER — Inpatient Hospital Stay: Payer: Medicare Other | Attending: Orthopaedic Surgery | Admitting: Rehabilitative and Restorative Service Providers"

## 2019-05-27 DIAGNOSIS — M545 Low back pain, unspecified: Secondary | ICD-10-CM

## 2019-05-27 NOTE — PT/OT Therapy Note (Signed)
Name: Michele [name] Age: 78 y.o.   Date of Service: 05/27/2019  Referring Physician: Marilynn Latino, MD   Date of Injury: 04/04/2019  Date Care Plan Established/Reviewed: 05/09/2019  Date Treatment Started: 05/09/2019  End of Certification Date: 07/07/2019  Sessions in Plan of Care: 16  Surgery Date: No data was found    Visit Count: 5   Diagnosis:   1. Bilateral low back pain, unspecified chronicity, unspecified whether sciatica present        Subjective     Social Support/Occupation  Lives in: multiple level home and community based residential facility  Lives with: adult children  Occupation: Retired    Patient reports that her low back is feeling better and that she is able to feel less pain and discomfort. However, she still notes tightness to her B buttocks and lower back muscles.       Precautions: No data was found  Allergies: Patient has no known allergies.                Treatment     Therapeutic Exercises   Justification: To increase strength and ROM.   DKTC 5 x 30 secs with verbal cuing on correct hand placement     Sitting thoracic extension 30 x 5 sec holds with verbal cuing to avoid going into increased lumbar lordosis      Neuromuscular Re-Education   Justification: To improve B hip/core muscle activation  Supine Bridges on PB 3 x 10 with verbal cuing to avoid going into increased lumbar lordosis and to engage glutes     Supine Bicycles 3 x 10 with tactile cuing to ensure patient is engage deep core musculature     PNF contract relax of B hamstrings 20 x 5 secs with tactile cuing to ensure patient engaged B VMO    Manual Therapy   Justification: To improve joint mobility and reduce soft tissue restrictions.  Strumming to TL junction in sitting     Strumming to B quadratus lumborum in B sidelying           Therapeutic Activity   Justification: To improve hip hinging mechanics.   Patient instructed how to hip hinge in sitting by avoiding straining her lumbar spine and to use her B LEs when  bringing her torso anteriorly and posteriorly.        ---      ---   Total Time   Timed Minutes  38 minutes   Total Time  38 minutes        Assessment   Patient is noted to have decreased soft tissue restrictions to her lumbar spine since starting physical therapy. She also has increased thoracic mobility since starting physical therapy. She is noted to have improved B hip/core endurance since starting therapy. However, she is still noted to overly recruit her lumbar extensors when sitting and hip hinging. Patient was instructed how to use her B LEs to activate when hinging.   Plan   Treatment sessions will focus on improving B hip/core stability to lower lumbar segments.       Goals    Goal 1: Pt will independently perform their home exercise program to maintain gains between PT visits.     Access Code: [email or web address]   URL: [email or web address]   Date: 05/24/2019   Prepared by: Carmell Austria      Exercises Seated Thoracic Lumbar Extension - 10 reps - 3 sets - 1x daily - 7x weekly   Supine Bridge -  10 reps - 3 sets - 1x daily - 7x weekly   Supine March - 10 reps - 3 sets - 1x daily - 7x weekly   Modified Thomas Stretch - 10 reps - 2 sets - 3x daily   Pelvic Clock - 10 reps - 2 sets - 2x daily - 14x weekly   Correct Seated Posture - 1x daily - 7x weekly      Sessions: 3      Goal 2: Pt will demonstrate L quad and hamstring strength of at least 4/5 to improve pt's ability to lift 15# from floor to waist-height for at least 10 repetitions using proper form to demonstrate improved ability to lift groceries from floor to counter.    Improving:: 11.17.20 MLE   Sessions: 9      Goal 3: Patient will improve B hip flexion strength to at least 4/5 to improve pt's ability to negotiate 1 FOS using handrail, using reciprocal pattern.    Improving strair negotiation per pt report  3+/5 L , 4-/5 R hip flexion MMT pre-tx:: 11.17.20 MLE   Sessions: 16                                   Francis Dowse, DPT

## 2019-05-31 ENCOUNTER — Inpatient Hospital Stay: Payer: Medicare Other | Attending: Orthopaedic Surgery | Admitting: Rehabilitative and Restorative Service Providers"

## 2019-05-31 DIAGNOSIS — M545 Low back pain, unspecified: Secondary | ICD-10-CM

## 2019-05-31 NOTE — PT/OT Therapy Note (Signed)
Name: Michele [name] Age: 78 y.o.   Date of Service: 05/31/2019  Referring Physician: Marilynn Latino, MD   Date of Injury: 04/04/2019  Date Care Plan Established/Reviewed: 05/09/2019  Date Treatment Started: 05/09/2019  End of Certification Date: 07/07/2019  Sessions in Plan of Care: 16  Surgery Date: No data was found    Visit Count: 6   Diagnosis:   1. Bilateral low back pain, unspecified chronicity, unspecified whether sciatica present    2. Chronic low back pain, unspecified back pain laterality, unspecified whether sciatica present        Subjective     Social Support/Occupation  Lives in: multiple level home and community based residential facility  Lives with: adult children  Occupation: Retired    Patient reports that she is feeling a lot of stiffness in her upper back. Patient reports that she was sitting for more than an hour which may have triggered this stiffness.       Precautions: No data was found  Allergies: Patient has no known allergies.    Objective     Range of Motion       05/31/2019   Left AROM Cervical/Thoracic 05/31/2019   Right AROM     Cervical Lateral Flexion       Cervical Rotation       Thoracic Lateral Flexion     35  Thoracic Rotation 35    (blank fields were intentionally left blank)                 Treatment     Therapeutic Exercises   Justification: To increase strength and ROM.   Thoracic rotation assessed before treatment session    DKTC 5 x 30 secs with verbal cuing on correct hand placement     B sidelying thoracic rotation 10 x 10 secs with verbal cuing to avoid going into increased lumbar rotation    Neuromuscular Re-Education   Justification: To improve B hip/core muscle activation  PNF scapular PD on B sides setting the scapula and having patient perform isometric holds against resistance.     Supine Bridges on PB 3 x 10 with verbal cuing to avoid going into increased lumbar lordosis and to engage glutes       Manual Therapy   Justification: To improve joint mobility and  reduce soft tissue restrictions.  Grade III/IV PA mobs to T1-T12 in prone     Strumming to B QLs in prone    Therapeutic Activity   Justification: To improve hip hinging mechanics.  Patient instructed how to hip hinge in sitting by avoiding straining her lumbar spine and to use her B LEs when bringing her torso anteriorly and posteriorly.     A 5# weight was added to this routine.        ---      ---   Total Time   Timed Minutes  38 minutes   Total Time  38 minutes        Assessment   Patient is still noted to have discomfort to her thoracolumbar spine although her rotation improved after manual treatment session. Patient is also noted to have improved hip hinging mechancis in sitting since first starting therapy. However, patient still reports that she gets discomfort to her lumbar spine with increased extension. Patient was instructed to perform her mobility exercises daily to maintain her new ROM.   Plan   Treatment sessions will focus on B hip/core strengthening. The possibility of being discharged will  be discussed with the patient in the next treatment session.      Goals    Goal 1: Pt will independently perform their home exercise program to maintain gains between PT visits.     Access Code: [email or web address]   URL: [email or web address]   Date: 05/24/2019   Prepared by: Carmell Austria      Exercises Seated Thoracic Lumbar Extension - 10 reps - 3 sets - 1x daily - 7x weekly   Supine Bridge - 10 reps - 3 sets - 1x daily - 7x weekly   Supine March - 10 reps - 3 sets - 1x daily - 7x weekly   Modified Thomas Stretch - 10 reps - 2 sets - 3x daily   Pelvic Clock - 10 reps - 2 sets - 2x daily - 14x weekly   Correct Seated Posture - 1x daily - 7x weekly      Sessions: 3      Goal 2: Pt will demonstrate L quad and hamstring strength of at least 4/5 to improve pt's ability to lift 15# from floor to waist-height for at least 10 repetitions using proper form to demonstrate improved ability to lift groceries from floor to  counter.    Improving:: 11.17.20 MLE   Sessions: 9      Goal 3: Patient will improve B hip flexion strength to at least 4/5 to improve pt's ability to negotiate 1 FOS using handrail, using reciprocal pattern.    Improving strair negotiation per pt report  3+/5 L , 4-/5 R hip flexion MMT pre-tx:: 11.17.20 MLE   Sessions: 16                                   Francis Dowse, DPT

## 2019-06-06 ENCOUNTER — Inpatient Hospital Stay: Payer: Medicare Other | Attending: Orthopaedic Surgery | Admitting: Rehabilitative and Restorative Service Providers"

## 2019-06-06 DIAGNOSIS — M545 Low back pain, unspecified: Secondary | ICD-10-CM

## 2019-06-06 DIAGNOSIS — G8929 Other chronic pain: Secondary | ICD-10-CM | POA: Insufficient documentation

## 2019-06-06 NOTE — Progress Notes (Addendum)
Name:Michele [name] Age: 78 y.o.   Date of Service: 06/06/2019  Referring Physician: Marilynn Latino, MD   Date of Injury: 04/04/2019  Date Care Plan Established/Reviewed: 05/09/2019  Date Treatment Started: 05/09/2019  End of Certification Date: 07/07/2019  Sessions in Plan of Care: 16  Surgery Date: No data was found      Visit Count: 7   Diagnosis:   1. Bilateral low back pain, unspecified chronicity, unspecified whether sciatica present    2. Chronic low back pain, unspecified back pain laterality, unspecified whether sciatica present      Discontinuation of Therapy Services    Michele [name] did not complete prescribed physical therapy visits.      Status is unknown at this time, physical therapy has been discontinued and patient has been discharged from care.  The last therapy note is below for review.    Please feel free to contact me with any questions regarding the care of Michele [name].    Sincerely,    [name] PT, DPT        06/06/2019          Subjective     Social Support/Occupation  Lives in: multiple level home and community based residential facility  Lives with: adult children  Occupation: Retired    Patient reports that her low back felt a little sore after her l;ast treatment session. She feels that her low back has hit a plateau since it still feels uncomfortable when performing ADLs.       Precautions: No data was found  Allergies: Patient has no known allergies.    Past Medical History:   Diagnosis Date    Abnormal EKG     Arthralgia     Barrett's esophagus     Cervical spinal stenosis     Colonic polyp     Depression     Disc disorder     Gastroesophageal reflux disease     Lower back pain     Numbness and tingling of both legs 02/06/2015    Osteoarthritis     Osteopenia        Objective     Range of Motion     AROM: Lumbar Spine   06/06/2019    Lumbar Flexion 70 pain   Lumbar Extension 12 pain   (blank fields were intentionally left blank)    06/06/2019   Left AROM    Left PROM    Lumbar/Hip 06/06/2019   Right AROM    Right PROM   45    Lumbar Rotation 45          Hip Flexion           Hip Extension           Hip Abduction           Hip Adduction           Hip IR           Hip ER       (blank fields were intentionally left blank)  06/06/2019 Lumbar rotation assessed with goniometer while other motions were assessed with inclinometer        Strength     06/06/2019   Left Strength  Hip  MMT 06/06/2019   Right   4 pain Hip Flexion 4 pain     Hip Extension       Hip Abduction       Hip Adduction       Hip IR  Hip ER       Quadriceps       Hamstrings     (blank fields were intentionally left blank)             Treatment     Therapeutic Exercises   Justification: To increase strength and ROM.   Foto completed by patient    DKTC 5 x 30 secs with verbal cuing on correct hand placement     HEP reviewed to ensure patient compliance    Neuromuscular Re-Education   Justification: To improve B hip/core muscle activation  PNF scapular PD on B sides setting the scapula and having patient perform isometric holds against resistance.     Supine Bridges on PB 3 x 10 with verbal cuing to avoid going into increased lumbar lordosis and to engage glutes       Manual Therapy   Justification: To improve joint mobility and reduce soft tissue restrictions.  Grade III/IV PA mobs to T1-T12 in prone     Strumming to B QLs in prone    Therapeutic Activity   Justification: To improve hip hinging mechanics.  Patient instructed how to hip hinge in sitting by avoiding straining her lumbar spine and to use her B LEs when bringing her torso anteriorly and posteriorly.     A 6# weight was added to this routine.        ---      ---   Total Time   Timed Minutes  38 minutes   Total Time  38 minutes        Assessment   Patient has been seen for 7 visits in this case from 05/09/2019 until 06/06/2019. The primary encounter diagnosis was Bilateral low back pain, unspecified chronicity, unspecified whether sciatica present. A  diagnosis of Chronic low back pain, unspecified back pain laterality, unspecified whether sciatica present was also pertinent to this visit.  Patient has made good progress in pain, posture, range of motion, strength, joint mobility and soft tissue mobility since the initial evaluation. Patient would benefit from continued skilled PT intervention to address these deficits and achieve the below listed goals. This will be patient's last treatment session.       Plan   Patient will be [place]'ed with HEP.       Goals    Goal 1: Pt will independently perform their home exercise program to maintain gains between PT visits.     Access Code: [email or web address]   URL: [email or web address]   Date: 05/24/2019   Prepared by: Carmell Austria      Exercises Seated Thoracic Lumbar Extension - 10 reps - 3 sets - 1x daily - 7x weekly   Supine Bridge - 10 reps - 3 sets - 1x daily - 7x weekly   Supine March - 10 reps - 3 sets - 1x daily - 7x weekly   Modified Thomas Stretch - 10 reps - 2 sets - 3x daily   Pelvic Clock - 10 reps - 2 sets - 2x daily - 14x weekly   Correct Seated Posture - 1x daily - 7x weekly      Sessions: 3      Goal 2: Pt will demonstrate L quad and hamstring strength of at least 4/5 to improve pt's ability to lift 15# from floor to waist-height for at least 10 repetitions using proper form to demonstrate improved ability to lift groceries from floor to counter.    Improving:: 11.17.20 MLE   Sessions: 9  Goal 3: Patient will improve B hip flexion strength to at least 4/5 to improve pt's ability to negotiate 1 FOS using handrail, using reciprocal pattern.    B hip flexion tested at a 4/5. 06/06/2019 MJM   Sessions: 16                                   Francis Dowse, DPT

## 2019-06-15 ENCOUNTER — Other Ambulatory Visit (INDEPENDENT_AMBULATORY_CARE_PROVIDER_SITE_OTHER): Payer: Self-pay | Admitting: Internal Medicine

## 2019-06-15 DIAGNOSIS — E785 Hyperlipidemia, unspecified: Secondary | ICD-10-CM

## 2019-06-17 ENCOUNTER — Encounter (INDEPENDENT_AMBULATORY_CARE_PROVIDER_SITE_OTHER): Payer: Self-pay

## 2019-06-28 ENCOUNTER — Other Ambulatory Visit (INDEPENDENT_AMBULATORY_CARE_PROVIDER_SITE_OTHER): Payer: Self-pay | Admitting: Internal Medicine

## 2019-06-28 DIAGNOSIS — K2270 Barrett's esophagus without dysplasia: Secondary | ICD-10-CM

## 2019-07-17 ENCOUNTER — Other Ambulatory Visit (INDEPENDENT_AMBULATORY_CARE_PROVIDER_SITE_OTHER): Payer: Self-pay | Admitting: Internal Medicine

## 2019-07-17 DIAGNOSIS — E785 Hyperlipidemia, unspecified: Secondary | ICD-10-CM

## 2019-07-21 ENCOUNTER — Ambulatory Visit (INDEPENDENT_AMBULATORY_CARE_PROVIDER_SITE_OTHER): Payer: Medicare Other | Admitting: Family

## 2019-07-21 ENCOUNTER — Encounter (INDEPENDENT_AMBULATORY_CARE_PROVIDER_SITE_OTHER): Payer: Self-pay | Admitting: Family

## 2019-07-21 VITALS — BP 129/69 | HR 78 | Temp 98.3°F | Resp 20 | Ht 58.35 in | Wt 108.5 lb

## 2019-07-21 DIAGNOSIS — Z789 Other specified health status: Secondary | ICD-10-CM

## 2019-07-21 DIAGNOSIS — E46 Unspecified protein-calorie malnutrition: Secondary | ICD-10-CM

## 2019-07-21 DIAGNOSIS — J449 Chronic obstructive pulmonary disease, unspecified: Secondary | ICD-10-CM

## 2019-07-21 DIAGNOSIS — G959 Disease of spinal cord, unspecified: Secondary | ICD-10-CM | POA: Insufficient documentation

## 2019-07-21 DIAGNOSIS — R0789 Other chest pain: Secondary | ICD-10-CM

## 2019-07-21 LAB — VITAMIN B12: Vitamin B-12: >2000 pg/mL — ABNORMAL HIGH (ref 211–911)

## 2019-07-21 LAB — HEMOLYSIS INDEX: Hemolysis Index: 12 (ref 0–18)

## 2019-07-21 NOTE — Progress Notes (Signed)
Have you seen any specialists/other providers since your last visit with [place]?    Yes Ophthalmology    Arm preference verified?   Yes    The patient is due for spirometry, shingles vaccine and AD, CIVID-19 vaccine, AWV

## 2019-07-21 NOTE — Progress Notes (Signed)
Subjective:      Patient ID: Michele [name] is a 79 y.o. female     Chief Complaint   Patient presents with    Numbness     chronic intermittent mid chest to right arm         HPI   Patient is complaining of midsternal numbness which radiates to right chest intermittently.  The sensation lasts about 2 to 3 seconds.  Denies chest pain, shortness of breath, palpitations and dizziness.  Patient's primary cardiologist Dr. Carlota Raspberry   She was evaluated for the same complaint last year by her cardiologist.  Had a echocardiogram 2017 per patient results were all normal.  She is currently smoking every day 5 to 8 cigarettes  Does not have intentions to quit smoking.  The following sections were reviewed this encounter by the provider:   Tobacco   Allergies   Meds   Problems   Med Hx   Surg Hx   Fam Hx          Review of Systems   Constitutional: Negative for chills, fatigue and fever.   HENT: Negative for congestion.    Respiratory: Negative for cough, chest tightness, shortness of breath and wheezing.    Cardiovascular: Positive for chest pain (Numbness in the middle of the chest that radiates to the right side). Negative for palpitations and leg swelling.   Gastrointestinal: Negative for diarrhea, nausea and vomiting.   Musculoskeletal: Negative for arthralgias.   Neurological: Negative for dizziness, seizures, speech difficulty, numbness and headaches.          BP 129/69 (BP Site: Right arm, Patient Position: Sitting, Cuff Size: Small)    Pulse 78    Temp 98.3 F (36.8 C) (Oral)    Resp 20    Ht 1.482 m (4' 10.35")    Wt 49.2 kg (108 lb 7.5 oz)    SpO2 97%    BMI 22.40 kg/m     Objective:     Physical Exam  Vitals signs and nursing note reviewed.   Constitutional:       Appearance: She is well-developed.   HENT:      Head: Normocephalic and atraumatic.   Cardiovascular:      Rate and Rhythm: Normal rate and regular rhythm.      Heart sounds: Normal heart sounds.   Pulmonary:      Effort: Pulmonary effort is normal.       Breath sounds: Normal breath sounds.   Skin:     General: Skin is warm and dry.   Neurological:      Mental Status: She is alert and oriented to person, place, and time.   Psychiatric:         Behavior: Behavior normal.         Thought Content: Thought content normal.         Judgment: Judgment normal.          Assessment:     1. Vegetarian diet  - Vitamin B12    2. Chronic obstructive pulmonary disease, unspecified COPD type    3. Protein-calorie malnutrition, unspecified severity  - Vitamin B12    4. Atypical chest pain  - Referral to Cardiology - EXTERNAL        Plan:     1. Due to vegetarian diet will rule out B12 deficiency    2.  Chronic stable  Current everyday smoker    2.  Frail appearance, admits to eating small amounts of  food    4.  Recurrent stable  Referral is provided to evaluated further by cardiology      Medication list reviewed with patient and updated as indicated.  Risk & Benefits of any new medication(s) were explained to the patient who verbalized understanding & agreed to the treatment plan.  Patient given a printed copy of AVS. Patient verbalized understanding of instructions given.      Zaara Sprowl Berna Spare, NP

## 2019-07-27 ENCOUNTER — Telehealth (INDEPENDENT_AMBULATORY_CARE_PROVIDER_SITE_OTHER): Payer: Self-pay | Admitting: Family

## 2019-07-27 NOTE — Telephone Encounter (Signed)
Pt called to speak w/ the nurse about her labs. Nurse was unavailable to speak and pt would like a call back when available

## 2019-07-27 NOTE — Telephone Encounter (Signed)
Please call PT back about her lab results  .

## 2019-07-27 NOTE — Telephone Encounter (Signed)
Pt returning a call regarding her lab results.  Pt can be reached at [phone].

## 2019-07-28 NOTE — Telephone Encounter (Signed)
Called patient and no answer.   Left message requesting call back.     Constance Goltz, LPN   1/61/0960 8:22 AM      No answer. Left message to call the office.    Francisca December K, NP   07/22/2019 10:20 AM      Elevated b12 levels, stop taking supplements

## 2019-07-28 NOTE — Telephone Encounter (Signed)
Patient notified of lab results

## 2019-08-04 ENCOUNTER — Ambulatory Visit (INDEPENDENT_AMBULATORY_CARE_PROVIDER_SITE_OTHER): Payer: Medicare Other | Admitting: Family

## 2019-08-04 ENCOUNTER — Encounter (INDEPENDENT_AMBULATORY_CARE_PROVIDER_SITE_OTHER): Payer: Self-pay | Admitting: Family

## 2019-08-04 VITALS — BP 122/67 | HR 62 | Temp 98.1°F | Resp 16 | Ht 58.43 in | Wt 108.2 lb

## 2019-08-04 DIAGNOSIS — Z0000 Encounter for general adult medical examination without abnormal findings: Secondary | ICD-10-CM

## 2019-08-04 DIAGNOSIS — E785 Hyperlipidemia, unspecified: Secondary | ICD-10-CM

## 2019-08-04 DIAGNOSIS — J449 Chronic obstructive pulmonary disease, unspecified: Secondary | ICD-10-CM

## 2019-08-04 LAB — CBC
Absolute NRBC: 0 10*3/uL (ref 0.00–0.00)
Hematocrit: 41.7 % (ref 34.7–43.7)
Hgb: 13 g/dL (ref 11.4–14.8)
MCH: 29.5 pg (ref 25.1–33.5)
MCHC: 31.2 g/dL — ABNORMAL LOW (ref 31.5–35.8)
MCV: 94.6 fL (ref 78.0–96.0)
MPV: 12.4 fL (ref 8.9–12.5)
Nucleated RBC: 0 /100 WBC (ref 0.0–0.0)
Platelets: 151 10*3/uL (ref 142–346)
RBC: 4.41 10*6/uL (ref 3.90–5.10)
RDW: 12 % (ref 11–15)
WBC: 4.7 10*3/uL (ref 3.10–9.50)

## 2019-08-04 NOTE — Progress Notes (Signed)
Subjective:       Michele [name] is a 79 y.o. female and is here for a comprehensive physical exam. 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    COPD (chronic obstructive pulmonary disease) 11/26/2015    Tobacco abuse 03/05/2016    Hoffman sign present 09/12/2016    Hyperlipidemia 04/28/2018    Benign neoplasm of rectum 03/20/2017    Benign neoplasm of transverse colon 03/20/2017    Pain in elbow 03/23/2014    Hiatal hernia 03/20/2017    Paresthesia of lower extremity 02/06/2015    Cervical myelopathy 07/21/2019     Resolved Ambulatory Problems     Diagnosis Date Noted    Thrombocytopenia 02/18/2013    Bilateral low back pain, unspecified chronicity, unspecified whether sciatica present 09/18/2014    Generalized weakness 06/16/2016    Muscle weakness 07/14/2016    Bilateral low back pain with left-sided sciatica, unspecified chronicity 06/22/2018    Complex tear of medial meniscus of left knee as current injury, subsequent encounter 03/23/2019    Other specified aftercare following surgery 03/23/2019    Chronic back pain 02/18/2013     Past Medical History:   Diagnosis Date    Abnormal EKG     Arthralgia     Barrett's esophagus     Cervical spinal stenosis     Colonic polyp     Depression     Disc disorder     Gastroesophageal reflux disease     Lower back pain     Numbness and tingling of both legs 02/06/2015    Osteoarthritis      Past Surgical History:   Procedure Laterality Date    APPENDECTOMY  1960?    when was a teenager    TUBAL LIGATION  1973       Do you take any herbs or supplements that were not prescribed by a doctor? yes  Are you taking calcium supplements? no  Are you taking aspirin daily? yes    GU History:  Any  STD's in the past? none  Lives with her daughter and her husband  Drives  Diet: Mediterranean food  Exercise: walking when weather is warm  Sleep: 6-7 hours    The following portions of the patient's history were reviewed and updated as appropriate: allergies, current medications, past family history, past medical history, past social history, past surgical history and problem list.    Review of Systems  Do you have pain that bothers you in your daily life? yes  Pertinent items are noted in HPI.     Objective:      BP 122/67 (BP Site: Right arm, Patient Position: Sitting, Cuff Size: Small)    Pulse 62    Temp 98.1 F (36.7 C) (Oral)    Resp 16    Ht 1.484 m (4' 10.43")    Wt 49.1 kg (108 lb 3.9 oz)    SpO2 96%    BMI 22.30 kg/m     General Appearance:    Alert, cooperative, no distress, appears stated age   Head:    Normocephalic, without obvious abnormality, atraumatic   Eyes:    PERRL, conjunctiva/corneas clear, EOM's intact, fundi  benign, both eyes   Ears:    Normal TM's and external ear canals, both ears   Nose:   Nares normal, septum midline, mucosa normal, no drainage    or sinus tenderness   Throat:   Lips, mucosa, and tongue normal; teeth and gums normal   Neck:   Supple, symmetrical, trachea midline, no adenopathy;     thyroid:  no enlargement/tenderness/nodules; no carotid    bruit or JVD   Back:     Symmetric, no curvature, ROM normal, no CVA tenderness   Lungs:     Clear to auscultation bilaterally, respirations unlabored   Chest Wall:    No tenderness or deformity    Heart:    Regular rate and rhythm, S1 and S2 normal, no murmur, rub   or gallop   Breast Exam:    No tenderness, masses, or nipple abnormality   Abdomen:     Soft, non-tender, bowel sounds active all four quadrants,     no masses, no organomegaly   Genitalia:  Deferred by pt   Rectal:  Not indicated   Extremities:   Extremities normal, atraumatic, no cyanosis or edema   Pulses:   2+ and symmetric all extremities   Skin:   Skin color,  texture, turgor normal, no rashes or lesions   Lymph nodes:   Cervical, supraclavicular, and axillary nodes normal   Neurologic:   CNII-XII intact, normal strength, sensation and reflexes     throughout         Assessment:      Healthy female exam.   1. Well adult exam  Basic Metabolic Panel    CBC without differential    Lipid panel   2. Hyperlipidemia, unspecified hyperlipidemia type  Lipid panel   3. Chronic obstructive pulmonary disease, unspecified COPD type  Basic Metabolic Panel    CBC without differential    .  Plan:      1. Labs pending  2. Patient Counseling:  --Nutrition: Stressed importance of moderation in sodium/caffeine intake, saturated fat and cholesterol, caloric balance, sufficient intake of fresh fruits, vegetables, fiber, calcium, iron, and 1 mg of folate supplement per day (for females capable of pregnancy).  --Discussed the issue of estrogen replacement, calcium supplement, and the daily use of baby aspirin.  --Exercise: Stressed the importance of regular exercise.   --Substance Abuse: Discussed cessation/primary prevention of tobacco, alcohol, or other drug use; driving or other dangerous activities under the influence; availability of treatment for abuse.    --Sexuality: Discussed sexually transmitted diseases, partner selection, use of condoms, avoidance of unintended pregnancy  and contraceptive alternatives.   --Injury prevention: Discussed safety belts, safety helmets, smoke detector, smoking near bedding or upholstery.   --Dental health: Discussed importance of regular tooth brushing, flossing, and dental visits.  --Immunizations reviewed.  --Discussed benefits of screening colonoscopy.  --After hours service discussed with patient    Breast exam is completed, patient is due for mammogram in March.  Advised to contact office for mammogram order  3. Discussed the patient's BMI with her.  The BMI is in the acceptable range  4. Follow up as needed for acute illness

## 2019-08-04 NOTE — Progress Notes (Signed)
Have you seen any specialists/other providers since your last visit with us?    No    Arm preference verified?   Yes    The patient is due for spirometry, shingles vaccine and AD,AWV

## 2019-08-05 LAB — BASIC METABOLIC PANEL
Anion Gap: 8 (ref 5.0–15.0)
BUN: 18 mg/dL (ref 7.0–19.0)
CO2: 30 mEq/L — ABNORMAL HIGH (ref 21–29)
Calcium: 9 mg/dL (ref 7.9–10.2)
Chloride: 103 mEq/L (ref 100–111)
Creatinine: 0.7 mg/dL (ref 0.4–1.5)
Glucose: 79 mg/dL (ref 70–100)
Potassium: 4 mEq/L (ref 3.5–5.1)
Sodium: 141 mEq/L (ref 136–145)

## 2019-08-05 LAB — LIPID PANEL
Cholesterol / HDL Ratio: 2.5
Cholesterol: 172 mg/dL (ref 0–199)
HDL: 68 mg/dL (ref 40–9999)
LDL Calculated: 91 mg/dL (ref 0–99)
Triglycerides: 64 mg/dL (ref 34–149)
VLDL Calculated: 13 mg/dL (ref 10–40)

## 2019-08-05 LAB — GFR: EGFR: >60

## 2019-08-05 LAB — HEMOLYSIS INDEX: Hemolysis Index: 10 (ref 0–18)

## 2019-08-08 ENCOUNTER — Other Ambulatory Visit (INDEPENDENT_AMBULATORY_CARE_PROVIDER_SITE_OTHER): Payer: Self-pay | Admitting: Family

## 2019-08-08 DIAGNOSIS — E785 Hyperlipidemia, unspecified: Secondary | ICD-10-CM

## 2019-08-08 MED ORDER — ATORVASTATIN CALCIUM 10 MG PO TABS
10.0000 mg | ORAL_TABLET | Freq: Every day | ORAL | 1 refills | Status: DC
Start: 2019-08-08 — End: 2020-05-24

## 2019-08-09 ENCOUNTER — Encounter (INDEPENDENT_AMBULATORY_CARE_PROVIDER_SITE_OTHER): Payer: Self-pay | Admitting: Family

## 2019-08-16 ENCOUNTER — Ambulatory Visit (INDEPENDENT_AMBULATORY_CARE_PROVIDER_SITE_OTHER): Payer: Medicare Other | Admitting: Neurology

## 2019-09-01 ENCOUNTER — Encounter (INDEPENDENT_AMBULATORY_CARE_PROVIDER_SITE_OTHER): Payer: Self-pay | Admitting: Neurology

## 2019-09-01 ENCOUNTER — Ambulatory Visit (INDEPENDENT_AMBULATORY_CARE_PROVIDER_SITE_OTHER): Payer: Medicare Other | Admitting: Neurology

## 2019-09-01 VITALS — Ht <= 58 in | Wt 108.0 lb

## 2019-09-01 DIAGNOSIS — G959 Disease of spinal cord, unspecified: Secondary | ICD-10-CM

## 2019-09-01 DIAGNOSIS — R251 Tremor, unspecified: Secondary | ICD-10-CM

## 2019-09-01 MED ORDER — PROPRANOLOL HCL ER 60 MG PO CP24
60.00 mg | ORAL_CAPSULE | Freq: Every day | ORAL | 3 refills | Status: DC
Start: 2019-09-01 — End: 2019-10-13

## 2019-09-01 NOTE — Progress Notes (Signed)
Subjective:      79 y/o R handed F with hx osteopenia, tobacco use, presents to Movement Disorders clinic for f/u of tremors mainly in her legs when standing (L>R).    Continues to have issues with 'feeling of shaking' through her body which comes and goes, and bothers her. Is the same from years ago.  She is 'sleeping ok.'     Has had a knee replacement since last visit, but her knee 'bothers me'.  Saw neurosurgery who did not recommend surgery for cervical spinal stenosis.     History retained from initial encounter:     She noticed the tremors around 3 years ago.  Started 'slow' but are getting worse.  She would feel she was nervous and her head was shaking.  Now, she can't stand or move without her whole body shaking vigorously, mainly in the legs. Makes her balance off. Legs start shaking when she stands up. When she is lying in bed, she can feel her body shaking. It shakes utensils, has to be careful with soup. She has felt her handwriting has changed, it is shaking. She also feels muscle cramps in her hands and legs. No reduction in dexterity.     Does not typically happen when sitting and improves when walking around.  However, if she is standing still, especially if she puts pressure on her legs, her left leg will start to shake in particular and sometimes it will lead to her whole body shaking.  She feels that it is much worse in her left leg as compared to her right leg.  She denies any specific numbness. She denies any specific weakness.  No falls and feels like she is walking okay.  The shaking again seems to mainly be when she is standing still.  She has a long history of back pain with burning and tingling down into both legs.  She has no shooting pain down the legs.  No specific neck pain, but does feel a bit stiff.  No shooting pain down the arms.  No saddle anesthesia.  No loss of bowel or bladder control.  No specific rigidity in arms or legs.  Does not get dizzy when standing.  No specific  change in gait.  No changes in face.    No change to sense of smell, no constipation.  Sleep is good, not fragmented. She does feel burning and tingles in her legs; both legs. Can be painful. + fatigue. No falls. Had the flu last week, which made her especially weak. + occasional LH/D on standing.     No changes to medication or illnesses 3 yrs ago. Unclear pulmonary history, has not used inhalers in years.  Used it once last week d/t influenza A.     + heavy smoker, no EtOH use. + sister with tremors, who is younger than her. No other family hx noted.     MRI Brain reviewed, 10/23/16:    FINDINGS:   There is minimal diffuse atrophy with small vessels ischemic changes.   The ventricular system is normal in size and contour.   There is no acute intracranial hemorrhage. There is no herniation.   There are no extra-axial fluid collections.   There are no concerning areas of abnormal signal intensity.   The diffusion sequences show no restriction to suggest an acute infarct.    The cerebellar tonsils are normally positioned.  There is mild mucosal thickening involving the bilateral maxillary  sinuses. The mastoid air cells are  clear.  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    IMPRESSION:     No acute intracranial abnormalities. There is diffuse  atrophy with small vessels changes.    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 Please see above  for further details.        The following portions of the patient's history were reviewed and updated as appropriate: allergies, past family history, past medical history, past social history, past surgical history and problem list.    Review of Systems  + leg tremors  No recent illness. Denies fever, chills, cough, sinus pain, eye pain, eye redness, ear pain,  rhinorrhea, sore throat, chest pain, SOB, wheezing, abd pain, Nausea, Vomiting, diarrhea, constipation, dysuria, or rashes.  All other systems reviewed and are negative except as previously noted in the HPI.    Objective:     Constitutional: NAD   Eyes: Clear conjunctiva  Musculoskeletal: Normal posture and muscle bulk.  Integumentary: No abnormal rash noted  Psych: Flat affect    Neurological:   Language: Spontaneous & fluent speech, good comprehension.    Cranial Nerves:  III, IV, VI: EOMI, No nystagmus, no palsies, no ptosis  V: Intact to LT V1-V3 distribution bilaterally.   VI: Symmetrical face and expression.   VIII: Hearing intact to finger rub bilaterally.   IX, X: Palate/Uvula elevates symmetrically.   XI: 5/5 Trapezius & SCM bilaterally.   XII: Tongue is midline.     Motor Exam: Normal bulk and tone. No clonus. No pronator drift. Strength 5/5 throughout and symmetric. ? weakness in grip strength, intrinsic muscle atrophy in hands. Weakness L leg in hip and knee.    Sensation: Sensation to LT intact grossly through symmetrically. Romberg positive.     Cerebellar:   Finger to Nose: intact bilaterally, with slight terminal dysmetria bilaterally.     Reflexes: 2+ throughout    Station/ Gait: Wider based, ataxic component, looking down for balance. Tremulous throughout with gait, ? R sided tremor.     Full body tremulousness at times with truncal rocking (mild) and occasional possibly mild R sided resting tremor. Fine motor use of hands reduced to open/close, RAM and finger tapping, ?R>L. No asterixis or myoclonus.     Assessment:     79 y/o R handed F with hx osteopenia, tobacco use, presents to Movement Disorders clinic for evaluation of tremors mainly in her legs when standing (L>R).     Worsening with more tremor at rest, flattening affect, dexterity changes, which cannot be attributed to cervical spinal stenosis. Given her worsening, will order expanded blood panel including TSH, antibodies, etc, and begin  a trial of propranolol ER 60mg  daily.  Depending on tolerability and response, will then consider a DaTscan or trial of carbidopa/levodopa given potential for early signs of Parkinson's based on exam as above.     Plan:     As above.     RTC in 6 weeks. Patient can follow up sooner if needed. In the meantime, patient will contact the office with any questions or concerns.     -----------------------------------------------------------    Approximately 30 minutes were spent on the day of service including face-to-face time with patient, coordinating care, record review and documentation.    Mattalynn Crandle "Johnston Ebbs, MD  Co-Director  Movement Disorders Specialist  [facility]  [facility]    [address]  [address]  [place] [address]    [place], [place] [address]  T [phone]   F [phone]   T [phone]   F [phone]     [email or web address]

## 2019-09-01 NOTE — Patient Instructions (Signed)
[email or web address]    Our plan:     1) Please complete the blood work as Barrister's clerk.    2) Begin a trial of propranolol ER 60mg  daily.     Keep me updated and we'll follow up in 6 weeks.    [email or web address]    Today's Visit:      In today's visit I reviewed your medications and records relating your health - prior testing, blood work, reports of other health care providers present in your electronic medical record.     If you have records from non-[facility] doctors please send to [place] or bring to next office visit.     A copy of today's visit will be sent to your referring doctor and/or primary care doctor.    Let me know if there are things we could have done better for your office visit.    Patient satisfaction survey:      If you receive a patient satisfaction survey, I would greatly appreciate it if you would complete it.     We are like a car dealer where we aim for a score of 9 or 10.  If you had an experience that was less than a 9 or 10, please let me know so we can improve. If you had a good experience today, please let us know too.  We value your feedback.     Contact me online:      Patient Portal online - Please sign up for MyChart -- this is the best way to communicate me.  There is a messaging feature which can send messages directly to my inbox.  It is the best way to communicate with me and get test results, medication refills or ask questions.     You can expect to get a response within 24-48 hours during weekdays - if you do not, resend your request and inform us we did not respond. My goal is for every question answered as soon as possible. Average response for a phone call is 3 days due to the volume we receive (which is why MyChart is preferred).    MyChart should not be used if you are having a medical emergency -- call 911 in that case.     Coupons for medication:      If you would like to see if samples or vouchers available for your medicines please consider  checking online.  Most medicines have web sites where you can print coupons or vouchers for you co-pay.     For example: AutomobileBuzz.is,  DSLRemote.se, Namendaxr.com, http://www.walker-hill.info/, [email or web address], Vimpat.[email or web address]    Thank you for trusting me with your health.      Take care,      Dedee Liss "Johnston Ebbs, MD  Co-Director, Movement Disorders Specialist  [facility]  [facility]    [email or web address]      [address]  [address]  [place] [address]    [place], [place] [address]  T [phone]   F [phone]   T [phone]   F [phone]

## 2019-09-02 ENCOUNTER — Encounter (INDEPENDENT_AMBULATORY_CARE_PROVIDER_SITE_OTHER): Payer: Self-pay | Admitting: Neurology

## 2019-09-08 ENCOUNTER — Other Ambulatory Visit (INDEPENDENT_AMBULATORY_CARE_PROVIDER_SITE_OTHER): Payer: Self-pay

## 2019-09-08 ENCOUNTER — Telehealth (INDEPENDENT_AMBULATORY_CARE_PROVIDER_SITE_OTHER): Payer: Self-pay

## 2019-09-08 ENCOUNTER — Other Ambulatory Visit (INDEPENDENT_AMBULATORY_CARE_PROVIDER_SITE_OTHER): Payer: Self-pay | Admitting: Internal Medicine

## 2019-09-08 NOTE — Addendum Note (Signed)
Addended by: BRIM, MAHOGANIE M. on: 09/08/2019 11:41 AM     Modules accepted: Orders

## 2019-09-08 NOTE — Addendum Note (Signed)
Addended by: Rich Reining. on: 09/08/2019 11:41 AM     Modules accepted: Orders

## 2019-09-09 ENCOUNTER — Encounter (INDEPENDENT_AMBULATORY_CARE_PROVIDER_SITE_OTHER): Payer: Self-pay | Admitting: Neurology

## 2019-09-12 ENCOUNTER — Other Ambulatory Visit (INDEPENDENT_AMBULATORY_CARE_PROVIDER_SITE_OTHER): Payer: Self-pay | Admitting: Family

## 2019-09-12 ENCOUNTER — Other Ambulatory Visit (FREE_STANDING_LABORATORY_FACILITY): Payer: Medicare Other

## 2019-09-12 ENCOUNTER — Encounter (INDEPENDENT_AMBULATORY_CARE_PROVIDER_SITE_OTHER): Payer: Self-pay

## 2019-09-12 ENCOUNTER — Telehealth (INDEPENDENT_AMBULATORY_CARE_PROVIDER_SITE_OTHER): Payer: Self-pay | Admitting: Internal Medicine

## 2019-09-12 DIAGNOSIS — Z1231 Encounter for screening mammogram for malignant neoplasm of breast: Secondary | ICD-10-CM

## 2019-09-12 DIAGNOSIS — G959 Disease of spinal cord, unspecified: Secondary | ICD-10-CM

## 2019-09-12 DIAGNOSIS — R251 Tremor, unspecified: Secondary | ICD-10-CM

## 2019-09-12 LAB — VITAMIN D,25 OH,TOTAL: Vitamin D, 25 OH, Total: 52 ng/mL (ref 30–100)

## 2019-09-12 LAB — HEMOLYSIS INDEX: Hemolysis Index: 9 (ref 0–18)

## 2019-09-12 NOTE — Telephone Encounter (Signed)
I can order it tomorrow bc it has to be exactly one year from last mammogram

## 2019-09-12 NOTE — Telephone Encounter (Signed)
Could you please put in an order for Mammogram for patient. She was just seen by you in January.

## 2019-09-14 LAB — IMMUNOFIXATION ELECTROPHORESIS

## 2019-09-14 LAB — IFE REVIEW, SERUM

## 2019-09-15 LAB — VITAMIN B1, PLASMA: Vitamin B1 (Thiamine): 11 nmol/L (ref 8–30)

## 2019-09-15 LAB — CELIAC DISEASE COMP PANEL: IGA for Celiac: 264 mg/dL (ref 61–356)

## 2019-09-15 LAB — TISSUE TRANSGLUTAMINASE, IGA: Tissue Transglutaminase Antibody, IgA: <1.2

## 2019-10-08 ENCOUNTER — Encounter (INDEPENDENT_AMBULATORY_CARE_PROVIDER_SITE_OTHER): Payer: Self-pay

## 2019-10-12 ENCOUNTER — Encounter (INDEPENDENT_AMBULATORY_CARE_PROVIDER_SITE_OTHER): Payer: Self-pay | Admitting: Neurology

## 2019-10-13 ENCOUNTER — Ambulatory Visit (INDEPENDENT_AMBULATORY_CARE_PROVIDER_SITE_OTHER): Payer: Medicare Other | Admitting: Neurology

## 2019-10-13 ENCOUNTER — Encounter (INDEPENDENT_AMBULATORY_CARE_PROVIDER_SITE_OTHER): Payer: Self-pay | Admitting: Neurology

## 2019-10-13 VITALS — BP 109/71 | HR 69 | Temp 98.1°F | Resp 14 | Ht 61.0 in | Wt 110.0 lb

## 2019-10-13 DIAGNOSIS — R251 Tremor, unspecified: Secondary | ICD-10-CM

## 2019-10-13 DIAGNOSIS — R269 Unspecified abnormalities of gait and mobility: Secondary | ICD-10-CM

## 2019-10-13 DIAGNOSIS — G959 Disease of spinal cord, unspecified: Secondary | ICD-10-CM

## 2019-10-13 MED ORDER — PROPRANOLOL HCL ER 80 MG PO CP24
80.00 mg | ORAL_CAPSULE | Freq: Every day | ORAL | 3 refills | Status: DC
Start: 2019-10-13 — End: 2020-06-01

## 2019-10-13 NOTE — Patient Instructions (Signed)
[email or web address]    Our plan:     1) Change propranolol ER to 80mg  daily.    2) Schedule and complete a DaTscan to give confirmation if this is a type of Parkinson's Disease.  I'll send an order to them directly, and they will call you to schedule:    [facility]  [address], [place], [place] [address]  Tele: [phone]    We'll meet back in 6-8 weeks in person, but message me through MyChart a few days after the DaTscan.    Today's Visit:      In today's visit I reviewed your medications and records relating your health - prior testing, blood work, reports of other health care providers present in your electronic medical record.     If you have records from non-[facility] doctors please send to [place] or bring to next office visit.     A copy of today's visit will be sent to your referring doctor and/or primary care doctor.    Let me know if there are things we could have done better for your office visit.    Patient satisfaction survey:      If you receive a patient satisfaction survey, I would greatly appreciate it if you would complete it.     We are like a car dealer where we aim for a score of 9 or 10.  If you had an experience that was less than a 9 or 10, please let me know so we can improve. If you had a good experience today, please let us know too.  We value your feedback.     Contact me online:      Patient Portal online - Please sign up for MyChart -- this is the best way to communicate me.  There is a messaging feature which can send messages directly to my inbox.  It is the best way to communicate with me and get test results, medication refills or ask questions.     You can expect to get a response within 24-48 hours during weekdays - if you do not, resend your request and inform us we did not respond. My goal is for every question answered as soon as possible. Average response for a phone call is 3 days due to the volume we receive (which is why  MyChart is preferred).    MyChart should not be used if you are having a medical emergency -- call 911 in that case.     Coupons for medication:      If you would like to see if samples or vouchers available for your medicines please consider checking online.  Most medicines have web sites where you can print coupons or vouchers for you co-pay.     For example: AutomobileBuzz.is,  DSLRemote.se, Namendaxr.com, http://www.walker-hill.info/, Rytary.[email or web address], Vimpat.com    Thank you for trusting me with your health.      Take care,      Taffy Delconte "Johnston Ebbs, MD  Co-Director, Movement Disorders Specialist  [facility]  [facility]    [email or web address]      [address]  [address]  [place] [address]    [place], [place] [address]  T [phone]   F [phone]   T [phone]   F [phone]

## 2019-10-13 NOTE — Progress Notes (Signed)
Subjective:      79 y/o R handed F with hx osteopenia, tobacco use, presents to Movement Disorders clinic for f/u of tremors mainly in her legs when standing (L>R).    Continues with episodic whole body shaking, but episodic. Comes out with walking and sitting, affecting her gait and comfort.  Started propranolol ER at 60mg  daily which helped some. No s/e reported.     She feels when she is building over the kitchen, it makes her neck tight and makes her tremors worse. Also feels like her head pressure builds up in her neck which can be uncomfortable, but not always associated with tremor. Has had a knee replacement since last visit, but her knee 'bothers me'.  Saw neurosurgery who did not recommend surgery for cervical spinal stenosis.     History retained from initial encounter:     She noticed the tremors around 3 years ago.  Started 'slow' but are getting worse.  She would feel she was nervous and her head was shaking.  Now, she can't stand or move without her whole body shaking vigorously, mainly in the legs. Makes her balance off. Legs start shaking when she stands up. When she is lying in bed, she can feel her body shaking. It shakes utensils, has to be careful with soup. She has felt her handwriting has changed, it is shaking. She also feels muscle cramps in her hands and legs. No reduction in dexterity.     Does not typically happen when sitting and improves when walking around.  However, if she is standing still, especially if she puts pressure on her legs, her left leg will start to shake in particular and sometimes it will lead to her whole body shaking.  She feels that it is much worse in her left leg as compared to her right leg.  She denies any specific numbness. She denies any specific weakness.  No falls and feels like she is walking okay.  The shaking again seems to mainly be when she is standing still.  She has a long history of back pain with burning and tingling down into both legs.  She  has no shooting pain down the legs.  No specific neck pain, but does feel a bit stiff.  No shooting pain down the arms.  No saddle anesthesia.  No loss of bowel or bladder control.  No specific rigidity in arms or legs.  Does not get dizzy when standing.  No specific change in gait.  No changes in face.    No change to sense of smell, no constipation.  Sleep is good, not fragmented. She does feel burning and tingles in her legs; both legs. Can be painful. + fatigue. No falls. Had the flu last week, which made her especially weak. + occasional LH/D on standing.     No changes to medication or illnesses 3 yrs ago. Unclear pulmonary history, has not used inhalers in years.  Used it once last week d/t influenza A.     + heavy smoker, no EtOH use. + sister with tremors, who is younger than her. No other family hx noted.     Results/Imaging:     MRI Brain reviewed, 10/23/16:    FINDINGS:   There is minimal diffuse atrophy with small vessels ischemic changes.   The ventricular system is normal in size and contour.   There is no acute intracranial hemorrhage. There is no herniation.   There are no extra-axial fluid collections.  There are no concerning areas of abnormal signal intensity.   The diffusion sequences show no restriction to suggest an acute infarct.    The cerebellar tonsils are normally positioned.  There is mild mucosal thickening involving the bilateral maxillary  sinuses. The mastoid air cells are clear.  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    IMPRESSION:     No acute intracranial abnormalities. There is diffuse  atrophy with small vessels changes.    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 Please see above  for further details.        The following portions of the patient's history were  reviewed and updated as appropriate: allergies, past family history, past medical history, past social history, past surgical history and problem list.    Review of Systems  + leg tremors  No recent illness. Denies fever, chills, cough, sinus pain, eye pain, eye redness, ear pain, rhinorrhea, sore throat, chest pain, SOB, wheezing, abd pain, Nausea, Vomiting, diarrhea, constipation, dysuria, or rashes.  All other systems reviewed and are negative except as previously noted in the HPI.    Objective:     Constitutional: NAD   Eyes: Clear conjunctiva  Musculoskeletal: Normal posture and muscle bulk.  Integumentary: No abnormal rash noted  Psych: Flat affect    Neurological:   Language: Spontaneous & fluent speech, good comprehension.    Cranial Nerves:  III, IV, VI: EOMI, No nystagmus, no palsies, no ptosis  V: Intact to LT V1-V3 distribution bilaterally.   VI: Symmetrical face and expression.   VIII: Hearing intact to finger rub bilaterally.   IX, X: Palate/Uvula elevates symmetrically.   XI: 5/5 Trapezius & SCM bilaterally.   XII: Tongue is midline.     Motor Exam: Normal bulk and tone. No clonus. No pronator drift. Strength 5/5 throughout and symmetric. ? weakness in grip strength, intrinsic muscle atrophy in hands. Weakness L leg in hip and knee.    Sensation: Sensation to LT intact grossly through symmetrically. Romberg positive.     Cerebellar:   Finger to Nose: intact bilaterally, with slight terminal dysmetria bilaterally.     Reflexes: 2+ throughout    Station/ Gait: Wider based, ataxic component, looking down for balance. Tremulous throughout with gait, ? R sided tremor.     Full body tremulousness at times with truncal rocking (mild) and occasional possibly mild R sided resting tremor. Fine motor use of hands reduced to open/close, RAM and finger tapping, ?R>L. No asterixis or myoclonus.     Assessment:     79 y/o R handed F with hx osteopenia, tobacco use, presents to Movement Disorders clinic for  evaluation of tremors mainly in her legs when standing (L>R).     Blood work WNL, which is reassuring, but symptoms persists, only showing a bit of improvement with propranolol ER at 60mg  daily. Given the worsening whole body tremors at rest, flattening affect, dexterity changes, which cannot be attributed to cervical spinal stenosis, there is concern for Parkinson's or some parkinsonism.  Given this concern and her reticence for a medication trial, will order a DaTscan. This will allow [place] to quantify the density of dopamine receptors, giving objective data as to the state of the dopamine circuit in the brain.  If positive, it would help confirm a diagnosis and  directly impact other diagnostic and clinical treatment decisions.While the DaT is being completed, will increase propranolol ER 80mg  daily.     Plan:     As above.     RTC in 6 weeks. Patient can follow up sooner if needed. In the meantime, patient will contact the office with any questions or concerns.     -----------------------------------------------------------    Approximately 40 minutes were spent on the day of service including face-to-face time with patient, coordinating care, record review and documentation.      Avry Monteleone "Johnston Ebbs, MD  Co-Director, Movement Disorders Specialist  [facility]  [facility]    [address] [place]  [address]  [place] [address]    [place], [place] [address]  T [phone]   F [phone]   T [phone]   F [phone]     [email or web address]

## 2019-10-14 ENCOUNTER — Other Ambulatory Visit (INDEPENDENT_AMBULATORY_CARE_PROVIDER_SITE_OTHER): Payer: Self-pay | Admitting: Internal Medicine

## 2019-10-14 DIAGNOSIS — Z1231 Encounter for screening mammogram for malignant neoplasm of breast: Secondary | ICD-10-CM

## 2019-10-25 ENCOUNTER — Encounter (INDEPENDENT_AMBULATORY_CARE_PROVIDER_SITE_OTHER): Payer: Self-pay

## 2019-10-25 ENCOUNTER — Ambulatory Visit
Admission: RE | Admit: 2019-10-25 | Discharge: 2019-10-25 | Disposition: A | Discharge status: Home / Self Care | Payer: Medicare Other | Source: Ambulatory Visit | Attending: Internal Medicine | Admitting: Internal Medicine

## 2019-10-25 DIAGNOSIS — Z1231 Encounter for screening mammogram for malignant neoplasm of breast: Secondary | ICD-10-CM

## 2019-12-08 ENCOUNTER — Ambulatory Visit (INDEPENDENT_AMBULATORY_CARE_PROVIDER_SITE_OTHER): Payer: Medicare Other | Admitting: Neurology

## 2019-12-08 ENCOUNTER — Encounter (INDEPENDENT_AMBULATORY_CARE_PROVIDER_SITE_OTHER): Payer: Self-pay | Admitting: Neurology

## 2019-12-08 VITALS — BP 111/59 | HR 70 | Ht 60.0 in | Wt 112.0 lb

## 2019-12-08 DIAGNOSIS — G959 Disease of spinal cord, unspecified: Secondary | ICD-10-CM

## 2019-12-08 DIAGNOSIS — G252 Other specified forms of tremor: Secondary | ICD-10-CM

## 2019-12-08 DIAGNOSIS — R269 Unspecified abnormalities of gait and mobility: Secondary | ICD-10-CM

## 2019-12-08 DIAGNOSIS — R251 Tremor, unspecified: Secondary | ICD-10-CM

## 2019-12-08 MED ORDER — CLONAZEPAM 0.25 MG PO TBDP
0.25 mg | ORAL_TABLET | Freq: Every day | ORAL | 3 refills | Status: DC
Start: 2019-12-08 — End: 2021-06-03

## 2019-12-08 NOTE — Progress Notes (Signed)
Subjective:      79 y/o R handed F with hx osteopenia, tobacco use, presents to Movement Disorders clinic for f/u of tremors mainly in her legs when standing (L>R).    Tremor is worsening, but she feels it is 'my whole body.' Has back, knee, joint issues which is contributing. Taking propranolol ER 80mg  every morning, but still shaking. Feels very tired from her shaking and back/shoulder pain.     Tremor comes out most when walking, especially when standing in her legs. Standing bothers her greatly. When she walks, it calms down.  Doesn't fall but makes her worry she could. No LH/D reported.     Results/Imaging:     DatScan (10/27/19):        History retained from initial encounter:     She noticed the tremors around 3 years ago.  Started 'slow' but are getting worse.  She would feel she was nervous and her head was shaking.  Now, she can't stand or move without her whole body shaking vigorously, mainly in the legs. Makes her balance off. Legs start shaking when she stands up. When she is lying in bed, she can feel her body shaking. It shakes utensils, has to be careful with soup. She has felt her handwriting has changed, it is shaking. She also feels muscle cramps in her hands and legs. No reduction in dexterity.     Does not typically happen when sitting and improves when walking around.  However, if she is standing still, especially if she puts pressure on her legs, her left leg will start to shake in particular and sometimes it will lead to her whole body shaking.  She feels that it is much worse in her left leg as compared to her right leg.  She denies any specific numbness. She denies any specific weakness.  No falls and feels like she is walking okay.  The shaking again seems to mainly be when she is standing still.  She has a long history of back pain with burning and tingling down into both legs.  She has no shooting pain down the legs.  No specific neck pain, but does feel a bit stiff.  No shooting  pain down the arms.  No saddle anesthesia.  No loss of bowel or bladder control.  No specific rigidity in arms or legs.  Does not get dizzy when standing.  No specific change in gait.  No changes in face.    No change to sense of smell, no constipation.  Sleep is good, not fragmented. She does feel burning and tingles in her legs; both legs. Can be painful. + fatigue. No falls. Had the flu last week, which made her especially weak. + occasional LH/D on standing.     No changes to medication or illnesses 3 yrs ago. Unclear pulmonary history, has not used inhalers in years.  Used it once last week d/t influenza A.     + heavy smoker, no EtOH use. + sister with tremors, who is younger than her. No other family hx noted.     Results/Imaging:     MRI Brain reviewed, 10/23/16:    FINDINGS:   There is minimal diffuse atrophy with small vessels ischemic changes.   The ventricular system is normal in size and contour.   There is no acute intracranial hemorrhage. There is no herniation.   There are no extra-axial fluid collections.   There are no concerning areas of abnormal signal intensity.   The  diffusion sequences show no restriction to suggest an acute infarct.    The cerebellar tonsils are normally positioned.  There is mild mucosal thickening involving the bilateral maxillary  sinuses. The mastoid air cells are clear.  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    IMPRESSION:     No acute intracranial abnormalities. There is diffuse  atrophy with small vessels changes.    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 Please see above  for further details.        The following portions of the patient's history were reviewed and updated as appropriate: allergies, past family history, past medical history, past  social history, past surgical history and problem list.    Review of Systems  + leg tremors  No recent illness. Denies fever, chills, cough, sinus pain, eye pain, eye redness, ear pain, rhinorrhea, sore throat, chest pain, SOB, wheezing, abd pain, Nausea, Vomiting, diarrhea, constipation, dysuria, or rashes.  All other systems reviewed and are negative except as previously noted in the HPI.    Objective:     Constitutional: NAD   Eyes: Clear conjunctiva  Musculoskeletal: Normal posture and muscle bulk.  Integumentary: No abnormal rash noted  Psych: Flat affect    Neurological:   Language: Spontaneous & fluent speech, good comprehension.    Cranial Nerves:  III, IV, VI: EOMI, No nystagmus, no palsies, no ptosis  V: Intact to LT V1-V3 distribution bilaterally.   VI: Symmetrical face and expression.   VIII: Hearing intact to finger rub bilaterally.   IX, X: Palate/Uvula elevates symmetrically.   XI: 5/5 Trapezius & SCM bilaterally.   XII: Tongue is midline.     Motor Exam: Normal bulk and tone. No clonus. No pronator drift. Strength 5/5 throughout and symmetric. ? weakness in grip strength, intrinsic muscle atrophy in hands. Weakness L leg in hip and knee.    Sensation: Sensation to LT intact grossly through symmetrically. Romberg positive.     Cerebellar:   Finger to Nose: intact bilaterally, with slight terminal dysmetria bilaterally.     Reflexes: 1+ throughout    Station/ Gait: Wider based, ataxic component, looking down for balance. Tremulous throughout with gait, legs especially, worsens with standing, improved by walking.   Assessment:     79 y/o R handed F with hx osteopenia, tobacco use, presents to Movement Disorders clinic for evaluation of tremors mainly in her legs when standing (L>R).     DaTscan now negative for PD, remainder of workup negative outside of cervical spinal stenosis, which can explain the intrinsic hand muscle atrophy. Her presentation now strongly suggests orthostatic tremor, with a  component of tremor while seated as well.  Discussed diagnosis and implications at length, as well as limited treatment options available. Will continue propranolol ER 80mg  daily and give a trial of clonazepam 0.25mg  QAM.  Depending on response, will send for EMG/NCS to confirm diagnosis.     Plan:     As above.     RTC in 2 mo. Patient can follow up sooner if needed. In the meantime, patient will contact the office with any questions or concerns.     -----------------------------------------------------------    Approximately 40 minutes were spent on the day of service including face-to-face time with patient, coordinating care, record review and documentation.  Justiss Gerbino "Orie Rout, MD  Director, Movement Disorders Specialist  [facility]    [address] [place]  [address]  [place] [address]    [place], [place] [address]  T [phone]   F [phone]   T [phone]   F [phone]     [email or web address]

## 2019-12-08 NOTE — Patient Instructions (Addendum)
[email or web address]    Our plan:     1) Continue propranolol ER 80mg  daily.    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    3) Begin physical therapy for gait/balance.     Keep me updated via MyChart and return to clinic in August.     Today's Visit:      In today's visit I reviewed your medications and records relating your health - prior testing, blood work, reports of other health care providers present in your electronic medical record.     If you have records from non-[facility] doctors please send to [place] or bring to next office visit.     A copy of today's visit will be sent to your referring doctor and/or primary care doctor.    Let me know if there are things we could have done better for your office visit.    Patient satisfaction survey:      If you receive a patient satisfaction survey, I would greatly appreciate it if you would complete it.     We are like a car dealer where we aim for a score of 9 or 10.  If you had an experience that was less than a 9 or 10, please let me know so we can improve. If you had a good experience today, please let us know too.  We value your feedback.     Contact me online:      Patient Portal online - Please sign up for MyChart -- this is the best way to communicate me.  There is a messaging feature which can send messages directly to my inbox.  It is the best way to communicate with me and get test results, medication refills or ask questions.     You can expect to get a response within 24-48 hours during weekdays - if you do not, resend your request and inform us we did not respond. My goal is for every question answered as soon as possible. Average response for a phone call is 3 days due to the volume we receive (which is why MyChart is preferred).    MyChart should not be used if you are having a medical emergency -- call 911 in that case.     Coupons for medication:      If you would like to see if samples or vouchers  available for your medicines please consider checking online.  Most medicines have web sites where you can print coupons or vouchers for you co-pay.     For example: AutomobileBuzz.is,  DSLRemote.se, Namendaxr.com, http://www.walker-hill.info/, Rytary.[email or web address], Vimpat.com    Thank you for trusting me with your health.      Take care,      Marca Gadsby "Johnston Ebbs, MD  Director, Movement Disorders Specialist  [facility]  [facility]    [email or web address]      [address]  [address]  [place] [address]    [place], [place] [address]  T [phone]   F [phone]   T [phone]   F [phone]

## 2019-12-14 ENCOUNTER — Encounter (INDEPENDENT_AMBULATORY_CARE_PROVIDER_SITE_OTHER): Payer: Self-pay

## 2020-01-03 ENCOUNTER — Ambulatory Visit (INDEPENDENT_AMBULATORY_CARE_PROVIDER_SITE_OTHER): Payer: Medicare Other | Admitting: Family

## 2020-01-03 ENCOUNTER — Encounter (INDEPENDENT_AMBULATORY_CARE_PROVIDER_SITE_OTHER): Payer: Self-pay | Admitting: Family

## 2020-01-03 ENCOUNTER — Ambulatory Visit (HOSPITAL_BASED_OUTPATIENT_CLINIC_OR_DEPARTMENT_OTHER)
Admission: RE | Admit: 2020-01-03 | Discharge: 2020-01-03 | Disposition: A | Discharge status: Home / Self Care | Payer: Medicare Other | Source: Ambulatory Visit | Attending: Family | Admitting: Family

## 2020-01-03 ENCOUNTER — Ambulatory Visit
Admission: RE | Admit: 2020-01-03 | Discharge: 2020-01-03 | Disposition: A | Discharge status: Home / Self Care | Payer: Medicare Other | Source: Ambulatory Visit | Attending: Family | Admitting: Family

## 2020-01-03 VITALS — BP 108/63 | HR 75 | Temp 99.2°F | Resp 12 | Ht 58.39 in | Wt 112.0 lb

## 2020-01-03 DIAGNOSIS — M25511 Pain in right shoulder: Secondary | ICD-10-CM

## 2020-01-03 DIAGNOSIS — G8929 Other chronic pain: Secondary | ICD-10-CM | POA: Insufficient documentation

## 2020-01-03 DIAGNOSIS — R0781 Pleurodynia: Secondary | ICD-10-CM

## 2020-01-03 NOTE — Progress Notes (Signed)
Have you seen any specialists/other providers since your last visit with [place]?    No    Arm preference verified?   Yes    The patient is due for spirometry and AD, COVID-19 Vaccine, AWV

## 2020-01-03 NOTE — Progress Notes (Signed)
Subjective:      Patient ID: Michele [name] is a 79 y.o. female     Chief Complaint   Patient presents with    Shoulder Pain      Chronic right     Right rib pain     under right breast feels sharp pain        Shoulder Pain   The pain is present in the right shoulder. This is a chronic problem. The problem occurs constantly. The pain is moderate. Associated symptoms include a limited range of motion. Pertinent negatives include no fever or numbness. She has tried NSAIDS for the symptoms. The treatment provided mild relief. Her past medical history is significant for osteoarthritis.   Additionally patient is complaining of bilateral rib cage pain which feels like a pinching, denies shortness of breath.  Currently smokes 6 to 8 cigarettes a day.    The following sections were reviewed this encounter by the provider:   Tobacco   Allergies   Meds   Problems   Med Hx   Surg Hx   Fam Hx          Review of Systems   Constitutional: Negative for chills, fatigue and fever.   HENT: Negative for congestion.    Respiratory: Negative for cough, chest tightness, shortness of breath and wheezing.    Cardiovascular: Negative for chest pain, palpitations and leg swelling.   Gastrointestinal: Negative for diarrhea, nausea and vomiting.   Musculoskeletal: Negative for arthralgias.   Neurological: Negative for dizziness, seizures, speech difficulty, numbness and headaches.          BP 108/63 (BP Site: Right arm, Patient Position: Sitting, Cuff Size: Small)    Pulse 75    Temp 99.2 F (37.3 C) (Tympanic)    Resp 12    Ht 1.483 m (4' 10.39")    Wt 50.8 kg (111 lb 15.9 oz)    SpO2 96%    BMI 23.10 kg/m     Objective:     Physical Exam  Vitals and nursing note reviewed.   Constitutional:       Appearance: Normal appearance. She is well-developed. She is not ill-appearing.   HENT:      Head: Normocephalic and atraumatic.   Cardiovascular:      Rate and Rhythm: Normal rate and regular rhythm.      Heart sounds: Normal heart sounds.    Pulmonary:      Effort: Pulmonary effort is normal.      Breath sounds: Normal breath sounds.   Chest:      Chest wall: Tenderness present. No mass.       Musculoskeletal:      Right shoulder: Tenderness present. Decreased range of motion.      Left shoulder: No tenderness. Normal range of motion.   Skin:     General: Skin is warm and dry.   Neurological:      Mental Status: She is alert and oriented to person, place, and time.   Psychiatric:         Behavior: Behavior normal.         Thought Content: Thought content normal.         Judgment: Judgment normal.          Assessment:     1. Rib pain on right side  - XR Ribs Right W PA Chest; Future    2. Chronic right shoulder pain  - XR Shoulder Right 2+ Views; Future  Plan:   1.  Acute stable,  Rule out any rib fractures, lung related problems  X-ray pending  2.  Chronic stable  [facility] will see [facility] July 1.  Possible relation to cervical spine problems  Will refer to PT if pain persists    Medication list reviewed with patient and updated as indicated.  Risk & Benefits of any new medication(s) were explained to the patient who verbalized understanding & agreed to the treatment plan.  All questions are answered.Patient given a printed copy of AVS. Patient verbalized understanding of instructions given.      Jveon Pound Berna Spare, NP

## 2020-01-05 ENCOUNTER — Other Ambulatory Visit: Payer: Self-pay | Admitting: Physical Medicine & Rehabilitation

## 2020-01-05 ENCOUNTER — Ambulatory Visit
Admission: RE | Admit: 2020-01-05 | Discharge: 2020-01-05 | Disposition: A | Discharge status: Home / Self Care | Payer: Medicare Other | Source: Ambulatory Visit | Attending: Physical Medicine & Rehabilitation | Admitting: Physical Medicine & Rehabilitation

## 2020-01-05 DIAGNOSIS — G8929 Other chronic pain: Secondary | ICD-10-CM

## 2020-01-05 DIAGNOSIS — M546 Pain in thoracic spine: Secondary | ICD-10-CM

## 2020-01-05 DIAGNOSIS — M545 Low back pain, unspecified: Secondary | ICD-10-CM

## 2020-01-05 DIAGNOSIS — M47894 Other spondylosis, thoracic region: Secondary | ICD-10-CM

## 2020-01-05 DIAGNOSIS — M519 Unspecified thoracic, thoracolumbar and lumbosacral intervertebral disc disorder: Secondary | ICD-10-CM

## 2020-01-05 LAB — IGG, IGA, IGM
Immunoglobulin A: 297 mg/dL (ref 69–517)
Immunoglobulin G: 926 mg/dL (ref 540–1822)
Immunoglobulin M: 108 mg/dL (ref 22–293)

## 2020-01-05 LAB — C-REACTIVE PROTEIN: C-Reactive Protein: <0.1 mg/dL (ref 0.0–0.8)

## 2020-01-05 LAB — HEMOLYSIS INDEX: Hemolysis Index: 6 (ref 0–18)

## 2020-01-05 LAB — SEDIMENTATION RATE: Sed Rate: 8 mm/Hr (ref 0–20)

## 2020-01-05 LAB — RHEUMATOID FACTOR: Rheumatoid Factor: <15 (ref 0.0–30.0)

## 2020-01-05 LAB — URIC ACID: Uric acid: 2.9 mg/dL (ref 2.6–6.0)

## 2020-01-06 LAB — HLA-B27 ANTIGEN: HLA B27: NEGATIVE

## 2020-01-06 LAB — PROTEIN ELECTROPHORESIS, SERUM
Albumin %: 53.8 % (ref 46.6–62.6)
Albumin: 3.6 g/dL (ref 3.4–4.8)
Alpha-1 Glob %: 3.7 % (ref 1.7–4.1)
Alpha-1 Globulin: 0.2 g/dL (ref 0.1–0.4)
Alpha-2 Glob %: 14.8 % (ref 8.9–14.9)
Alpha-2 Globulin: 1 g/dL (ref 0.8–1.2)
Beta Glob %: 13.3 % (ref 10.9–18.9)
Beta Globulin: 0.9 g/dL (ref 0.6–1.2)
Gamma Globulin %: 14.3 % (ref 9.8–24.4)
Gamma Globulin: 1 g/dL (ref 0.6–1.7)
Protein, Total: 6.7 g/dL (ref 6.0–8.3)

## 2020-01-06 LAB — ELECTROPHORESIS REVIEW, URINE

## 2020-01-06 LAB — PROTEIN ELECTROPHORESIS, URINE
Alpha 1, Urine: 12.8 %
Alpha 2, Urine: 17.9 %
Beta, Urine: 15.3 %
Protein, UR: <7 mg/dL (ref 1.0–14.0)
Urine Albumin%: 38.7 %
Urine Gamma %: 15.4 %
Urine Pre Albumin%: 0 %

## 2020-01-06 LAB — ANA SCREEN, IFA, WITH REFLEX TO TITER AND PATTERN
ANA Screen: POSITIVE — AB
ANA Titer: 1:320 {titer} — AB

## 2020-01-06 LAB — SERUM PROTEIN ELECTROPHORESIS REVIEW

## 2020-01-06 LAB — URINE IMMUNOFIXATION

## 2020-01-06 LAB — PATHOLOGY REVIEW-ANA

## 2020-01-10 LAB — IFE REVIEW, URINE

## 2020-01-11 LAB — IFE REVIEW, SERUM

## 2020-01-11 LAB — IMMUNOFIXATION ELECTROPHORESIS

## 2020-01-19 ENCOUNTER — Ambulatory Visit
Admission: RE | Admit: 2020-01-19 | Discharge: 2020-01-19 | Disposition: A | Discharge status: Home / Self Care | Payer: Medicare Other | Source: Ambulatory Visit | Attending: Physical Medicine & Rehabilitation | Admitting: Physical Medicine & Rehabilitation

## 2020-01-19 DIAGNOSIS — M4314 Spondylolisthesis, thoracic region: Secondary | ICD-10-CM | POA: Insufficient documentation

## 2020-01-19 DIAGNOSIS — M519 Unspecified thoracic, thoracolumbar and lumbosacral intervertebral disc disorder: Secondary | ICD-10-CM | POA: Insufficient documentation

## 2020-01-19 DIAGNOSIS — M546 Pain in thoracic spine: Secondary | ICD-10-CM | POA: Insufficient documentation

## 2020-01-19 DIAGNOSIS — M5124 Other intervertebral disc displacement, thoracic region: Secondary | ICD-10-CM | POA: Insufficient documentation

## 2020-01-19 DIAGNOSIS — M4804 Spinal stenosis, thoracic region: Secondary | ICD-10-CM | POA: Insufficient documentation

## 2020-01-19 DIAGNOSIS — M47894 Other spondylosis, thoracic region: Secondary | ICD-10-CM

## 2020-01-19 DIAGNOSIS — G9589 Other specified diseases of spinal cord: Secondary | ICD-10-CM | POA: Insufficient documentation

## 2020-01-19 DIAGNOSIS — M5134 Other intervertebral disc degeneration, thoracic region: Secondary | ICD-10-CM | POA: Insufficient documentation

## 2020-01-19 DIAGNOSIS — M488X4 Other specified spondylopathies, thoracic region: Secondary | ICD-10-CM | POA: Insufficient documentation

## 2020-01-25 ENCOUNTER — Ambulatory Visit: Payer: Medicare Other

## 2020-02-16 ENCOUNTER — Encounter (INDEPENDENT_AMBULATORY_CARE_PROVIDER_SITE_OTHER): Payer: Self-pay | Admitting: Family

## 2020-02-22 ENCOUNTER — Other Ambulatory Visit (INDEPENDENT_AMBULATORY_CARE_PROVIDER_SITE_OTHER): Payer: Self-pay | Admitting: Neurology

## 2020-02-22 ENCOUNTER — Encounter (INDEPENDENT_AMBULATORY_CARE_PROVIDER_SITE_OTHER): Payer: Self-pay | Admitting: Neurology

## 2020-02-22 DIAGNOSIS — R269 Unspecified abnormalities of gait and mobility: Secondary | ICD-10-CM

## 2020-03-01 ENCOUNTER — Telehealth (INDEPENDENT_AMBULATORY_CARE_PROVIDER_SITE_OTHER): Payer: Self-pay | Admitting: Internal Medicine

## 2020-03-01 NOTE — Telephone Encounter (Signed)
Pt would like to discuss recent lab results with a nurse, please advise, [phone]

## 2020-03-02 ENCOUNTER — Emergency Department
Admission: EM | Admit: 2020-03-02 | Discharge: 2020-03-02 | Disposition: A | Discharge status: Home / Self Care | Payer: Medicare Other | Attending: Emergency Medicine | Admitting: Emergency Medicine

## 2020-03-02 DIAGNOSIS — Z7982 Long term (current) use of aspirin: Secondary | ICD-10-CM | POA: Insufficient documentation

## 2020-03-02 DIAGNOSIS — E7800 Pure hypercholesterolemia, unspecified: Secondary | ICD-10-CM | POA: Insufficient documentation

## 2020-03-02 DIAGNOSIS — M5414 Radiculopathy, thoracic region: Secondary | ICD-10-CM | POA: Insufficient documentation

## 2020-03-02 DIAGNOSIS — G629 Polyneuropathy, unspecified: Secondary | ICD-10-CM | POA: Insufficient documentation

## 2020-03-02 DIAGNOSIS — M62830 Muscle spasm of back: Secondary | ICD-10-CM | POA: Insufficient documentation

## 2020-03-02 DIAGNOSIS — F17210 Nicotine dependence, cigarettes, uncomplicated: Secondary | ICD-10-CM | POA: Insufficient documentation

## 2020-03-02 LAB — URINALYSIS REFLEX TO MICROSCOPIC EXAM - REFLEX TO CULTURE
Bilirubin, UA: NEGATIVE
Blood, UA: NEGATIVE
Glucose, UA: NEGATIVE
Ketones UA: NEGATIVE
Leukocyte Esterase, UA: NEGATIVE
Nitrite, UA: NEGATIVE
Protein, UR: NEGATIVE
Specific Gravity UA: 1.02 (ref 1.001–1.035)
Urine pH: 7.5 (ref 5.0–8.0)
Urobilinogen, UA: 0.2 mg/dL (ref 0.2–2.0)

## 2020-03-02 MED ORDER — CYCLOBENZAPRINE HCL 5 MG PO TABS
ORAL_TABLET | ORAL | 0 refills | Status: DC
Start: 2020-03-02 — End: 2020-03-08

## 2020-03-02 MED ORDER — IBUPROFEN 600 MG PO TABS
600.0000 mg | ORAL_TABLET | Freq: Four times a day (QID) | ORAL | 0 refills | Status: DC | PRN
Start: 2020-03-02 — End: 2022-12-09

## 2020-03-02 NOTE — ED Provider Notes (Signed)
EMERGENCY DEPARTMENT HISTORY AND PHYSICAL EXAM     None        Date: 03/02/2020  Patient Name: Michele [name]    History of Presenting Illness     Chief Complaint   Patient presents with    Back Pain       History Provided By: Patient and Patient's Daughter    Chief Complaint: back pain  Duration: weeks  Timing:  Intermittent  Location: R upper back, now L upper back   Quality: sharp and shooting  Severity: Moderate  Exacerbating factors: worse in some positions   Alleviating factors: none  Associated Symptoms: pain wraps around body  Pertinent Negatives: fever, injury, spinal pain, new weakness/numbness, bowel/bladder symptoms or incontinence.     Additional History: Michele [name] is a 79 y.o. female presenting to the ED with spasms of pain, which was initially located in the R upper back, radiating into the R abdomen x several weeks, but which has now moved to the L lateral back x a few days ago. Pt denies any injury, fall, abdominal pain, n/v/d, fever, weakness. Pt has neuropathy in her legs, but no new weakness/numbness. Able to control her bowel/bladder function without difficulty. Ambulatory. Pt has seen a pain specialist for many years for back pain. She did see him for this R sided pain, which he thought was related to her shoulder, and she was referred to PT. Pt does say she was given some medications for her pain, including a muscle relaxant, but pt has not taken these.     PCP: Jeraldine Loots, MD  SPECIALISTS:    No current facility-administered medications for this encounter.     Current Outpatient Medications   Medication Sig Dispense Refill    atorvastatin (LIPITOR) 10 MG tablet Take 1 tablet (10 mg total) by mouth daily 90 tablet 1    clonazePAM (KlonoPIN) 0.25 MG disintegrating tablet Take 1 tablet (0.25 mg total) by mouth daily 90 tablet 3    aspirin EC 81 MG EC tablet Take by mouth.      Calcium Carbonate (CALCIUM 600 PO) Take 1 tablet by mouth daily.          cyclobenzaprine (FLEXERIL) 5 MG  tablet 1/2 to 1 tablet as needed every 8 hours PRN muscle spasm. Caution: causes sedation 10 tablet 0    ibuprofen (ADVIL) 600 MG tablet Take 1 tablet (600 mg total) by mouth every 6 (six) hours as needed for Pain 30 tablet 0    omeprazole (PriLOSEC) 20 MG capsule TAKE 1 CAPSULE (20 MG TOTAL) BY MOUTH DAILY AS NEEDED (GERD) 90 capsule 0    propranolol (INDERAL LA) 80 MG 24 hr capsule Take 1 capsule (80 mg total) by mouth daily 90 capsule 3    tobramycin-dexamethasone (TOBRADEX) ophthalmic solution INSTILL 1 DROP INTO BOTH EYES TWICE A DAY      VITAMIN E PO Take by mouth daily         Past History     Past Medical History:  Past Medical History:   Diagnosis Date    Abnormal EKG     Arthralgia     Barrett's esophagus     Cervical spinal stenosis     Colonic polyp     Depression     Disc disorder     Gastroesophageal reflux disease     Hypercholesterolemia     Lower back pain     Numbness and tingling of both legs 02/06/2015    Osteoarthritis  Osteopenia        Past Surgical History:  Past Surgical History:   Procedure Laterality Date    APPENDECTOMY  1960?    when was a teenager    KNEE SURGERY Left     FX patella    TUBAL LIGATION  1973       Family History:  Family History   Problem Relation Age of Onset    Breast cancer Neg Hx     Ovarian cancer Neg Hx     Cancer Neg Hx        Social History:  Social History     Tobacco Use    Smoking status: Current Every Day Smoker     Packs/day: 0.25     Years: 50.00     Pack years: 12.50     Types: Cigarettes    Smokeless tobacco: Never Used   Haematologist Use: Never used   Substance Use Topics    Alcohol use: No    Drug use: No       Allergies:  No Known Allergies    Review of Systems     Review of Systems   Constitutional: Negative for fever.   Respiratory: Negative for shortness of breath.    Cardiovascular: Negative for chest pain.   Gastrointestinal: Negative for abdominal pain.   Musculoskeletal: Positive for back pain. Negative for  neck pain.   Skin: Negative for rash.   Neurological: Negative for syncope, weakness and numbness.       Physical Exam   BP 119/60    Pulse 68    Temp 99.1 F (37.3 C) (Oral)    Resp 18    Ht 4\' 10"  (1.473 m)    Wt 49.8 kg    SpO2 95%    BMI 22.93 kg/m     Physical Exam  Vitals and nursing note reviewed.   Constitutional:       General: She is not in acute distress.     Appearance: She is well-developed.   HENT:      Head: Normocephalic and atraumatic.      Nose: Nose normal.      Mouth/Throat:      Mouth: Mucous membranes are moist.      Pharynx: Oropharynx is clear.   Eyes:      Extraocular Movements: Extraocular movements intact.   Cardiovascular:      Rate and Rhythm: Normal rate and regular rhythm.      Heart sounds: Normal heart sounds.   Pulmonary:      Effort: Pulmonary effort is normal.      Breath sounds: Normal breath sounds. No wheezing.   Abdominal:      Palpations: Abdomen is soft.      Tenderness: There is no abdominal tenderness.      Comments: Non-tender abdomen   Musculoskeletal:         General: No tenderness (no bony TTP to spine, chest wall). Normal range of motion.      Cervical back: Normal range of motion and neck supple.      Comments: Pt occasionally jumps with spasms of pain, but there is no reproducible TTP on exam to back, chest wall, or abdomen   Skin:     General: Skin is warm and dry.      Capillary Refill: Capillary refill takes less than 2 seconds.   Neurological:      General: No focal deficit present.  Mental Status: She is alert and oriented to person, place, and time.      Sensory: No sensory deficit.      Motor: No weakness.   Psychiatric:         Mood and Affect: Mood normal.         Behavior: Behavior normal.         Diagnostic Study Results     Labs -     Results     Procedure Component Value Units Date/Time    UA Reflex to Micro - Reflex to Culture [[id number]] Collected: 03/02/20 1047     Updated: 03/02/20 1106     Urine Type Urine, Clean Ca     Color, UA Yellow      Clarity, UA Slightly Cloudy     Specific Gravity UA 1.020     Urine pH 7.5     Leukocyte Esterase, UA Negative     Nitrite, UA Negative     Protein, UR Negative     Glucose, UA Negative     Ketones UA Negative     Urobilinogen, UA 0.2 mg/dL      Bilirubin, UA Negative     Blood, UA Negative     RBC, UA 0-2 /hpf      WBC, UA 0-5 /hpf      Squamous Epithelial Cells, Urine 0-5 /hpf           Radiologic Studies -   Radiology Results (24 Hour)     ** No results found for the last 24 hours. **      .    Medical Decision Making   I am the first provider for this patient.    I reviewed the vital signs, available nursing notes, past medical history, past surgical history, family history and social history.    Vital Signs-Reviewed the patient's vital signs.     No data found.    Pulse Oximetry Analysis - Normal 96% on RA    Old Medical Records: Old medical records.  Nursing notes.     ED Course:   11:26 AM -discussed imaging and blood work options in the ED, but discussed that if pain is due to bulging disks or radiculopathy as I suspect, that these tests would not be helpful.  Patient and daughter agree with holding off on imaging at this time, and agree with plan for pain control at home, with pain and spine specialist follow-up.  Patient states she has seen pain specialist in the past, but not in the last several months, and not for this L sided pain.  Discussed that she may need an MRI to help evaluate the cause of her pain.  She has no red flags on exam today, and is comfortable with this plan.  Discussed sedation precautions when taking a muscle relaxant, and daughter agrees to help her while patient is coming accustomed to any possible side effects.  Discussed cardiovascular effects of long-term ibuprofen use, and recommended using it sparingly.  All questions answered.    Provider Notes:   79 y.o. F with ongoing back spasms, which were located on the R thoracic side, but are now also on the L side. Pt has seen her  pain doctor for the R sided pain, but has not tried the medications she was given. Daughter is in town visiting and wanted to get pt seen for the pain before she left town. Given lack of bony tenderness, I do not suspect fracture or other  acute injury.     Dr. Ander Slade is the primary emergency physician of record.       Diagnosis     Clinical Impression:   1. Thoracic radiculopathy        Treatment Plan:   ED Disposition     ED Disposition Condition Date/Time Comment    Discharge  Fri Mar 02, 2020 11:26 AM Toma Deiters discharge to home/self care.    Condition at disposition: Stable          This note was generated by the Epic EMR system/ Dragon speech recognition and may contain inherent errors or omissions not intended by the user. Grammatical errors, random word insertions, deletions and pronoun errors  are occasional consequences of this technology due to software limitations. Not all errors are caught or corrected. If there are questions or concerns about the content of this note or information contained within the body of this dictation they should be addressed directly with the author for clarification.    _______________________________         Kari Baars, MD  03/04/20 [phone]

## 2020-03-02 NOTE — Discharge Instructions (Signed)
You may take Tylenol, Ibuprofen, use lidocaine patches, AND take the muscle relaxant, Flexeril, as needed for pain. If you feel better after one of these medicines, there is no need to take more, but you could use them all together if you needed to.     Ibuprofen has some potential cardiovascular effects, so do not take it longer than you need to, and use it for the shortest amount of time possible.     Flexeril will make you feel sleepy, which can increase your risk of falling. Please use caution when taking it.     Please follow up with the pain/spine specialists to discuss further imaging and PT/therapy/medications.       Thoracic Radiculopathy    You have been seen for a thoracic radiculopathy.     Your spine has bones called vertebrae. In between the bones there are soft cushions. These are called disks. The disks keep the vertebrae from rubbing against each other. Inside of each disk is a thick, jelly-like substance called the nucleus pulposus. Sometimes when the spine is injured, a disk is damaged and the nucleus pulposus leaks out of the disk. This is called a disk herniation. As people age, the disks get brittle and delicate. If this happens, you might have a disk herniation. This is possible even if you don't remember getting injured.    Sometimes a herniated disk presses on a nerve in the spine. This causes pain near the herniated disk. Since you have symptoms in your middle back the problem is in the thoracic spine. The thoracic spine begins at the base of the neck and goes down to the top of the small of your back. Other problems can cause a radiculopathy (nerve pain), including a narrowed opening where the nerves come out.    Some symptoms of a thoracic radiculopathy are:     Pain that wraps around your chest on one side.   A burning feeling that goes around your chest on one side.   Numbness (pins and needles or loss of feeling) around your chest on one side.    This problem is often  treated with rest, physical therapy, and medication for the swelling and pain. If these treatments don't work, surgery may be needed. Sometimes taking steroids (like Prednisone) for a few days can help the pain.    We dont believe your condition is dangerous right now. However, you need to be careful. Sometimes a problem that seems small can get serious later. Therefore, it is very important for you to come back here or go to the nearest Emergency Department if you don't get better or your symptoms get worse.     You may have been referred to get an MRI of your spine or an EMG. These tests look at your problem more closely.    Follow up with your doctor or the referral doctor as soon as possible.    YOU SHOULD SEEK MEDICAL ATTENTION IMMEDIATELY, EITHER HERE OR AT THE NEAREST EMERGENCY DEPARTMENT, IF ANY OF THE FOLLOWING OCCURS:     You lose bowel or bladder control (you soil or wet yourself).   You feel week or can't use your leg(s).   The medication doesn't help the pain.   You have a fever (temperature higher than 100.27F or 38C) or shaking chills.   You have serious pain over one bone (vertebra) in your back.    If you can't follow up with your doctor, or if at any time  you feel you need to be rechecked or seen again, come back here or go to the nearest emergency department.

## 2020-03-02 NOTE — ED Triage Notes (Signed)
Pt.states that she has shooting pain in the back and chest, that started 2 weeks ago, pt has hx of chronic back pain

## 2020-03-05 NOTE — Telephone Encounter (Signed)
I see the latest labs done  by her was in 01/2020 with NP Outzen.    I haven't ordered any labs for her in the recent.

## 2020-03-06 ENCOUNTER — Telehealth (INDEPENDENT_AMBULATORY_CARE_PROVIDER_SITE_OTHER): Payer: Self-pay | Admitting: Internal Medicine

## 2020-03-06 NOTE — Telephone Encounter (Signed)
Actually these labs were ordered by Dr. Minerva Ends and my name was attached. Please inform pt to talkt to Dr Cherric office for these labs.

## 2020-03-06 NOTE — Telephone Encounter (Signed)
Pt req to be squeezed in for sooner appt    She is having R breast pain - denies other sx and declined telemed    Pls advise    Phone no [phone]

## 2020-03-06 NOTE — Telephone Encounter (Signed)
No answer.  Left message to call the office.

## 2020-03-08 ENCOUNTER — Ambulatory Visit (INDEPENDENT_AMBULATORY_CARE_PROVIDER_SITE_OTHER): Payer: Medicare Other | Admitting: Family

## 2020-03-08 ENCOUNTER — Ambulatory Visit
Admission: RE | Admit: 2020-03-08 | Discharge: 2020-03-08 | Disposition: A | Discharge status: Home / Self Care | Payer: Medicare Other | Source: Ambulatory Visit | Attending: Family | Admitting: Family

## 2020-03-08 ENCOUNTER — Encounter (INDEPENDENT_AMBULATORY_CARE_PROVIDER_SITE_OTHER): Payer: Self-pay | Admitting: Family

## 2020-03-08 ENCOUNTER — Telehealth: Payer: Self-pay | Admitting: Emergency Medicine

## 2020-03-08 VITALS — BP 116/66 | HR 73 | Temp 99.2°F | Resp 16 | Ht 58.03 in | Wt 107.8 lb

## 2020-03-08 DIAGNOSIS — R768 Other specified abnormal immunological findings in serum: Secondary | ICD-10-CM

## 2020-03-08 DIAGNOSIS — R1012 Left upper quadrant pain: Secondary | ICD-10-CM

## 2020-03-08 DIAGNOSIS — R079 Chest pain, unspecified: Secondary | ICD-10-CM

## 2020-03-08 DIAGNOSIS — M546 Pain in thoracic spine: Secondary | ICD-10-CM

## 2020-03-08 DIAGNOSIS — G8929 Other chronic pain: Secondary | ICD-10-CM

## 2020-03-08 DIAGNOSIS — N644 Mastodynia: Secondary | ICD-10-CM

## 2020-03-08 MED ORDER — CYCLOBENZAPRINE HCL 5 MG PO TABS
ORAL_TABLET | ORAL | 0 refills | Status: DC
Start: 2020-03-08 — End: 2021-06-03

## 2020-03-08 NOTE — Progress Notes (Signed)
Subjective:      Patient ID: Michele [name] is a 79 y.o. female     Chief Complaint   Patient presents with    Right breast pain     On and off for a while    LUQ     For 2 weeks        HPI   Pt is here for recurrent right sided chest/breast pain.  She was seen by me on 01/03/2020 for right-sided rib pain, x-rays were completed.  No abnormal findings besides degenerative changes noted.  Patient also complains of left upper quadrant pain which pretty sharp and causes her significant discomfort.  Patient has multiple musculoskeletal and spine related problems.  [facility]Dr[facility]) ordered multiple labs and multiple imaging studies.  MRI thoracic spine on 01/19/2020 shows following:  "IMPRESSION:      1. Redemonstrated are diffuse degenerative disc and endplate changes and  multilevel posterior element degenerative changes. These are detailed  level by level in the body of the  2. No significant interval changes at T2/T3 where has been worsening of  degenerative facet changes now with a small anterolisthesis. Marrow  edema of the facets now noted compatible with associated abnormal  biomechanics. Bilateral foraminal stenosis at this level.  3. Multilevel disc bulges again noted most of which remain stable. There  has been increased central disc protrusion at T7/T8 causing slight  increased ventral deformity of the cord.  4. No cord lesion, severe central stenosis or severe cord compression"    Patient voices frustration with her ongoing health issues including full body tremors, upper back pain and upper right chest pain.    The following sections were reviewed this encounter by the provider:   Tobacco   Allergies   Meds   Problems   Med Hx   Surg Hx   Fam Hx          Review of Systems   Constitutional: Negative for chills, fatigue and fever.   HENT: Negative for congestion.    Respiratory: Negative for cough, chest tightness, shortness of breath and wheezing.    Cardiovascular:  Negative for chest pain, palpitations and leg swelling.   Gastrointestinal: Positive for abdominal pain. Negative for abdominal distention, diarrhea, nausea and vomiting.   Musculoskeletal: Positive for arthralgias, back pain and myalgias. Negative for gait problem.   Neurological: Positive for tremors. Negative for dizziness, seizures, speech difficulty, numbness and headaches.   Psychiatric/Behavioral: Positive for dysphoric mood (Upset about her health  AndFamily problems).          BP 116/66 (BP Site: Right arm, Patient Position: Sitting, Cuff Size: Small)    Pulse 73    Temp 99.2 F (37.3 C) (Tympanic)    Resp 16    Ht 1.474 m (4' 10.03")    Wt 48.9 kg (107 lb 12.9 oz)    SpO2 96%    BMI 22.51 kg/m     Objective:     Physical Exam  Vitals and nursing note reviewed.   Constitutional:       Appearance: She is well-developed.      Comments: Frail appearance   HENT:      Head: Normocephalic and atraumatic.   Cardiovascular:      Rate and Rhythm: Normal rate and regular rhythm.      Heart sounds: Normal heart sounds.   Pulmonary:      Effort: Pulmonary effort is normal.  Breath sounds: Normal breath sounds.   Chest:      Chest wall: Tenderness present.       Abdominal:      General: Abdomen is flat. Bowel sounds are normal.      Tenderness: There is abdominal tenderness in the left upper quadrant.       Skin:     General: Skin is warm and dry.   Neurological:      General: No focal deficit present.      Mental Status: She is alert and oriented to person, place, and time.   Psychiatric:         Mood and Affect: Mood is depressed. Affect is tearful.         Behavior: Behavior normal.         Thought Content: Thought content normal.         Judgment: Judgment normal.      Comments: Due to health problems family issues patient is tearful in the office          Assessment:     1. Breast pain, right  - US Breast Right Complete; Future    2. Right-sided chest pain  - X-ray chest PA and lateral; Future    3. LUQ  pain  - US Abdomen Complete; Future    4. Chronic thoracic back pain, unspecified back pain laterality  - cyclobenzaprine (FLEXERIL) 5 MG tablet; 1/2 to 1 tablet as needed every 8 hours PRN muscle spasm. Caution: causes sedation  Dispense: 20 tablet; Refill: 0    5. Positive ANA (antinuclear antibody)  - Rheumatology Referral: Sharen Hint, MD [facility])        Plan:     1.  Acute stable  Breast sonogram ordered  Recent mammogram from April 2021 showed no abnormal findings  2.  Recurrent, stable  Unclear etiology, possibly anxiety related   3.  Acute, stable  Unclear etiology    4.  Chronic, stable  Flexeril as prescribed, warm compresses upper back      5.  Positive ANA, rheumatology referral given  Appointment set up with rheumatology by our office      Medication list reviewed with patient and updated as indicated.  Risk & Benefits of any new medication(s) were explained to the patient who verbalized understanding & agreed to the treatment plan.  All questions are answered.Patient given a printed copy of AVS. Patient verbalized understanding of instructions given.    This note was generated by the Epic EMR system/ Dragon speech recognition and may contain inherent errors or omissions not intended by the user. Grammatical errors, random word insertions, deletions and pronoun errors  are occasional consequences of this technology due to software limitations. Not all errors are caught or corrected. If there are questions or concerns about the content of this note or information contained within the body of this dictation they should be addressed directly with the author for clarification.    Daun Rens Berna Spare, NP

## 2020-03-08 NOTE — Telephone Encounter (Signed)
Routine ED follow up call. No answer. LVM.

## 2020-03-08 NOTE — Progress Notes (Signed)
Have you seen any specialists/other providers since your last visit with [place]?    Yes    Arm preference verified?   No    The patient is due for spirometry, influenza vaccine and AD, COVID-19 Vaccine, AWV

## 2020-03-09 ENCOUNTER — Telehealth (INDEPENDENT_AMBULATORY_CARE_PROVIDER_SITE_OTHER): Payer: Self-pay | Admitting: Internal Medicine

## 2020-03-09 NOTE — Telephone Encounter (Signed)
Patient: [phone]    Patient needs new rheumatologist near her and not [place] [address].

## 2020-03-14 ENCOUNTER — Other Ambulatory Visit (INDEPENDENT_AMBULATORY_CARE_PROVIDER_SITE_OTHER): Payer: Self-pay | Admitting: Internal Medicine

## 2020-03-14 ENCOUNTER — Inpatient Hospital Stay: Payer: Medicare Other | Admitting: Rehabilitative and Restorative Service Providers"

## 2020-03-14 DIAGNOSIS — K2270 Barrett's esophagus without dysplasia: Secondary | ICD-10-CM

## 2020-03-14 NOTE — Telephone Encounter (Signed)
Called pt she will contact her insurance company.

## 2020-03-14 NOTE — Telephone Encounter (Signed)
Please advise to contact her insurance for rheumatologist close to her preferred location.

## 2020-03-20 ENCOUNTER — Inpatient Hospital Stay: Payer: Medicare Other | Admitting: Rehabilitative and Restorative Service Providers"

## 2020-03-20 ENCOUNTER — Encounter: Payer: Self-pay | Admitting: Rehabilitative and Restorative Service Providers"

## 2020-03-20 ENCOUNTER — Inpatient Hospital Stay: Payer: Medicare Other | Attending: Neurology | Admitting: Rehabilitative and Restorative Service Providers"

## 2020-03-20 VITALS — BP 105/63 | HR 61

## 2020-03-20 DIAGNOSIS — R251 Tremor, unspecified: Secondary | ICD-10-CM | POA: Insufficient documentation

## 2020-03-20 DIAGNOSIS — R2689 Other abnormalities of gait and mobility: Secondary | ICD-10-CM | POA: Insufficient documentation

## 2020-03-20 NOTE — PT/OT Therapy Note (Signed)
Name: Toma Deiters  Referring Physician: Laurie Panda*  Diagnosis:    ICD-10-CM    1. Functional gait abnormality  R26.89    2. Tremors of nervous system  R25.1        Precautions:   Please see evaluation      MD Follow-up:  --          Exercise Flow Sheet    Exercise Specifics         PN / FOTO     Walking program   FGA            Modified SLS leg on bosu    With dual tasking             Confidence balance plate   With ball toss               vectors                                                                                                                      Home Exercise Program    Initiate           (all exercises were completed with therapist supervision unless noted)    (White date box = 2 week treatment reminder)    Patient HEP:

## 2020-03-20 NOTE — Progress Notes (Signed)
Name:Michele [name] Age: 79 y.o.   Date of Service: 03/20/2020  Referring Physician: Laurie Panda*   Date of Injury: 12/08/2019  Date Care Plan Established/Reviewed: 03/20/2020  Date Treatment Started: 03/20/2020  End of Certification Date: 06/17/2020  Sessions in Plan of Care: 20  Surgery Date: No data was found      Visit Count: 1   Diagnosis:   1. Functional gait abnormality    2. Tremors of nervous system        Subjective     History of Present Illness   Mechanism of injury: Insidious onset   History of Present Illness: Pt reports 8-9years ago she started noticing tremors. She initially did not take it seriously, a couple years ago it started getting worse. She saw Dr. Roque Cash on June 3rd who did a number of tests which came back normal, the only thing that was + was cervical stenosis. He saw a neurosurgeon who told her that tremors never come because of stenosis.   She went back to Dr. Roque Cash who prescribed pills (unsure what, will bring next session). Pt reports that she was given muscle relaxants for the pain in her whole spine because she went to the hospital because of "big pain" on her neck and she could not move.     Pt reports that she currently does stretching as exercise. She had been seen by PT for low back pain with improvements with PT but that the pain always comes back in her back. Unsure about compliance with home program as she reports that she performs it "sometimes"     Occasionally walks 1-2 blocks 2-3x a week, but nothing consistent.     When asked about stress, reports that she is stressed about her family, money, and has been stressed for a long time. Reports that her daughter recently lost her job.       Functional Limitations (PLOF): Pt was a poor historian with subjective reports this session.     Lack of a regular exercise program (Would not typically exercise regularly)   Tremors all the time, reports in sitting, worsening in standing but not while walking (8-9 years ago no  tremors)    Social Support/Occupation  Lives in: multiple level home and community based residential facility  Lives with: adult children  Occupation: Retired      Precautions: No data was found  Allergies: Patient has no known allergies.    Past Medical History:   Diagnosis Date    Abnormal EKG     Arthralgia     Barrett's esophagus     Cervical spinal stenosis     Colonic polyp     Depression     Disc disorder     Gastroesophageal reflux disease     Hypercholesterolemia     Lower back pain     Numbness and tingling of both legs 02/06/2015    Osteoarthritis     Osteopenia             BP: 105/63 Heart Rate: 61          ---      ---   Total Time   Untimed Minutes  45 minutes   Total Time  45 minutes         Initial Evaluation Reference and/or Current Measurements(as dated):    OBJECTIVE:      Observation/Posture/Gait/Integumentary  (03/20/20)  Observation: Seated, pt did not have any tremors until specifically discussing tremors, starts to initiate cervical  tremor in sitting. Intermittent R leg tremor seated with subjective history. Upon standing, demonstrates increased L leg tremor. No intention tremor with reaching activities.     Integumentary: No wound, rash, or lesion noted  Posture: Patient demonstrates slight L sided cervical rotation at rest. No change with eyes open vs closed.   Gait: Slight decrease in truncal movements, no tremor noted       Cerebellar function/Coordination: (intact/deficit) Initial Right  (03/20/20) Initial Left   (03/20/20)   Fingertip to Nose Normal Normal    Heel to shin Normal  Normal    Tip to tip/Pad to pad Normal  Normal*   Dysdiadochokinesia Mild slowing Mild slowing    (blank fields were intentionally left blank)   * Dec LE tremor during the activity (performed in standing, with initially severe tremors)     Upper and Lower Motor Neuron Reflexes:  Initial: Intact/Absent  (03/20/20)   Hoffman Normal   Babinski Normal    (blank fields were intentionally left  blank)    Strength: Initial (R/L):   Myotomes MMT/5  (03/20/20)   C5 - Shldr ABD --   C6 - Elbow flex --   C7 - Elbow Ext --   C8 -Wrist Ext  --   L2 - hip flex 4/4+   L3 - Knee ext 5/ 3 p!    L4- Ankle DF  5/5   L5 - Knee flexion, hip ext 5/5   S1- 2 Ankle PF --   (blank fields were intentionally left blank)    03/20/20 tone assessment:   Quads: Normal Bilaterally to slow and quick stretch   Hamstrings: Normal Bilaterally to slow and quick stretch   Calves: Normal Bilaterally to slow and quick stretch     Sensation: Light touch  Initial (R/L)   (03/20/20)   Dermatomes (Intact/diminished/absent)   C5 --   C6 --   C7 --   C8 --   L1 Normal/ Normal    L2 Normal/ Normal    L3 Normal/ Normal    L4 Normal/ Normal    L5 Normal/ Normal    S1 Normal/ Normal    S2 Normal/ Normal    (blank fields were intentionally left blank)      Special Tests: Initial  (03/20/20)   TUG    Functional Reach Test    30 second sit to stand test    Four square step test    (blank fields were intentionally left blank)    Balance: Initial (R/L)  (03/20/20)   Single Leg Stance (sec) eyes open             21 /  5*    Double leg (sec) eyes closed on foam 15*    Double leg (sec) eyes open on foam  1 min    (blank fields were intentionally left blank)   * tremor       Assessment   Michele [name] is a 79 y.o. female presenting with tremor who requires Physical Therapy for the following:  Impairments: tremors, poor static balance, lack of exercise program      Pt demonstrated significant tremors in standing but improvements in tremors with dual tasking via coordination testing. Reported tremors even with sitting, when asked about what she meant about tremors demonstrated a seated cervical tremor (reported that it was involuntary). When seated and asked about medical history details, the tremor again would subside. Will contact neurologist to discuss symptoms.     Pain located: N/A pt  not being treated for pain     Clinical presentation: stable: some increase but  overall, demonstrated tremors for 8-9 years   Barriers to therapy: Time since onset of injury/illness/exacerbation: onset 8-9 years ago without cause or intervention   Comorbidities: Difficulties with subjective report at eval, history of knee pain limiting stair negotiation, severe neck pain, severe back pain   Functional Limitations (PLOF): Pt was a poor historian with subjective reports this session.     Lack of a regular exercise program (Would not typically exercise regularly)   Tremors all the time, reports in sitting, worsening in standing but not while walking (8-9 years ago no tremors)  Prognosis: good  Plan   Visits per week: 2  Number of Sessions: 20  [employer] One on One  91478: Therapeutic Exercise: To Develop Strength and Endurance, ROM and Flexibility  [id number]: Gait Training  29562: Neuromuscular Reeducation  97530: Therapeutic Activities: Dynamic activities to improve functional performance  Initiate HEP   Update goals as appropriate      Goals    Goal 1: Patient will demonstrate independence in prescribed HEP with proper form, sets and reps for safe discharge to an independent program.   Sessions: 20      Goal 2: Pt will demonstrate SLS to at least 10 seconds bilaterally to improve functional SLS and reduce risk of falls.    Sessions: 20                                      Carles Collet, DPT

## 2020-03-21 ENCOUNTER — Inpatient Hospital Stay: Payer: Medicare Other | Attending: Neurology | Admitting: Rehabilitative and Restorative Service Providers"

## 2020-03-21 DIAGNOSIS — R2689 Other abnormalities of gait and mobility: Secondary | ICD-10-CM | POA: Insufficient documentation

## 2020-03-21 DIAGNOSIS — R251 Tremor, unspecified: Secondary | ICD-10-CM | POA: Insufficient documentation

## 2020-03-21 NOTE — PT/OT Therapy Note (Signed)
Name:Michele [name] Age: 79 y.o.   Date of Service: 03/21/2020  Referring Physician: Laurie Panda*   Date of Injury: 12/08/2019  Date Care Plan Established/Reviewed: 03/20/2020  Date Treatment Started: 03/20/2020  End of Certification Date: 06/17/2020  Sessions in Plan of Care: 20  Surgery Date: No data was found      Visit Count: 2   Diagnosis:   1. Functional gait abnormality    2. Tremors of nervous system        Subjective     Social Support/Occupation  Lives in: multiple level home and community based residential facility  Lives with: adult children  Occupation: Retired    Pt reports that she cont to have same tremor since evaluation yesterday - compares it to feeling like a branch shaking on a tree. Tremor isn't worse on one side of the body or in one particular area.     Provided medication as requested from evaluation (medication from Dr. Roque Cash) - propranolol 80 mg 1x/day.      Precautions: No data was found  Allergies: Patient has no known allergies.    Past Medical History:   Diagnosis Date    Abnormal EKG     Arthralgia     Barrett's esophagus     Cervical spinal stenosis     Colonic polyp     Depression     Disc disorder     Gastroesophageal reflux disease     Hypercholesterolemia     Lower back pain     Numbness and tingling of both legs 02/06/2015    Osteoarthritis     Osteopenia                     Initial Evaluation Reference and/or Current Measurements(as dated):     Observation/Posture/Gait/Integumentary  (03/20/20)  Observation: Seated, pt did not have any tremors until specifically discussing tremors, starts to initiate cervical tremor in sitting. Intermittent R leg tremor seated with subjective history. Upon standing, demonstrates increased L leg tremor. No intention tremor with reaching activities.      Integumentary: No wound, rash, or lesion noted  Posture: Patient demonstrates slight L sided cervical rotation at rest. No change with eyes open vs closed.   Gait: Slight  decrease in truncal movements, no tremor noted         Cerebellar function/Coordination: (intact/deficit) Initial Right  (03/20/20) Initial Left   (03/20/20)   Fingertip to Nose Normal Normal    Heel to shin Normal  Normal    Tip to tip/Pad to pad Normal  Normal*   Dysdiadochokinesia Mild slowing Mild slowing    (blank fields were intentionally left blank)   * Dec LE tremor during the activity (performed in standing, with initially severe tremors)      Upper and Lower Motor Neuron Reflexes:  Initial: Intact/Absent  (03/20/20)   Hoffman Normal   Babinski Normal    (blank fields were intentionally left blank)     Strength: Initial (R/L):   Myotomes MMT/5  (03/20/20)   C5 - Shldr ABD --   C6 - Elbow flex --   C7 - Elbow Ext --   C8 -Wrist Ext  --   L2 - hip flex 4/4+   L3 - Knee ext 5/ 3 p!    L4- Ankle DF  5/5   L5 - Knee flexion, hip ext 5/5   S1- 2 Ankle PF --   (blank fields were intentionally left blank)  03/20/20 tone assessment:   Quads: Normal Bilaterally to slow and quick stretch   Hamstrings: Normal Bilaterally to slow and quick stretch   Calves: Normal Bilaterally to slow and quick stretch      Sensation: Light touch  Initial (R/L)   (03/20/20)   Dermatomes (Intact/diminished/absent)   C5 --   C6 --   C7 --   C8 --   L1 Normal/ Normal    L2 Normal/ Normal    L3 Normal/ Normal    L4 Normal/ Normal    L5 Normal/ Normal    S1 Normal/ Normal    S2 Normal/ Normal    (blank fields were intentionally left blank)        Special Tests: Initial  (03/20/20)   TUG     Functional Reach Test     30 second sit to stand test     Four square step test     (blank fields were intentionally left blank)     Balance: Initial (R/L)  (03/20/20)   Single Leg Stance (sec) eyes open             21 /  5*    Double leg (sec) eyes closed on foam 15*    Double leg (sec) eyes open on foam  1 min    (blank fields were intentionally left blank)   * tremor     Functional Gait Assessment     Activity  Scoring into 0, 1, 2, 3 points per  activity.  Normal = 3, Mild = 2, Moderate = 1, Severe = 0  Score  03/21/20       1. Gait Level Surfaces  2. Change in Gait Speed  3. Gait with Horizontal Head Turns  4. Gait with Vertical Head Turns  5. Gait and Pivot Turn  6. Step Over Obstacle  7. Gait with Narrow Base of Support  8. Gait with Eyes Closed  9. Ambulating Backwards  10. Steps 1. 1  2. 2  3. 1  4. 1  5. 1  6. 2  7. 2  8. 1  9. 2  10. 1     TOTAL SCORE (_/30) 14/30             Treatment     Therapeutic Exercises - Justified to address any of the following: develop strength, endurance, ROM and/or flexibility.   Time spent obtaining subjective history to determine nature of treatment session    Considerable time spent discussing initiation of walking program and answering pt questions regarding benefits of exercise with pain management and expectations for progression    Neuromuscular Re-Education - Justified to address any of the following: of movement, balance, coordination, kinesthetic sense, posture and/or proprioception for sitting and/or standing activities.   Time spent performing FGA to determine CLOF    SLS contra foot on ball to address SL balance and dual tasking strategies    Reviewed tandem balance with head turns for HEP    Considerable time spent reviewing updated HEP with pt to ensure proper form and understanding with completion       ---      ---   Total Time   Timed Minutes  43 minutes   Total Time  43 minutes        Assessment   Considerable time spent at beginning of session obtaining subjective history pertaining to tremor history and back pain. Pt became emotional due to history of dissatisfaction with previous healthcare providers  and feeling that they weren't paying her attention and so time allowed for pt to collect herself prior to initiating testing/interventions.    With FGA, inconsistent scoring noted with proprioceptive vs vestibular vs vision biased tasks and tremor onset so difficult to determine if any specified balance  system is impaired at this time. Additionally, while discussing prior health care history with pt, noted observable improvement in tremor vs sitting at rest, again, inconsistent with orthostatic tremor initial diagnosis.     Initiated dual tasking interventions which pt able to complete well with distraction from task, but increase in tremor with intrinsic cueing for form. Would cont to benefit from more extrinsic cueing and cueing pre/post intervention to improve overall form.    Plan   Cont per PT POC:  - F/u on updated HEP and walking program  - Cont to address dual tasking and tremor      Goals    Goal 1: Patient will demonstrate independence in prescribed HEP with proper form, sets and reps for safe discharge to an independent program.   Sessions: 20      Goal 2: Pt will demonstrate SLS to at least 10 seconds bilaterally to improve functional SLS and reduce risk of falls.    Sessions: 20                                      Donato Schultz, DPT     Name: Toma Deiters             Referring Physician: Laurie Panda*  Diagnosis:      ICD-10-CM     1. Functional gait abnormality  R26.89     2. Tremors of nervous system  R25.1           Precautions:   Please see evaluation                                    MD Follow-up:  as sonogram scheduled for abdomen and breast due to reports of pain on Sept 20                                                        Exercise Flow Sheet     Exercise Specifics 03/21/20               PN / FOTO      Walking program    FGA                    Modified SLS leg on bosu  With dual tasking  With "I-spy" x4 rounds bil                    Confidence balance plate With ball toss  --                    vectors    --                   Tandem balance with head rot   reviewed for HEP  Home Exercise Program       Initiate                   (all exercises were completed with therapist supervision unless noted)     (White date box = 2 week treatment reminder)     Patient HEP:    Access Code: [email or web address]  URL: [email or web address]  Date: 03/21/2020  Prepared by: Janace Hoard    Exercises   Half Tandem Stance Balance with Head Rotation - 1 x daily - 7 x weekly - 3 sets - 10 reps     Provided print out of 4 week progressive walking program starting at 5 min walk/day

## 2020-03-24 ENCOUNTER — Inpatient Hospital Stay: Payer: Medicare Other | Attending: Neurology | Admitting: Rehabilitative and Restorative Service Providers"

## 2020-03-24 DIAGNOSIS — R2689 Other abnormalities of gait and mobility: Secondary | ICD-10-CM | POA: Insufficient documentation

## 2020-03-24 DIAGNOSIS — R251 Tremor, unspecified: Secondary | ICD-10-CM | POA: Insufficient documentation

## 2020-03-24 NOTE — PT/OT Therapy Note (Signed)
Name:Michele [name] Age: 79 y.o.   Date of Service: 03/24/2020  Referring Physician: Laurie Panda*   Date of Injury: 12/08/2019  Date Care Plan Established/Reviewed: 03/20/2020  Date Treatment Started: 03/20/2020  End of Certification Date: 06/17/2020  Sessions in Plan of Care: 20  Surgery Date: No data was found      Visit Count: 3   Diagnosis:   1. Functional gait abnormality    2. Tremors of nervous system        Subjective     Social Support/Occupation  Lives in: multiple level home and community based residential facility  Lives with: adult children  Occupation: Retired    Pt reports that her tremors are always bad. They never get better.  They get worse, but she is unsure when or what causes them to get worse.       Precautions: No data was found  Allergies: Patient has no known allergies.    Past Medical History:   Diagnosis Date    Abnormal EKG     Arthralgia     Barrett's esophagus     Cervical spinal stenosis     Colonic polyp     Depression     Disc disorder     Gastroesophageal reflux disease     Hypercholesterolemia     Lower back pain     Numbness and tingling of both legs 02/06/2015    Osteoarthritis     Osteopenia                           Initial Evaluation Reference and/or Current Measurements(as dated):     Observation/Posture/Gait/Integumentary  (03/20/20)  Observation: Seated, pt did not have any tremors until specifically discussing tremors, starts to initiate cervical tremor in sitting. Intermittent R leg tremor seated with subjective history. Upon standing, demonstrates increased L leg tremor. No intention tremor with reaching activities.      Integumentary: No wound, rash, or lesion noted  Posture: Patient demonstrates slight L sided cervical rotation at rest. No change with eyes open vs closed.   Gait: Slight decrease in truncal movements, no tremor noted         Cerebellar function/Coordination: (intact/deficit) Initial Right  (03/20/20) Initial Left   (03/20/20)    Fingertip to Nose Normal Normal    Heel to shin Normal  Normal    Tip to tip/Pad to pad Normal  Normal*   Dysdiadochokinesia Mild slowing Mild slowing    (blank fields were intentionally left blank)   * Dec LE tremor during the activity (performed in standing, with initially severe tremors)      Upper and Lower Motor Neuron Reflexes:  Initial: Intact/Absent  (03/20/20)   Hoffman Normal   Babinski Normal    (blank fields were intentionally left blank)     Strength: Initial (R/L):   Myotomes MMT/5  (03/20/20)   C5 - Shldr ABD --   C6 - Elbow flex --   C7 - Elbow Ext --   C8 -Wrist Ext  --   L2 - hip flex 4/4+   L3 - Knee ext 5/ 3 p!    L4- Ankle DF  5/5   L5 - Knee flexion, hip ext 5/5   S1- 2 Ankle PF --   (blank fields were intentionally left blank)     03/20/20 tone assessment:   Quads: Normal Bilaterally to slow and quick stretch   Hamstrings: Normal Bilaterally to slow and quick  stretch   Calves: Normal Bilaterally to slow and quick stretch      Sensation: Light touch  Initial (R/L)   (03/20/20)   Dermatomes (Intact/diminished/absent)   C5 --   C6 --   C7 --   C8 --   L1 Normal/ Normal    L2 Normal/ Normal    L3 Normal/ Normal    L4 Normal/ Normal    L5 Normal/ Normal    S1 Normal/ Normal    S2 Normal/ Normal    (blank fields were intentionally left blank)        Special Tests: Initial  (03/20/20)   TUG     Functional Reach Test     30 second sit to stand test     Four square step test     (blank fields were intentionally left blank)     Balance: Initial (R/L)  (03/20/20)   Single Leg Stance (sec) eyes open             21 /  5*    Double leg (sec) eyes closed on foam 15*    Double leg (sec) eyes open on foam  1 min    (blank fields were intentionally left blank)   * tremor     Functional Gait Assessment     Activity  Scoring into 0, 1, 2, 3 points per activity.  Normal = 3, Mild = 2, Moderate = 1, Severe = 0  Score  03/21/20       1. Gait Level Surfaces  2. Change in Gait Speed  3. Gait with Horizontal Head  Turns  4. Gait with Vertical Head Turns  5. Gait and Pivot Turn  6. Step Over Obstacle  7. Gait with Narrow Base of Support  8. Gait with Eyes Closed  9. Ambulating Backwards  10. Steps 1. 1  2. 2  3. 1  4. 1  5. 1  6. 2  7. 2  8. 1  9. 2  10. 1     TOTAL SCORE (_/30) 14/30             PSFS  9/17  Shower standing  7/10  Stairs going upstairs>downstairs 7/10  Standing donn/dogg undergarments 4/10       Treatment     Therapeutic Exercises - Justified to address any of the following: develop strength, endurance, ROM and/or flexibility.   Nu Step with time for set up required and subjective history obtained.     Reviewed PSFS with patient to futher determine functional limitations as result of tremors     Neuromuscular Re-Education - Justified to address any of the following: of movement, balance, coordination, kinesthetic sense, posture and/or proprioception for sitting and/or standing activities.   Steps to target with randomized cues MAX cues for initation and accuracy of targeting with visual cues and repeated cues.    Forward step with BUE abd MAX mirroring and demo required for sequencing    Step side BUE MAX mirroring and demo and cues for sequencing    STS with heel raised with ball CGA requred    Educated pt in comprehensible terms on causes of tremors - and provided time for questions        ---      ---   Total Time   Timed Minutes  40 minutes   Total Time  40 minutes        Assessment   Some improvement noted with tremor amplitude (as in  tremor amplitude reduced) with addition of weighting to improve proprioceptive input. Pt challenged with initiation and accuracy with multi-directional stepping requiring heavy cues. Will continue to benefit from skilled PT to meet functional goals.   Plan   Cont per PT POC:  - F/u on updated HEP and walking program  - Cont to address dual tasking and tremor      Goals    Goal 1: Patient will demonstrate independence in prescribed HEP with proper form, sets and reps for safe  discharge to an independent program.   Sessions: 20      Goal 2: Pt will demonstrate SLS to at least 10 seconds bilaterally to improve functional SLS and reduce risk of falls.    Sessions: 20                                      Marilynne Drivers, DPT     Name: Toma Deiters             Referring Physician: Laurie Panda*  Diagnosis:      ICD-10-CM     1. Functional gait abnormality  R26.89     2. Tremors of nervous system  R25.1           Precautions:   Please see evaluation, propranolol 80 mg 1x/day   MD Follow-up:  as sonogram scheduled for abdomen and breast due to reports of pain on Sept 20                                                        Exercise Flow Sheet     Exercise Specifics 03/21/20  9/18             PN / FOTO      Walking program    FGA  Nu step 7'                  Modified SLS leg on bosu  With dual tasking  With "I-spy" x4 rounds bil  1.5 #ankle  1 # wrist  Step forward UE ext                    Confidence balance plate With ball toss  --  1.5 #ankle  1 # wrist  Step side UE ext                   vectors    --  x5 #1.5 ankle                 Tandem balance with head rot   reviewed for HEP  Sit to stands with OH ball raise                           Steps to 3 targets x30  Home Exercise Program      Initiate                   (all exercises were completed with therapist supervision unless noted)     (White date box = 2 week treatment reminder)     Patient HEP:    Access Code: [email or web address]  URL: [email or web address]  Date: 03/21/2020  Prepared by: Janace Hoard    Exercises   Half Tandem Stance Balance with Head Rotation - 1 x daily - 7 x weekly - 3 sets - 10 reps     Provided print out of 4 week progressive walking program starting at 5 min walk/day

## 2020-03-26 ENCOUNTER — Ambulatory Visit: Payer: Medicare Other

## 2020-03-26 ENCOUNTER — Ambulatory Visit (INDEPENDENT_AMBULATORY_CARE_PROVIDER_SITE_OTHER): Payer: Medicare Other | Admitting: Family

## 2020-03-27 ENCOUNTER — Other Ambulatory Visit (INDEPENDENT_AMBULATORY_CARE_PROVIDER_SITE_OTHER): Payer: Self-pay | Admitting: Family

## 2020-03-27 ENCOUNTER — Inpatient Hospital Stay: Payer: Medicare Other | Admitting: Rehabilitative and Restorative Service Providers"

## 2020-03-27 DIAGNOSIS — R1012 Left upper quadrant pain: Secondary | ICD-10-CM

## 2020-03-29 ENCOUNTER — Encounter (INDEPENDENT_AMBULATORY_CARE_PROVIDER_SITE_OTHER): Payer: Self-pay | Admitting: Family

## 2020-03-30 ENCOUNTER — Encounter (INDEPENDENT_AMBULATORY_CARE_PROVIDER_SITE_OTHER): Payer: Self-pay | Admitting: Family

## 2020-03-30 ENCOUNTER — Telehealth (INDEPENDENT_AMBULATORY_CARE_PROVIDER_SITE_OTHER): Payer: Self-pay | Admitting: Family

## 2020-03-30 ENCOUNTER — Other Ambulatory Visit (INDEPENDENT_AMBULATORY_CARE_PROVIDER_SITE_OTHER): Payer: Self-pay | Admitting: Family

## 2020-03-30 ENCOUNTER — Inpatient Hospital Stay: Payer: Medicare Other | Admitting: Rehabilitative and Restorative Service Providers"

## 2020-03-30 DIAGNOSIS — H6980 Other specified disorders of Eustachian tube, unspecified ear: Secondary | ICD-10-CM

## 2020-03-30 NOTE — Telephone Encounter (Signed)
Patient calls requesting for a call back from NP [facility]. Pt is requesting for additional imaging orders as she continues to have discomfort around her breast area.       Patient Preferred Callback Number: [phone]             Pt has an apt scheduled for 04/04/20

## 2020-04-03 ENCOUNTER — Inpatient Hospital Stay: Payer: Medicare Other | Admitting: Rehabilitative and Restorative Service Providers"

## 2020-04-03 NOTE — Telephone Encounter (Signed)
Do you want to address at 04/04/20 O.V.

## 2020-04-03 NOTE — Telephone Encounter (Signed)
I ordered R breast sonogram already, she needs to complete that

## 2020-04-04 ENCOUNTER — Ambulatory Visit
Admission: RE | Admit: 2020-04-04 | Discharge: 2020-04-04 | Disposition: A | Discharge status: Home / Self Care | Payer: Medicare Other | Source: Ambulatory Visit | Attending: Family | Admitting: Family

## 2020-04-04 DIAGNOSIS — R1012 Left upper quadrant pain: Secondary | ICD-10-CM | POA: Insufficient documentation

## 2020-04-04 NOTE — Telephone Encounter (Signed)
Returned patient call.  No answer.  Left message to call the office.

## 2020-04-06 ENCOUNTER — Ambulatory Visit: Payer: Medicare Other

## 2020-04-09 ENCOUNTER — Inpatient Hospital Stay: Payer: Medicare Other | Admitting: Rehabilitative and Restorative Service Providers"

## 2020-04-09 ENCOUNTER — Ambulatory Visit (INDEPENDENT_AMBULATORY_CARE_PROVIDER_SITE_OTHER): Payer: Medicare Other | Admitting: Internal Medicine

## 2020-04-10 ENCOUNTER — Ambulatory Visit: Payer: Medicare Other

## 2020-04-10 ENCOUNTER — Other Ambulatory Visit (INDEPENDENT_AMBULATORY_CARE_PROVIDER_SITE_OTHER): Payer: Self-pay | Admitting: Internal Medicine

## 2020-04-10 DIAGNOSIS — K2270 Barrett's esophagus without dysplasia: Secondary | ICD-10-CM

## 2020-04-11 ENCOUNTER — Inpatient Hospital Stay: Payer: Medicare Other | Admitting: Rehabilitative and Restorative Service Providers"

## 2020-04-12 ENCOUNTER — Ambulatory Visit: Payer: Medicare Other

## 2020-04-16 ENCOUNTER — Inpatient Hospital Stay: Payer: Medicare Other | Admitting: Rehabilitative and Restorative Service Providers"

## 2020-04-16 NOTE — Telephone Encounter (Signed)
Follow call to patient, however no answer.  Left voicemail message to call the office back.

## 2020-04-18 ENCOUNTER — Inpatient Hospital Stay: Payer: Medicare Other | Admitting: Rehabilitative and Restorative Service Providers"

## 2020-04-23 ENCOUNTER — Inpatient Hospital Stay: Payer: Medicare Other | Admitting: Rehabilitative and Restorative Service Providers"

## 2020-04-25 ENCOUNTER — Inpatient Hospital Stay: Payer: Medicare Other | Admitting: Rehabilitative and Restorative Service Providers"

## 2020-04-30 ENCOUNTER — Inpatient Hospital Stay: Payer: Medicare Other | Admitting: Rehabilitative and Restorative Service Providers"

## 2020-04-30 NOTE — Progress Notes (Signed)
Discontinuation of Therapy Services    Michele [name] did not complete prescribed physical therapy visits.      Status is unknown at this time, physical therapy has been discontinued and patient has been discharged from care.  The last therapy note is below for review.    Please feel free to contact me with any questions regarding the care of Michele [name].    Sincerely,    Marilynne Drivers, PT DPT    04/30/2020      Name:Michele [name] Age: 79 y.o.   Date of Service: 03/24/2020  Referring Physician: Laurie Panda*   Date of Injury: 12/08/2019  Date Care Plan Established/Reviewed: 03/20/2020  Date Treatment Started: 03/20/2020  End of Certification Date: 06/17/2020  Sessions in Plan of Care: 20  Surgery Date: No data was found      Visit Count: 3   Diagnosis:   1. Functional gait abnormality    2. Tremors of nervous system        Subjective     Social Support/Occupation  Lives in: multiple level home and community based residential facility  Lives with: adult children  Occupation: Retired    Pt reports that her tremors are always bad. They never get better.  They get worse, but she is unsure when or what causes them to get worse.       Precautions: No data was found  Allergies: Patient has no known allergies.    Past Medical History        Past Medical History:   Diagnosis Date    Abnormal EKG     Arthralgia     Barrett's esophagus     Cervical spinal stenosis     Colonic polyp     Depression     Disc disorder     Gastroesophageal reflux disease     Hypercholesterolemia     Lower back pain     Numbness and tingling of both legs 02/06/2015    Osteoarthritis     Osteopenia                              Initial Evaluation Reference and/or Current Measurements(as dated):    Observation/Posture/Gait/Integumentary  (03/20/20)  Observation:Seated, pt did not have any tremors until specifically discussing tremors, starts to initiate cervical tremorin sitting. Intermittent R leg tremor  seated with subjective history. Upon standing,demonstrates increased L leg tremor. No intention tremor with reaching activities.    Integumentary:No wound, rash, or lesion noted  Posture:Patient demonstrates slight L sided cervical rotation at rest. No change with eyes open vs closed.  Gait:Slight decrease in truncal movements, no tremor noted      Cerebellar function/Coordination: (intact/deficit) Initial Right  (03/20/20) Initial Left   (03/20/20)   Fingertip to Nose Normal Normal   Heel to shin Normal  Normal   Tip to tip/Pad to pad Normal  Normal*   Dysdiadochokinesia Mild slowing Mild slowing   (blank fields were intentionally left blank)  * Dec LE tremor during the activity(performed in standing, with initially severe tremors)    Upper and Lower Motor Neuron Reflexes:  Initial: Intact/Absent  (03/20/20)   Hoffman Normal   Babinski Normal   (blank fields were intentionally left blank)    Strength: Initial (R/L):   Myotomes MMT/5  (03/20/20)   C5 - Shldr ABD --   C6 - Elbow flex --   C7 - Elbow Ext --   C8 -Wrist  Ext --   L2 - hip flex 4/4+   L3 - Knee ext 5/ 3 p!   L4- Ankle DF 5/5   L5 - Knee flexion, hip ext 5/5   S1- 2 Ankle PF --   (blank fields were intentionally left blank)    03/20/20 tone assessment:   Quads: Normal Bilaterally to slow and quick stretch   Hamstrings: Normal Bilaterally to slow and quick stretch   Calves: Normal Bilaterally to slow and quick stretch    Sensation: Light touch Initial (R/L)   (03/20/20)   Dermatomes (Intact/diminished/absent)   C5 --   C6 --   C7 --   C8 --   L1 Normal/ Normal   L2 Normal/ Normal   L3 Normal/ Normal   L4 Normal/ Normal   L5 Normal/ Normal   S1 Normal/ Normal   S2 Normal/ Normal   (blank fields were intentionally left blank)      Special Tests: Initial  (03/20/20)   TUG    Functional Reach Test    30 second sit to stand test    Four square step test    (blank fields were intentionally left blank)    Balance: Initial  (R/L)  (03/20/20)   Single Leg Stance (sec) eyes open 21 / 5*    Double leg(sec) eyes closed on foam 15*    Double leg (sec) eyes open on foam  1 min   (blank fields were intentionally left blank)  * tremor    Functional Gait Assessment    Activity  Scoring into 0, 1, 2, 3 points per activity. Normal = 3, Mild = 2, Moderate = 1, Severe = 0  Score 03/21/20      1. Gait Level Surfaces  2. Change in Gait Speed  3. Gait with Horizontal Head Turns  4. Gait with Vertical Head Turns  5. Gait and Pivot Turn  6. Step Over Obstacle  7. Gait with Narrow Base of Support  8. Gait with Eyes Closed  9. Ambulating Backwards  10. Steps 1. 1  2. 2  3. 1  4. 1  5. 1  6. 2  7. 2  8. 1  9. 2  10. 1    TOTAL SCORE (_/30) 14/30          PSFS  9/17  Shower standing  7/10  Stairs going upstairs>downstairs 7/10  Standing donn/dogg undergarments 4/10       Treatment     Therapeutic Exercises - Justified to address any of the following: develop strength, endurance, ROM and/or flexibility.   Nu Step with time for set up required and subjective history obtained.     Reviewed PSFS with patient to futher determine functional limitations as result of tremors     Neuromuscular Re-Education - Justified to address any of the following: of movement, balance, coordination, kinesthetic sense, posture and/or proprioception for sitting and/or standing activities.   Steps to target with randomized cues MAX cues for initation and accuracy of targeting with visual cues and repeated cues.    Forward step with BUE abd MAX mirroring and demo required for sequencing    Step side BUE MAX mirroring and demo and cues for sequencing    STS with heel raised with ball CGA requred    Educated pt in comprehensible terms on causes of tremors - and provided time for questions         ---      ---   Total Time  Timed Minutes  40 minutes   Total Time  40 minutes        Assessment   Some improvement noted with tremor amplitude (as in  tremor amplitude reduced) with addition of weighting to improve proprioceptive input. Pt challenged with initiation and accuracy with multi-directional stepping requiring heavy cues. Will continue to benefit from skilled PT to meet functional goals.   Plan   Cont per PT POC:  - F/u on updated HEP and walking program  - Cont to address dual tasking and tremor         Goals    Goal 1: Patient will demonstrate independence in prescribed HEP with proper form, sets and reps for safe discharge to an independent program.   Sessions: 20      Goal 2: Pt will demonstrate SLS to at least 10 seconds bilaterally to improve functional SLS and reduce risk of falls.    Sessions: 20                                      Marilynne Drivers, DPT

## 2020-04-30 NOTE — PT/OT Therapy Note (Signed)
Discontinuation of Therapy Services    Michele [name] did not complete prescribed physical therapy visits.      Status is unknown at this time, physical therapy has been discontinued and patient has been discharged from care.  The last therapy note is below for review.    Please feel free to contact me with any questions regarding the care of [name].    Sincerely,    Marilynne Drivers, PT DPT    04/30/2020

## 2020-05-02 ENCOUNTER — Inpatient Hospital Stay: Payer: Medicare Other | Admitting: Rehabilitative and Restorative Service Providers"

## 2020-05-10 ENCOUNTER — Other Ambulatory Visit (INDEPENDENT_AMBULATORY_CARE_PROVIDER_SITE_OTHER): Payer: Self-pay | Admitting: Internal Medicine

## 2020-05-10 DIAGNOSIS — K2270 Barrett's esophagus without dysplasia: Secondary | ICD-10-CM

## 2020-05-14 ENCOUNTER — Telehealth (INDEPENDENT_AMBULATORY_CARE_PROVIDER_SITE_OTHER): Payer: Self-pay | Admitting: Family

## 2020-05-14 NOTE — Telephone Encounter (Signed)
Patient needs to be seen for MRI referral, f/u and is requesting to be squeezed in for a sooner appointment than available. Please return call at noted phone number below.    Patient is currently scheduled for -  06/01/2020    Patient declined to schedule next available appointment        Patient Preferred Callback Number: [phone]

## 2020-05-15 ENCOUNTER — Encounter (INDEPENDENT_AMBULATORY_CARE_PROVIDER_SITE_OTHER): Payer: Self-pay

## 2020-05-15 NOTE — Telephone Encounter (Signed)
Placed on waitlist

## 2020-05-17 ENCOUNTER — Ambulatory Visit: Payer: Medicare Other

## 2020-05-17 NOTE — Progress Notes (Signed)
05/17/20 1200   Pain History   Pain Symptoms Pain  (Neck/LBP shaking throught the body)   Pain Location Neck; Lower Back; Hip  Right; Hip  Left; Buttock  Right; Buttock  Left   Pain Description Dull/Aching; Spasms/Cramping   Pain Frequency Constant   Sensation Symptoms Weakness   Sensation Location Lower Back; Leg  Right; Leg  Left   Sensation Description Tightness   Sensation Frequency Constant   Treatments   Have you visited a chiropractor for this problem? Yes   If so, when? Within the last year   Have you ever received an injection for this problem? No   Diagnostic Tests   Plain Spine X-ray Lumbar   Care Management   How did you hear about our program MD Referral   [facility] Appointment   [facility] Appointment Location No appt made/HMO Referral needed

## 2020-05-19 ENCOUNTER — Other Ambulatory Visit (INDEPENDENT_AMBULATORY_CARE_PROVIDER_SITE_OTHER): Payer: Self-pay | Admitting: Internal Medicine

## 2020-05-19 DIAGNOSIS — K2270 Barrett's esophagus without dysplasia: Secondary | ICD-10-CM

## 2020-05-22 ENCOUNTER — Other Ambulatory Visit (INDEPENDENT_AMBULATORY_CARE_PROVIDER_SITE_OTHER): Payer: Self-pay | Admitting: Internal Medicine

## 2020-05-22 DIAGNOSIS — K2270 Barrett's esophagus without dysplasia: Secondary | ICD-10-CM

## 2020-05-23 ENCOUNTER — Other Ambulatory Visit (INDEPENDENT_AMBULATORY_CARE_PROVIDER_SITE_OTHER): Payer: Self-pay | Admitting: Family

## 2020-05-23 DIAGNOSIS — E785 Hyperlipidemia, unspecified: Secondary | ICD-10-CM

## 2020-05-29 ENCOUNTER — Other Ambulatory Visit (INDEPENDENT_AMBULATORY_CARE_PROVIDER_SITE_OTHER): Payer: Self-pay | Admitting: Internal Medicine

## 2020-05-29 DIAGNOSIS — K2270 Barrett's esophagus without dysplasia: Secondary | ICD-10-CM

## 2020-06-01 ENCOUNTER — Encounter (INDEPENDENT_AMBULATORY_CARE_PROVIDER_SITE_OTHER): Payer: Self-pay | Admitting: Family

## 2020-06-01 ENCOUNTER — Ambulatory Visit (INDEPENDENT_AMBULATORY_CARE_PROVIDER_SITE_OTHER): Payer: Medicare Other | Admitting: Family

## 2020-06-01 VITALS — BP 121/69 | HR 88 | Temp 97.7°F | Wt 102.8 lb

## 2020-06-01 DIAGNOSIS — G8929 Other chronic pain: Secondary | ICD-10-CM

## 2020-06-01 DIAGNOSIS — K2270 Barrett's esophagus without dysplasia: Secondary | ICD-10-CM

## 2020-06-01 DIAGNOSIS — R29898 Other symptoms and signs involving the musculoskeletal system: Secondary | ICD-10-CM

## 2020-06-01 DIAGNOSIS — M5442 Lumbago with sciatica, left side: Secondary | ICD-10-CM

## 2020-06-01 MED ORDER — OMEPRAZOLE 20 MG PO CPDR
20.0000 mg | DELAYED_RELEASE_CAPSULE | Freq: Every day | ORAL | 0 refills | Status: DC | PRN
Start: 2020-06-01 — End: 2021-11-13

## 2020-06-01 NOTE — Progress Notes (Signed)
Subjective:      Patient ID: Michele [name] is a 79 y.o. female     Chief Complaint   Patient presents with    Back Pain     has an order for an MRI from an outside provider    Gastroesophageal Reflux     needs refills on Omeprazole        Back Pain  This is a chronic problem. The problem occurs constantly. The problem has been gradually worsening since onset. The pain is present in the gluteal and lumbar spine. The quality of the pain is described as aching and shooting. The pain is moderate. Associated symptoms include weakness. Pertinent negatives include no abdominal pain, bladder incontinence, bowel incontinence, chest pain, fever, headaches or numbness. Risk factors include menopause, lack of exercise, sedentary lifestyle and poor posture. She has tried chiropractic manipulation for the symptoms. The treatment provided moderate relief.   Gastroesophageal Reflux  She complains of heartburn. She reports no abdominal pain, no belching, no chest pain, no coughing, no early satiety, no globus sensation, no nausea or no wheezing. This is a chronic problem. The problem occurs frequently. The heartburn is located in the substernum. Pertinent negatives include no fatigue.           The following sections were reviewed this encounter by the provider:   Tobacco   Allergies   Meds   Problems   Med Hx   Surg Hx   Fam Hx          Review of Systems   Constitutional: Negative for chills, fatigue and fever.   HENT: Negative for congestion.    Respiratory: Negative for cough, chest tightness, shortness of breath and wheezing.    Cardiovascular: Negative for chest pain, palpitations and leg swelling.   Gastrointestinal: Positive for heartburn. Negative for abdominal pain, bowel incontinence, diarrhea, nausea and vomiting.   Genitourinary: Negative for bladder incontinence.   Musculoskeletal: Positive for arthralgias, back pain, gait problem, myalgias and neck pain. Negative for joint swelling and neck stiffness.   Neurological:  Positive for tremors and weakness. Negative for dizziness, seizures, speech difficulty, numbness and headaches.          BP 121/69 (BP Site: Left arm, Patient Position: Sitting, Cuff Size: Medium)    Pulse 88    Temp 97.7 F (36.5 C) (Tympanic)    Wt 46.6 kg (102 lb 12.8 oz)    BMI 21.46 kg/m     Objective:     Physical Exam  Vitals and nursing note reviewed.   Constitutional:       Appearance: She is well-developed.      Comments: Frail elderly   HENT:      Head: Normocephalic and atraumatic.   Cardiovascular:      Rate and Rhythm: Normal rate and regular rhythm.      Heart sounds: Normal heart sounds.   Pulmonary:      Effort: Pulmonary effort is normal.      Breath sounds: Normal breath sounds.   Musculoskeletal:         General: No deformity.      Cervical back: Normal range of motion and neck supple.      Lumbar back: Tenderness present. No swelling, edema, deformity, lacerations, spasms or bony tenderness. Decreased range of motion.        Back:    Skin:     General: Skin is warm and dry.   Neurological:      General: No focal  deficit present.      Mental Status: She is alert and oriented to person, place, and time.   Psychiatric:         Mood and Affect: Mood normal.         Behavior: Behavior normal.         Thought Content: Thought content normal.         Judgment: Judgment normal.          Assessment:     1. Chronic bilateral low back pain with left-sided sciatica  - MRI lumbar spine without contrast    2. Weakness of left leg  - MRI lumbar spine without contrast    3. Barrett's esophagus without dysplasia  - omeprazole (PriLOSEC) 20 MG capsule; Take 1 capsule (20 mg total) by mouth daily as needed (GERD)  Dispense: 90 capsule; Refill: 0        Plan:   1, 2) Chronic, worsening  Significant pain and report LLE weakness as well  MRI ordered    3) Chronic, stable  Take medication as prescribed      Medication list reviewed with patient and updated as indicated.  Risk & Benefits of any new medication(s) were  explained to the patient who verbalized understanding & agreed to the treatment plan.  All questions are answered.Patient given a printed copy of AVS. Patient verbalized understanding of instructions given.        Saahir Prude Berna Spare, NP

## 2020-06-01 NOTE — Progress Notes (Signed)
Have you seen any specialists/other providers since your last visit with [place]?    Yes    Arm preference verified?   Yes    Health Maintenance Due   Topic Date Due    Advance Directive on File  Never done    Spirometry Annual  Never done    COVID-19 Vaccine (1) Never done    Pneumonia Vaccine Age 80+ (1 of 2 - PPSV23) 01/21/2016    Medicare Annual Wellness Visit  04/22/2019    FALLS RISK ANNUAL  05/12/2020

## 2020-06-21 ENCOUNTER — Telehealth (INDEPENDENT_AMBULATORY_CARE_PROVIDER_SITE_OTHER): Payer: Self-pay | Admitting: Internal Medicine

## 2020-06-21 NOTE — Telephone Encounter (Signed)
Yes, how do I do that?

## 2020-06-21 NOTE — Telephone Encounter (Signed)
Yes please

## 2020-06-21 NOTE — Telephone Encounter (Signed)
MRI L-Spine was denied. Would you like to do a Peer to Peer? Tel: [phone] case # [id number].

## 2020-06-21 NOTE — Telephone Encounter (Signed)
If you agree, I would call the insurance and set you up to speak with another MD on their end.

## 2020-06-26 ENCOUNTER — Ambulatory Visit
Admission: RE | Admit: 2020-06-26 | Discharge: 2020-06-26 | Disposition: A | Discharge status: Home / Self Care | Payer: Medicare Other | Source: Ambulatory Visit | Attending: Family | Admitting: Family

## 2020-06-26 ENCOUNTER — Ambulatory Visit: Admission: RE | Admit: 2020-06-26 | Payer: Medicare Other | Source: Ambulatory Visit

## 2020-06-26 DIAGNOSIS — M5136 Other intervertebral disc degeneration, lumbar region: Secondary | ICD-10-CM | POA: Insufficient documentation

## 2020-06-26 DIAGNOSIS — M5137 Other intervertebral disc degeneration, lumbosacral region: Secondary | ICD-10-CM | POA: Insufficient documentation

## 2020-06-26 DIAGNOSIS — M9954 Intervertebral disc stenosis of neural canal of sacral region: Secondary | ICD-10-CM | POA: Insufficient documentation

## 2020-06-26 DIAGNOSIS — M9953 Intervertebral disc stenosis of neural canal of lumbar region: Secondary | ICD-10-CM | POA: Insufficient documentation

## 2020-06-26 DIAGNOSIS — M4316 Spondylolisthesis, lumbar region: Secondary | ICD-10-CM | POA: Insufficient documentation

## 2020-06-26 DIAGNOSIS — M47817 Spondylosis without myelopathy or radiculopathy, lumbosacral region: Secondary | ICD-10-CM | POA: Insufficient documentation

## 2020-06-26 DIAGNOSIS — M48061 Spinal stenosis, lumbar region without neurogenic claudication: Secondary | ICD-10-CM | POA: Insufficient documentation

## 2020-06-26 DIAGNOSIS — R29898 Other symptoms and signs involving the musculoskeletal system: Secondary | ICD-10-CM | POA: Insufficient documentation

## 2020-06-26 DIAGNOSIS — M47816 Spondylosis without myelopathy or radiculopathy, lumbar region: Secondary | ICD-10-CM | POA: Insufficient documentation

## 2020-06-26 DIAGNOSIS — G8929 Other chronic pain: Secondary | ICD-10-CM | POA: Insufficient documentation

## 2020-06-26 DIAGNOSIS — M5442 Lumbago with sciatica, left side: Secondary | ICD-10-CM | POA: Insufficient documentation

## 2020-07-10 ENCOUNTER — Ambulatory Visit (INDEPENDENT_AMBULATORY_CARE_PROVIDER_SITE_OTHER): Payer: Medicare Other | Admitting: Family

## 2020-07-18 ENCOUNTER — Other Ambulatory Visit: Payer: Medicare Other

## 2020-07-19 ENCOUNTER — Encounter (INDEPENDENT_AMBULATORY_CARE_PROVIDER_SITE_OTHER): Payer: Self-pay | Admitting: Family

## 2020-07-23 NOTE — Progress Notes (Signed)
Subjective:      Patient ID: Michele [name] is a 80 y.o. female     Chief Complaint   Patient presents with    Back Pain        Back Pain  This is a chronic problem. The pain is present in the lumbar spine and thoracic spine. The pain is mild. Pertinent negatives include no abdominal pain, bladder incontinence, bowel incontinence, chest pain, fever, numbness, paresis, paresthesias or weakness.   Knee Pain   There was no injury mechanism. The quality of the pain is described as shooting. The pain is moderate. The pain has been intermittent since onset. Associated symptoms include an inability to bear weight and muscle weakness. Pertinent negatives include no numbness. The symptoms are aggravated by movement, weight bearing and palpation. She has tried NSAIDs for the symptoms. The treatment provided moderate relief.      Pt has chronic back pain due to lumbar stenosis, [facility]iki [facility]). She also sees [facility]. Recent MRI of L spine shows following    "IMPRESSION:    Degenerative changes involving the lumbosacral spine.  Moderate to severe central canal stenosis at the L4-5 level. Annular  fissures in the L4-5 and L5-S1 intervertebral disc. Neural foraminal  stenoses. "    Additionally, pt was seeing by ortho [facility] (dr. Janee Morn) for her knee pain. Per pt she was told that there is nothing wrong with her knee. She would like to get a second opinion.      The following sections were reviewed this encounter by the provider:   Tobacco   Allergies   Meds   Problems   Med Hx   Surg Hx   Fam Hx          Review of Systems   Constitutional: Negative for chills, fatigue and fever.   HENT: Negative for congestion.    Respiratory: Negative for cough.    Cardiovascular: Negative for chest pain.   Gastrointestinal: Negative for abdominal pain and bowel incontinence.   Genitourinary: Negative for bladder incontinence and flank pain.   Musculoskeletal: Positive for arthralgias, back  pain, gait problem and myalgias. Negative for joint swelling, neck pain and neck stiffness.   Neurological: Positive for tremors. Negative for dizziness, weakness, numbness and paresthesias.          BP 126/70 (BP Site: Left arm, Patient Position: Sitting, Cuff Size: Large)    Pulse 85    Temp 97.7 F (36.5 C) (Tympanic)    Ht 1.486 m (4' 10.5")    Wt 45 kg (99 lb 3.2 oz)    BMI 20.38 kg/m     Objective:     Physical Exam  Vitals and nursing note reviewed.   Constitutional:       Appearance: Normal appearance. She is well-developed.   HENT:      Head: Normocephalic and atraumatic.   Cardiovascular:      Rate and Rhythm: Normal rate and regular rhythm.      Heart sounds: Normal heart sounds.   Pulmonary:      Effort: Pulmonary effort is normal.      Breath sounds: Normal breath sounds.   Musculoskeletal:         General: No deformity.      Cervical back: Normal range of motion and neck supple.      Lumbar back: Tenderness present. No swelling, edema, deformity, lacerations, spasms or bony tenderness. Decreased range of motion.  Left knee: Crepitus present. Decreased range of motion. Tenderness present over the patellar tendon.      Comments: Lower extremity coarse tremors   Skin:     General: Skin is warm and dry.   Neurological:      General: No focal deficit present.      Mental Status: She is alert and oriented to person, place, and time.   Psychiatric:         Mood and Affect: Mood normal.         Behavior: Behavior normal.         Thought Content: Thought content normal.         Judgment: Judgment normal.          Assessment:     1. Lumbar radiculopathy  - Neurosurgery Referral: Orlan Leavens, MD ([place])    2. Spinal stenosis of lumbar region, unspecified whether neurogenic claudication present  - Neurosurgery Referral: Orlan Leavens, MD ([place])    3. Chronic pain of left knee  - Orthopedics Referral: Rush Farmer, MD    4. Primary osteoarthritis of left knee  - Orthopedics Referral: Rush Farmer, MD         Plan:   1,2) Pt would like to get neurosurgery consult.  Referral is given    3,4) Chronic, stable  Referral is given for second opinion    Medication list reviewed with patient and updated as indicated.  Risk & Benefits of any new medication(s) were explained to the patient who verbalized understanding & agreed to the treatment plan.  All questions are answered.Patient given a printed copy of AVS. Patient verbalized understanding of instructions given.      [name], NP

## 2020-07-24 ENCOUNTER — Ambulatory Visit (INDEPENDENT_AMBULATORY_CARE_PROVIDER_SITE_OTHER): Payer: Medicare Other | Admitting: Family

## 2020-07-24 ENCOUNTER — Encounter (INDEPENDENT_AMBULATORY_CARE_PROVIDER_SITE_OTHER): Payer: Self-pay | Admitting: Family

## 2020-07-24 VITALS — BP 126/70 | HR 85 | Temp 97.7°F | Ht 58.5 in | Wt 99.2 lb

## 2020-07-24 DIAGNOSIS — M25562 Pain in left knee: Secondary | ICD-10-CM

## 2020-07-24 DIAGNOSIS — G8929 Other chronic pain: Secondary | ICD-10-CM

## 2020-07-24 DIAGNOSIS — M48061 Spinal stenosis, lumbar region without neurogenic claudication: Secondary | ICD-10-CM

## 2020-07-24 DIAGNOSIS — M1712 Unilateral primary osteoarthritis, left knee: Secondary | ICD-10-CM

## 2020-07-24 DIAGNOSIS — M5416 Radiculopathy, lumbar region: Secondary | ICD-10-CM

## 2020-07-24 NOTE — Progress Notes (Signed)
Have you seen any specialists/other providers since your last visit with [place]?    Yes, chiropractor     Arm preference verified?   Yes    The patient is due for advance directive, spirometry, COVID 19 vaccine, pneumonia vaccine, MAWV, flu vaccine

## 2020-08-01 ENCOUNTER — Telehealth (INDEPENDENT_AMBULATORY_CARE_PROVIDER_SITE_OTHER): Payer: Self-pay | Admitting: Neurological Surgery

## 2020-08-01 NOTE — Telephone Encounter (Signed)
lvm for patient [phone]) in regards to ref from provider Alice Reichert). Patient being ref to Dr. Chauncy Lean for (Lumbar Radiculopathy). Check to see if patient has had any recent (mri) done if so please schedule with [facility] next available.

## 2020-08-06 ENCOUNTER — Telehealth (INDEPENDENT_AMBULATORY_CARE_PROVIDER_SITE_OTHER): Payer: Self-pay | Admitting: Nurse Practitioner

## 2020-08-06 NOTE — Telephone Encounter (Signed)
Second time lvm for patient [phone]) in regards to ref from provider Alice Reichert) for patient to see (Dr. Chauncy Lean) for " Lumbar radioculopathy".

## 2020-08-08 ENCOUNTER — Ambulatory Visit (INDEPENDENT_AMBULATORY_CARE_PROVIDER_SITE_OTHER): Payer: Medicare Other | Admitting: Orthopaedic Surgery

## 2020-08-08 ENCOUNTER — Encounter (INDEPENDENT_AMBULATORY_CARE_PROVIDER_SITE_OTHER): Payer: Self-pay | Admitting: Orthopaedic Surgery

## 2020-08-08 ENCOUNTER — Ambulatory Visit (INDEPENDENT_AMBULATORY_CARE_PROVIDER_SITE_OTHER): Payer: Medicare Other

## 2020-08-08 VITALS — BP 123/78 | HR 81

## 2020-08-08 DIAGNOSIS — M25562 Pain in left knee: Secondary | ICD-10-CM

## 2020-08-08 DIAGNOSIS — M1712 Unilateral primary osteoarthritis, left knee: Secondary | ICD-10-CM

## 2020-08-08 MED ORDER — TRIAMCINOLONE ACETONIDE 40 MG/ML IJ SUSP
40.0000 mg | Freq: Once | INTRAMUSCULAR | Status: AC
Start: 2020-08-08 — End: 2020-08-08
  Administered 2020-08-08: 12:00:00 40 mg via INTRA_ARTICULAR

## 2020-08-10 NOTE — Progress Notes (Signed)
Chief complaint: Left knee pain    History:  80 year old female comes in for the left knee.  She has had a history of fracture in the knee.  Likely a patella fracture and subsequent removal of hardware.  She does not recall.  The knee is aching in nature.  Most of her pain is anterior.  Its worse going up and down steps.  She denies any fevers or chills.  She denies any recent traumas.    Past medical history, surgical history and social history are in the chart and have been reviewed  Family history noncontributory    ALLERGIES and medications are in the chart and I reviewed them    Review of symptoms: Negative for fevers, chills, chest pain, shortness of breath.  Remainder of the review symptoms was otherwise fully negative for all 10 systems    Physical Examination:  Alert and oriented female in no acute distress afebrile, vital signs stable.  Examination of the bilateral lower extremities reveals she sensory and vascularly intact with 5 out of 5 strength all major muscle groups.  Left knee shows well-healed incision.  No effusion today well-maintained range of motion.  Positive patellofemoral grind.  Mild tenderness palpation of the medial and lateral joint lines.    Imaging: Bilateral weightbearing films and a lateral and merchant of the left knee obtained.  They do reveal moderate degenerative joint disease.  Particularly in the patellofemoral compartment.  No fractures or dislocations.    Impression and plan:   80 year old female has left knee arthritis.  We will treat her conservatively.  I will give her cortisone injections today.  She has done physical therapy in the past and may do exercises on her own.  Follow-up as needed.    Procedure: Left knee was injected with 1 mL of Kenalog and 4 mL of lidocaine in a sterile manner.  The patient tolerated this well.

## 2020-08-14 ENCOUNTER — Encounter (INDEPENDENT_AMBULATORY_CARE_PROVIDER_SITE_OTHER): Payer: Self-pay | Admitting: Family

## 2020-08-14 ENCOUNTER — Ambulatory Visit (INDEPENDENT_AMBULATORY_CARE_PROVIDER_SITE_OTHER): Payer: Medicare Other | Admitting: Family

## 2020-08-14 VITALS — BP 127/65 | HR 67 | Temp 97.9°F | Ht 58.5 in | Wt 96.6 lb

## 2020-08-14 DIAGNOSIS — D693 Immune thrombocytopenic purpura: Secondary | ICD-10-CM

## 2020-08-14 DIAGNOSIS — E46 Unspecified protein-calorie malnutrition: Secondary | ICD-10-CM | POA: Insufficient documentation

## 2020-08-14 DIAGNOSIS — Z0000 Encounter for general adult medical examination without abnormal findings: Secondary | ICD-10-CM

## 2020-08-14 DIAGNOSIS — J449 Chronic obstructive pulmonary disease, unspecified: Secondary | ICD-10-CM

## 2020-08-14 DIAGNOSIS — G959 Disease of spinal cord, unspecified: Secondary | ICD-10-CM

## 2020-08-14 LAB — CBC AND DIFFERENTIAL
Absolute NRBC: 0 10*3/uL (ref 0.00–0.00)
Basophils Absolute Automated: 0.02 10*3/uL (ref 0.00–0.08)
Basophils Automated: 0.5 %
Eosinophils Absolute Automated: 0.03 10*3/uL (ref 0.00–0.44)
Eosinophils Automated: 0.7 %
Hematocrit: 38.9 % (ref 34.7–43.7)
Hgb: 12.6 g/dL (ref 11.4–14.8)
Immature Granulocytes Absolute: 0.01 10*3/uL (ref 0.00–0.07)
Immature Granulocytes: 0.2 %
Lymphocytes Absolute Automated: 0.96 10*3/uL (ref 0.42–3.22)
Lymphocytes Automated: 23 %
MCH: 31 pg (ref 25.1–33.5)
MCHC: 32.4 g/dL (ref 31.5–35.8)
MCV: 95.6 fL (ref 78.0–96.0)
MPV: 11.9 fL (ref 8.9–12.5)
Monocytes Absolute Automated: 0.32 10*3/uL (ref 0.21–0.85)
Monocytes: 7.7 %
Neutrophils Absolute: 2.84 10*3/uL (ref 1.10–6.33)
Neutrophils: 67.9 %
Nucleated RBC: 0 /100 WBC (ref 0.0–0.0)
Platelets: 151 10*3/uL (ref 142–346)
RBC: 4.07 10*6/uL (ref 3.90–5.10)
RDW: 12 % (ref 11–15)
WBC: 4.18 10*3/uL (ref 3.10–9.50)

## 2020-08-14 LAB — URINALYSIS
Bilirubin, UA: NEGATIVE
Blood, UA: NEGATIVE
Glucose, UA: NEGATIVE
Ketones UA: NEGATIVE
Leukocyte Esterase, UA: NEGATIVE
Nitrite, UA: NEGATIVE
Protein, UR: NEGATIVE
Specific Gravity UA: 1.023 (ref 1.001–1.035)
Urine pH: 6 (ref 5.0–8.0)
Urobilinogen, UA: 0.2 (ref 0.2–2.0)

## 2020-08-14 LAB — LIPID PANEL
Cholesterol / HDL Ratio: 2.2
Cholesterol: 153 mg/dL (ref 0–199)
HDL: 71 mg/dL (ref 40–9999)
LDL Calculated: 73 mg/dL (ref 0–99)
Triglycerides: 44 mg/dL (ref 34–149)
VLDL Calculated: 9 mg/dL — ABNORMAL LOW (ref 10–40)

## 2020-08-14 LAB — COMPREHENSIVE METABOLIC PANEL
ALT: 17 U/L (ref 0–55)
AST (SGOT): 19 U/L (ref 5–34)
Albumin/Globulin Ratio: 1.6 (ref 0.9–2.2)
Albumin: 4.2 g/dL (ref 3.5–5.0)
Alkaline Phosphatase: 61 U/L (ref 37–117)
Anion Gap: 6 (ref 5.0–15.0)
BUN: 24 mg/dL — ABNORMAL HIGH (ref 7.0–19.0)
Bilirubin, Total: 0.6 mg/dL (ref 0.2–1.2)
CO2: 30 mEq/L — ABNORMAL HIGH (ref 21–29)
Calcium: 8.9 mg/dL (ref 7.9–10.2)
Chloride: 104 mEq/L (ref 100–111)
Creatinine: 0.7 mg/dL (ref 0.4–1.5)
Globulin: 2.6 g/dL (ref 2.0–3.7)
Glucose: 89 mg/dL (ref 70–100)
Potassium: 4 mEq/L (ref 3.5–5.1)
Protein, Total: 6.8 g/dL (ref 6.0–8.3)
Sodium: 140 mEq/L (ref 136–145)

## 2020-08-14 LAB — TSH: TSH: 3.25 u[IU]/mL (ref 0.35–4.94)

## 2020-08-14 LAB — GFR: EGFR: >60

## 2020-08-14 LAB — HEMOLYSIS INDEX: Hemolysis Index: 12 (ref 0–24)

## 2020-08-14 NOTE — Progress Notes (Signed)
Subjective:       Michele [name] is a 80 y.o. female and is here for a comprehensive physical exam. The patient reports  see below.  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    COPD (chronic obstructive pulmonary disease) 11/26/2015    Tobacco abuse 03/05/2016    Hoffman sign present 09/12/2016    Hyperlipidemia 04/28/2018    Benign neoplasm of rectum 03/20/2017    Benign neoplasm of transverse colon 03/20/2017    Pain in elbow 03/23/2014    Hiatal hernia 03/20/2017    Paresthesia of lower extremity 02/06/2015    Cervical myelopathy 07/21/2019    Positive ANA (antinuclear antibody) 03/08/2020    Lumbar radiculopathy 07/24/2020    Protein-calorie malnutrition, unspecified severity 08/14/2020     Resolved Ambulatory Problems     Diagnosis Date Noted    Thrombocytopenia 02/18/2013    Bilateral low back pain, unspecified chronicity, unspecified whether sciatica present 09/18/2014    Generalized weakness 06/16/2016    Muscle weakness 07/14/2016    Bilateral low back pain with left-sided sciatica, unspecified chronicity 06/22/2018    Complex tear of medial meniscus of left knee as current injury, subsequent encounter 03/23/2019    Other specified aftercare following surgery 03/23/2019    Chronic back pain 02/18/2013    Functional gait abnormality 03/20/2020    Tremors of nervous system 03/20/2020     Past Medical History:   Diagnosis Date    Abnormal EKG     Arthralgia     Barrett's esophagus     Cervical spinal stenosis     Colonic polyp     Depression     Disc disorder     Gastroesophageal reflux disease     Hypercholesterolemia     Lower back pain     Numbness and tingling of both legs 02/06/2015    Osteoarthritis      Problem   Protein-Calorie  Malnutrition, Unspecified Severity   Copd (Chronic Obstructive Pulmonary Disease)    Stable. Was On symbicort daily and albuterol inhaler prn. . Reports she has not been taking her inhaler and has been doing good. deneis any SOB or wheezing or chest tightness. .    11/12/15;  Spirometry - suggestive of moderate to severe Obstructive disease. FEv1- 52 %.   Chronic h/o smoking- 50 yrs. 7-11 cig/ day   CXR- normal.           Do you take any herbs or supplements that were not prescribed by a doctor? yes  Are you taking calcium supplements? Calcium  Are you taking aspirin daily? no    GU History:   Any STD's in the past? none  Diet: Mediterranean DIET,mostly   The following portions of the patient's history were reviewed and updated as appropriate: allergies, current medications, past family history, past medical history, past social history, past surgical history and problem list.    Review of Systems  Do you have pain that bothers you in your daily life? yes  Pertinent items are noted in HPI.     Objective:      BP 127/65 (BP Site: Left arm, Patient Position: Sitting, Cuff Size: X-Large)    Pulse 67    Temp 97.9 F (  36.6 C) (Tympanic)    Ht 1.486 m (4' 10.5")    Wt (!) 43.8 kg (96 lb 9.6 oz)    BMI 19.85 kg/m     General Appearance:    Alert, cooperative, no distress, appears stated age   Head:    Normocephalic, without obvious abnormality, atraumatic   Eyes:    PERRL, conjunctiva/corneas clear, EOM's intact, fundi     benign, both eyes   Ears:    Normal TM's and external ear canals, both ears   Nose:   Nares normal, septum midline, mucosa normal, no drainage    or sinus tenderness   Throat:   Lips, mucosa, and tongue normal; teeth and gums normal   Neck:   Supple, symmetrical, trachea midline, no adenopathy;     thyroid:  no enlargement/tenderness/nodules; no carotid    bruit or JVD   Back:     Symmetric, no curvature, ROM normal, no CVA tenderness   Lungs:     Clear to auscultation bilaterally, respirations unlabored    Chest Wall:    No tenderness or deformity    Heart:    Regular rate and rhythm, S1 and S2 normal, no murmur, rub   or gallop   Breast Exam:    No tenderness, masses, or nipple abnormality   Abdomen:     Soft, non-tender, bowel sounds active all four quadrants,     no masses, no organomegaly   Genitalia:    Normal female without lesion, discharge or tenderness   Rectal:    Normal tone, normal prostate, no masses or tenderness;    guaiac negative stool   Extremities:   Extremities normal, atraumatic, no cyanosis or edema   Pulses:   2+ and symmetric all extremities   Skin:   Skin color, texture, turgor normal, no rashes or lesions   Lymph nodes:   Cervical, supraclavicular, and axillary nodes normal   Neurologic:   CNII-XII intact, normal strength, sensation and reflexes     throughout         Assessment:      Healthy female exam.        1. Well adult exam  CBC and differential    Lipid panel    Comprehensive metabolic panel    TSH    Urinalysis   2. Protein-calorie malnutrition, unspecified severity     3. Cervical myelopathy     4. Chronic obstructive pulmonary disease, unspecified COPD type     5. Idiopathic thrombocytopenia purpura         Plan:      1. Labs pending  2. Patient Counseling:  --Nutrition: Stressed importance of moderation in sodium/caffeine intake, saturated fat and cholesterol, caloric balance, sufficient intake of fresh fruits, vegetables, fiber, calcium, iron, and 1 mg of folate supplement per day (for females capable of pregnancy).  --Discussed the issue of estrogen replacement, calcium supplement, and the daily use of baby aspirin.  --Exercise: Stressed the importance of regular exercise.   ---Injury prevention: Discussed safety belts, safety helmets, smoke detector, smoking near bedding or upholstery.   --Dental health: Discussed importance of regular tooth brushing, flossing, and dental visits.  --Immunizations reviewed.  --Discussed benefits of screening colonoscopy.Not a  candidate  --After hours service discussed with patient    3. Discussed the patient's BMI with her.  The BMI is in the acceptable range  4. Follow up as needed for acute illness

## 2020-08-14 NOTE — Progress Notes (Signed)
Have you seen any specialists/other providers since your last visit with [place]?    No    Arm preference verified?   Yes    The patient is due for advance directive, spirometry, COVID 19 vaccine, pneumonia vaccine, MAWV and flu vaccine              COVID-19 Ambulatory Phone Screening:    Are you currently experiencing fever, cough, or shortness of breath? No    If no to above: Are you currently experiencing chills, sore throat, new headache, loss of taste or smell, or body aches not attributable to physical activity? No

## 2020-09-10 NOTE — Progress Notes (Signed)
Subjective:      Patient ID: Michele [name] is a 80 y.o. female     Chief Complaint   Patient presents with    Discuss lab results    Tremor  LLE     getting worse        HPI   Chronic lower back pain and shooting pain down her left thigh.  She was seen by neurosurgery, has significant lumbar stenosis.  She was referred to pain management, at this time she does not want to see pain management.  Would like referral to PT.   The following sections were reviewed this encounter by the provider:   Tobacco   Allergies   Meds   Problems   Med Hx   Surg Hx   Fam Hx          Review of Systems   Constitutional: Negative for chills, fatigue and fever.   HENT: Negative for congestion.    Respiratory: Negative for cough, chest tightness, shortness of breath and wheezing.    Cardiovascular: Negative for chest pain, palpitations and leg swelling.   Gastrointestinal: Negative for diarrhea, nausea and vomiting.   Musculoskeletal: Positive for arthralgias and back pain.   Neurological: Positive for tremors and weakness. Negative for dizziness, seizures, speech difficulty, numbness and headaches.          BP 122/58 (BP Site: Right arm, Patient Position: Sitting, Cuff Size: Medium)    Pulse 75    Temp 97.9 F (36.6 C) (Tympanic)    Resp 16    Ht 1.508 m (4' 11.37")    Wt (!) 44.7 kg (98 lb 8.7 oz)    BMI 19.66 kg/m     Objective:     Physical Exam  Vitals and nursing note reviewed.   Constitutional:       Appearance: Normal appearance. She is well-developed.   HENT:      Head: Normocephalic and atraumatic.   Cardiovascular:      Rate and Rhythm: Normal rate and regular rhythm.      Heart sounds: Normal heart sounds.   Pulmonary:      Effort: Pulmonary effort is normal.      Breath sounds: Normal breath sounds.   Musculoskeletal:      Lumbar back: Tenderness present. Decreased range of motion.        Back:    Skin:     General: Skin is warm and dry.   Neurological:      Mental Status: She is alert and oriented to person, place, and  time.   Psychiatric:         Behavior: Behavior normal.         Thought Content: Thought content normal.         Judgment: Judgment normal.          Assessment:     1. Spinal stenosis of lumbar region, unspecified whether neurogenic claudication present  - Referral to Physical Therapy-[facility]    2. Unsteady gait  - Referral to Physical Therapy-[facility]    3. Osteopenia of lumbar spine  - Dxa Bone Density Axial Skeleton; Future    4. Menopausal and perimenopausal disorder  - Dxa Bone Density Axial Skeleton; Future        Plan:     1 and 2.  Chronic, stable  Referral is provided for physical therapy    3 and 4.  Last DEXA scan from 2019 showed osteopenia, patient Caucasian, smoker and thin framed.  Repeat DEXA scan to rule out osteoporosis      Medication list reviewed with patient and updated as indicated.  Risk & Benefits of any new medication(s) were explained to the patient who verbalized understanding & agreed to the treatment plan.  All questions are answered.Patient verbalized understanding of instructions given.    Jeriann Sayres Berna Spare, NP

## 2020-09-11 ENCOUNTER — Encounter (INDEPENDENT_AMBULATORY_CARE_PROVIDER_SITE_OTHER): Payer: Self-pay | Admitting: Family

## 2020-09-11 ENCOUNTER — Ambulatory Visit (INDEPENDENT_AMBULATORY_CARE_PROVIDER_SITE_OTHER): Payer: Medicare Other | Admitting: Family

## 2020-09-11 VITALS — BP 122/58 | HR 75 | Temp 97.9°F | Resp 16 | Ht 59.37 in | Wt 98.5 lb

## 2020-09-11 DIAGNOSIS — N959 Unspecified menopausal and perimenopausal disorder: Secondary | ICD-10-CM

## 2020-09-11 DIAGNOSIS — M8588 Other specified disorders of bone density and structure, other site: Secondary | ICD-10-CM

## 2020-09-11 DIAGNOSIS — R2681 Unsteadiness on feet: Secondary | ICD-10-CM

## 2020-09-11 DIAGNOSIS — M48061 Spinal stenosis, lumbar region without neurogenic claudication: Secondary | ICD-10-CM

## 2020-09-11 NOTE — Progress Notes (Signed)
Have you seen any specialists/other providers since your last visit with [place]?    Yes Neuro    Arm preference verified?   Yes, no preference    Health Maintenance Due   Topic Date Due    Advance Directive on File  Never done    Spirometry Annual  Never done    COVID-19 Vaccine (1) Never done    Pneumonia Vaccine Age 81+ (1 of 2 - PPSV23) 01/21/2016    Medicare Annual Wellness Visit  04/22/2019    INFLUENZA VACCINE  02/05/2020

## 2020-09-14 ENCOUNTER — Ambulatory Visit
Admission: RE | Admit: 2020-09-14 | Discharge: 2020-09-14 | Disposition: A | Discharge status: Home / Self Care | Payer: Medicare Other | Source: Ambulatory Visit | Attending: Family | Admitting: Family

## 2020-09-14 DIAGNOSIS — N959 Unspecified menopausal and perimenopausal disorder: Secondary | ICD-10-CM | POA: Insufficient documentation

## 2020-09-14 DIAGNOSIS — M8588 Other specified disorders of bone density and structure, other site: Secondary | ICD-10-CM | POA: Insufficient documentation

## 2020-09-21 ENCOUNTER — Telehealth (INDEPENDENT_AMBULATORY_CARE_PROVIDER_SITE_OTHER): Payer: Self-pay | Admitting: Family

## 2020-09-21 NOTE — Telephone Encounter (Signed)
Received call from patient who was returning Nurse Kitty's call regarding DEXA results.    Spoke to patient informed patient with DEXA results who verbalized understanding.

## 2020-09-27 ENCOUNTER — Inpatient Hospital Stay: Payer: Medicare Other | Attending: Family | Admitting: Physical Therapist

## 2020-09-27 ENCOUNTER — Encounter: Payer: Self-pay | Admitting: Physical Therapist

## 2020-09-27 VITALS — BP 111/64 | HR 81

## 2020-09-27 DIAGNOSIS — R531 Weakness: Secondary | ICD-10-CM | POA: Insufficient documentation

## 2020-09-27 DIAGNOSIS — R2689 Other abnormalities of gait and mobility: Secondary | ICD-10-CM | POA: Insufficient documentation

## 2020-09-27 DIAGNOSIS — M5459 Other low back pain: Secondary | ICD-10-CM | POA: Insufficient documentation

## 2020-09-27 NOTE — Progress Notes (Signed)
Name:Michele [name] Age: 80 y.o.   Date of Service: 09/27/2020  Referring Physician: Eliezer Lofts, NP   Date of Injury: 09/11/2020  Date Care Plan Established/Reviewed: 09/27/2020  Date Treatment Started: 09/27/2020  End of Certification Date: 12/25/2020  Sessions in Plan of Care: 16  Surgery Date: No data was found  MD Follow-up: No data was found  Medbridge Code: No data was found    Visit Count: 1   Diagnosis:   1. Other abnormalities of gait and mobility    2. Decreased strength    3. Arthralgia of lumbar spine        Subjective     History of Present Illness   History of Present Illness: Pt stated problem: Pt reports she has spinal stenosis. She feels really uncomfortable in low back. She feels weak because her legs are shaking with standing through L LE and into low back. Feels like shaking and pressure. Reports her L leg gives out on her. She did have pain in neck before but it has sinced moved down into her low back. She reports that caused her to feel like skin was very sensitive and it felt like it was shrinking. Reports she has not had any falls recently. This is a chronic problem with her leg. No new changes. When she broke her patella in 2018 she really noticed that things became more difficult.     Mechanism of injury: insidious onset    Pain location: mid to low back pain     Pain description: pressure throughout LE    Aggravators: Standing, (shaking all the time), difficulty with stairs    Patient stated goals:  Standing and walking without shaking as much.    Imaging  "  IMPRESSION:    Degenerative changes involving the lumbosacral spine.  Moderate to severe central canal stenosis at the L4-5 level. Annular  fissures in the L4-5 and L5-S1 intervertebral disc. Neural foraminal  stenoses. Please review above."    Denies changes in bowel and bladder. Denies saddle region paraesthesia. She does have numbness in L LE. Reports she does have cramping into L leg. She feels a burning sensation into both  legs    Functional Limitations (PLOF): Unable to standing without LE shaking; unable to stand for >15 min (previously has had shaking but increase instability in 2018 after knee fx)  Unable to negotiate stairs with reciprocal gait pattern and without use of railing (previously had difficulty with stairs since 2018 due to instability in LE and feelings of shaking throughout LE)      Social Support/Occupation  Lives in: multiple level home and community based residential facility  Lives with: adult children  Occupation: Retired      Precautions: No data was found  Allergies: Patient has no known allergies.    Past Medical History:   Diagnosis Date    Abnormal EKG     Arthralgia     Barrett's esophagus     Cervical spinal stenosis     Colonic polyp     Depression     Disc disorder     Gastroesophageal reflux disease     Hypercholesterolemia     Lower back pain     Neck pain     Numbness and tingling of both legs 02/06/2015    Osteoarthritis     Osteopenia        Objective     Posture     Head  Forward.    Shoulders  Rounded.    Balance   Left   Eyes Open (Left)(sec)      Trial 1: 3 seconds     Trial 2: 5 seconds     Trial 3: 10 seconds     Average: 6 seconds  Right   Eyes Open (Right)(sec)      Trial 1: 5 seconds     Trial 2: 10 seconds     Trial 3: 20 seconds     Average: 11.67 seconds    Gait   Left Side: toe walker  Right Side: toe walker  Knee buckling on L LE with stance phase  Slow cadence  Decreased step length  NBOS     Active Range of Motion (degrees)     Lumbar   Extension: with pain  Flexion functional reach: Floor  Left lateral flexion functional reach: Distal Thigh with pain  Right lateral flexion functional reach: Distal Thigh with pain    Additional Active Range of Motion Details  5x lumbar extension with sig increase in p! Into bilateral glute region     Strength/Myotome Testing (/5)     Left Hip   Planes of Motion   Flexion (L1/2): 4+ (POOR ACE)  External rotation: 5  Internal rotation:  5    Right Hip   Planes of Motion   Flexion (L1/2): 4+ (POOR ACE)  External rotation: 5  Internal rotation: 5    Left Knee   Flexion (S1): 4+  Extension (L3): 4- (tremors)    Right Knee   Flexion (S1): 4+  Extension (L3): 4- (with sig tremor)    Left Ankle/Foot   Dorsiflexion (L4): 5    Right Ankle/Foot   Dorsiflexion (L4): 4+    Neurological Testing     Sensation     Lumbar   Left   Intact: light touch    Right   Intact: light touch    Reflexes   Left   Patellar (L4): normal (2+)  Achilles (S1): normal (2+)  Clonus sign: negative    Right   Patellar (L4): normal (2+)  Achilles (S1): normal (2+)  Clonus sign: negative    Tandem stance   R   Trial 1: 10s  Trial 2: 25s  Trial 3: 30s    L  Trial 1: 30s     BP: 111/64 Heart Rate: 81          ---    Flowsheet Row ---   Total Time    Untimed Minutes 40 minutes   Total Time 40 minutes        Assessment   Nina is a 80 y.o. female presenting with mild discomfort in LB and sig tremors with WB activities who requires Physical Therapy for the impairments listed below.    Pain located: lumbar spine, L LE    Clinical presentation: evolving - neurological involvement with signs of return of function  Barriers to therapy: Patient's age - delayed healing  Time since onset of injury/illness/exacerbation - causing increased atrophy, resulting in multi-joint involvement and increased biomechanical compensation over time  Mechanism of injury/illness/exacerbation acute exacerbation of chronic problem and insidious onset with no known mechanism    Impairments: IPTC Impairments: Impaired postural alignment  Decreased range of motion  Decreased strength  Decreased functional stability  Decreased joint mobility  Decreased coordination  Decreased static balance  Decreased dynamic balance  Functional Limitations (PLOF): Unable to standing without LE shaking; unable to stand for >15 min (previously has had shaking  but increase instability in 2018 after knee fx)  Unable to negotiate stairs with  reciprocal gait pattern and without use of railing (previously had difficulty with stairs since 2018 due to instability in LE and feelings of shaking throughout LE)    Prognosis: good  Patient is aware of diagnosis, prognosis and consents to plan of care: Yes  Plan   Visits per week: 2  Number of Sessions: 16  Direct One on One  16109: Therapeutic Exercise: To Develop Strength and Endurance, ROM and Flexibility  [id number]: Gait Training  60454: Neuromuscular Reeducation (Proprioceptive Neuromuscular Faciliation)  97140: Manual Therapy techniques (mobilization, manipulation, manual traction) (Grade I-V to lumbar spine, pelvic girdle, and regionally interdependent joints, soft tissue mobilization, instrument assisted soft tissue mobilization.)  97530: Therapeutic Activities: Dynamic activities to improve functional performance  Dry Needling  Supervised Modalities  97010: Thermal modalities: hot/cold packs  09811: Mechnical traction  97014: Electrical stimulation  Plan for next session: balance progression, LE strengthening, lumbar gapping, neural glides??    FOTO to be collected in following visit      Goals    Goal 1: Pt will be able to demo HEP with zero to min cuing with good form to allow for independent performance at home and return to PLOF     Sessions: 16      Goal 2: Improve FOTO Score to match or exceed predicted FOTO score of >/= to demonstrate improvement of functional ability with physical therapeutic intervention.         Sessions: 16      Goal 3: Patient will be able to perform 5 repeated extensions in standing without incidence of low back pain, or pain referred from the back into the LE to allow patient to stand more than 30 minutes to wash dishes and clean without pain and without excessive effort         Sessions: 16      Goal 4: Patient will improve SL stance time to 30 seconds to allow patient to negotiate stairs safely reciprocally                                            Ronny Flurry,  DPT

## 2020-10-08 ENCOUNTER — Inpatient Hospital Stay: Payer: Medicare Other | Attending: Family | Admitting: Physical Therapist

## 2020-10-08 DIAGNOSIS — M5459 Other low back pain: Secondary | ICD-10-CM | POA: Insufficient documentation

## 2020-10-08 DIAGNOSIS — R2689 Other abnormalities of gait and mobility: Secondary | ICD-10-CM | POA: Insufficient documentation

## 2020-10-08 DIAGNOSIS — R531 Weakness: Secondary | ICD-10-CM | POA: Insufficient documentation

## 2020-10-08 NOTE — PT/OT Therapy Note (Signed)
Name: Michele [name] Age: 80 y.o.   Date of Service: 10/08/2020  Referring Physician: Eliezer Lofts, NP   Date of Injury: 09/11/2020  Date Care Plan Established/Reviewed: 09/27/2020  Date Treatment Started: 09/27/2020  End of Certification Date: 12/25/2020  Sessions in Plan of Care: 16  Surgery Date: No data was found  MD Follow-up: No data was found  Medbridge Code: No data was found    Visit Count: 2   Diagnosis:   1. Other abnormalities of gait and mobility    2. Decreased strength    3. Arthralgia of lumbar spine        Subjective     Outcome Measure   Tool Used/Details: FOTO   Score: 38  Predicted Functional Outcome: 51    Daily Subjective   Patient reports no changes since last visit.    Social Support/Occupation  Lives in: multiple level home and community based residential facility  Lives with: adult children  Occupation: Retired      Precautions: No data was found  Allergies: Patient has no known allergies.               Treatment     Therapeutic Exercises - Justified to address any of the following: develop strength, endurance, ROM and/or flexibility.   PT spent extensive time reading through FOTO with pt due to difficulty with English     Knee to chest stretch for lumbar gapping hold 30s x3 performed bilaterally    Grasshopper for hamstring lengthening and for sciatic n glide with VC for set up and proper form 2x8    Sidelying clamshell with VC for avoiding trunk rotation compensation and TC for proper muscular activation 3 x12      Neuromuscular Re-Education - Justified to address any of the following: of movement, balance, coordination, kinesthetic sense, posture and/or proprioception for sitting and/or standing activities.    Instructed and performed supine abdominal series with heels on ball with cues for hand and foot position and direction of force for each phase      Manual Therapy - Justified to address any of the following:  Mobilization of joints and soft tissues, manipulation, manual lymphatic  drainage, and/or manual traction.    STM lumbar paraspinals     Lumbar gapping with pelvic PD in R sidelying     Therapeutic Activity - Justified to address the following: activities to improve functional performance.  STS with VC for push through to improve weight acceptance and LE strengthening to improve ease with STS 3x10       ---    Flowsheet Row ---   Total Time    Timed Minutes 42 minutes   Total Time 42 minutes        Assessment   PT spent extensive time assisting pt with completion of FOTO survey due to limited [place] proficiency. Pt tol to exercise today and ability to achieve core contraction. Pt with limited recurrence of symptoms but sig fatigue following exercise. She will cont to benefit from skilled PT services to improve core stability and LE strengthening to improve global quality of life.   Plan   F/u HEP  Progress LE strength: total gym, bridge, SLR  Reassess core control       Goals    Goal 1: Pt will be able to demo HEP with zero to min cuing with good form to allow for independent performance at home and return to PLOF    Access Code: [facility]  URL: [email or web address]  Date: 10/08/2020  Prepared by: Ronny Flurry    Exercises  Single leg ab series/brace - 1 x daily - 7 x weekly - 4 reps - 15s hold  Sit to Stand with Arms Crossed - 1 x daily - 7 x weekly - 3 sets - 10 reps  Clamshell - 1 x daily - 7 x weekly - 3 sets - 12 reps     Sessions: 16      Goal 2: Improve FOTO Score to match or exceed predicted FOTO score of 51>/= to demonstrate improvement of functional ability with physical therapeutic intervention.         Sessions: 16      Goal 3: Patient will be able to perform 5 repeated extensions in standing without incidence of low back pain, or pain referred from the back into the LE to allow patient to stand more than 30 minutes to wash dishes and clean without pain and without excessive effort         Sessions: 16      Goal 4: Patient will improve SL stance time to 30  seconds to allow patient to negotiate stairs safely reciprocally                                            Ronny Flurry, DPT

## 2020-10-10 ENCOUNTER — Inpatient Hospital Stay: Payer: Medicare Other | Attending: Family | Admitting: Physical Therapist

## 2020-10-10 DIAGNOSIS — R2689 Other abnormalities of gait and mobility: Secondary | ICD-10-CM | POA: Insufficient documentation

## 2020-10-10 DIAGNOSIS — M5459 Other low back pain: Secondary | ICD-10-CM | POA: Insufficient documentation

## 2020-10-10 DIAGNOSIS — R531 Weakness: Secondary | ICD-10-CM | POA: Insufficient documentation

## 2020-10-10 NOTE — PT/OT Therapy Note (Signed)
Name: Dorothea Yow Age: 80 y.o.   Date of Service: 10/10/2020  Referring Physician: Eliezer Lofts, NP   Date of Injury: 09/11/2020  Date Care Plan Established/Reviewed: 09/27/2020  Date Treatment Started: 09/27/2020  End of Certification Date: 12/25/2020  Sessions in Plan of Care: 16  Surgery Date: No data was found  MD Follow-up: No data was found  Medbridge Code: No data was found    Visit Count: 3   Diagnosis:   1. Other abnormalities of gait and mobility    2. Decreased strength    3. Arthralgia of lumbar spine        Subjective     Daily Subjective   Patient reports that she still has pain in hips in the muscles and believes this could be muscle pain still has pain in the back     Social Support/Occupation  Lives in: multiple level home and community based residential facility  Lives with: adult children  Occupation: Retired      Precautions: No data was found  Allergies: Patient has no known allergies.               Treatment     Therapeutic Exercises - Justified to address any of the following: develop strength, endurance, ROM and/or flexibility.   Open books with TC for set up and decreased compensation of shoulder horizontal abduction 2x4 bilaterally     Hooklying bridge for glute strength and facilitation vs lumbar extensors. VC/TC given for PPT maintained throughout range to avoid lumbar extension compensation x12  - progressed to add RTB to increase glute strength 2x10    Total gym level 6 for LE strength with VC for set up and proper form 3x10    Neuromuscular Re-Education - Justified to address any of the following: of movement, balance, coordination, kinesthetic sense, posture and/or proprioception for sitting and/or standing activities.   Tandem stance balance 30s with SBA completed bilaterally   - progressed to add AirEx pad for increase challenge on uneven surface 30s x3       Manual Therapy - Justified to address any of the following:  Mobilization of joints and soft tissues, manipulation, manual  lymphatic drainage, and/or manual traction.    STM lumbar paraspinals     PA sacrococcygeal mob grade 4 with knee flexion FM   PA sacrum mob grade 4 with knee flexion FM   L/M mob coccyx grade III with knee flexion FM     PA grade III T4-9        ---    Flowsheet Row ---   Total Time    Timed Minutes 42 minutes   Total Time 42 minutes        Assessment   Pt progressing well with PT. She reported improving symptoms following MT and TE today. She cont to demo limitation in balance and LE strength required for sustained standing and will cont to benefit from skilled PT to improve the above mentioned and progress towards being able to cook meals without sig difficulty.  Plan   F/u HEP  Progress total gym  Balance obstacle course      Goals    Goal 1: Pt will be able to demo HEP with zero to min cuing with good form to allow for independent performance at home and return to PLOF    Access Code: [facility]  URL: [email or web address]  Date: 10/10/2020  Prepared by: Ronny Flurry    Exercises  Single leg ab series/brace - 1  x daily - 7 x weekly - 4 reps - 15s hold  Sit to Stand with Arms Crossed - 1 x daily - 7 x weekly - 3 sets - 10 reps  Clamshell - 1 x daily - 7 x weekly - 3 sets - 12 reps  Supine Bridge with Resistance Band - 1 x daily - 7 x weekly - 3 sets - 10 reps  Standing Tandem Balance with Counter Support - 1 x daily - 7 x weekly - 5 reps - 30s hold     Sessions: 16      Goal 2: Improve FOTO Score to match or exceed predicted FOTO score of 51>/= to demonstrate improvement of functional ability with physical therapeutic intervention.         Sessions: 16      Goal 3: Patient will be able to perform 5 repeated extensions in standing without incidence of low back pain, or pain referred from the back into the LE to allow patient to stand more than 30 minutes to wash dishes and clean without pain and without excessive effort         Sessions: 16      Goal 4: Patient will improve SL stance time to 30 seconds  to allow patient to negotiate stairs safely reciprocally                                            Ronny Flurry, DPT

## 2020-10-15 ENCOUNTER — Inpatient Hospital Stay: Payer: Medicare Other | Attending: Family | Admitting: Physical Therapist

## 2020-10-15 DIAGNOSIS — R531 Weakness: Secondary | ICD-10-CM | POA: Insufficient documentation

## 2020-10-15 DIAGNOSIS — M5459 Other low back pain: Secondary | ICD-10-CM | POA: Insufficient documentation

## 2020-10-15 DIAGNOSIS — R2689 Other abnormalities of gait and mobility: Secondary | ICD-10-CM | POA: Insufficient documentation

## 2020-10-15 NOTE — PT/OT Therapy Note (Signed)
Name: Michele [name] Age: 80 y.o.   Date of Service: 10/15/2020  Referring Physician: Eliezer Lofts, NP   Date of Injury: 09/11/2020  Date Care Plan Established/Reviewed: 09/27/2020  Date Treatment Started: 09/27/2020  End of Certification Date: 12/25/2020  Sessions in Plan of Care: 16  Surgery Date: No data was found  MD Follow-up: No data was found  Medbridge Code: No data was found    Visit Count: 4   Diagnosis:   1. Other abnormalities of gait and mobility    2. Decreased strength    3. Arthralgia of lumbar spine        Subjective     Daily Subjective   Patient reports she does not feel much better. The front of her leg and her hip hurts her on the L side. She reports her back is feeling a little bit better.     Social Support/Occupation  Lives in: multiple level home and community based residential facility  Lives with: adult children  Occupation: Retired      Precautions: No data was found  Allergies: Patient has no known allergies.               Treatment     Therapeutic Exercises - Justified to address any of the following: develop strength, endurance, ROM and/or flexibility.   Initiated treatment on RCB level 2 seat at 3 for increased blood flow and AROM while subjective history was taken.     Total gym level 7 SL with VC for proper knee alignment 3x12 bilaterally     Neuromuscular Re-Education - Justified to address any of the following: of movement, balance, coordination, kinesthetic sense, posture and/or proprioception for sitting and/or standing activities.   Attempted bird dog with pt reports of limited core activation and with difficulty maintaining WB on UE with sig tremors    Reviewed ab series with VC for set up  - pt with difficulty understanding core activation and reported limited muscular activation despite repetition    Supine TA bracing with TC/VC for activation repetition until able to initiate automatically  - progressed to heel slides 2x20 with pt monitoring TA bracing  - repeated bird  dog with TC indication of TA activation 2x10     Therapeutic Activity - Justified to address the following: activities to improve functional performance.  Step ups on AirEx pad with high knee for improving SL balance and maintain control for stair negotiation 3x12       ---    Flowsheet Row ---   Total Time    Timed Minutes 39 minutes   Total Time 39 minutes        Assessment   PT cont to progress with LE strengthening and spent extensive time reviewing core/TA activation. Pt with appropriate hooklying activation with automatic initiation felt and maintained with heel slides but difficulty in quadruped feeling core. She will cont to benefit from skilled PT services to improve core strength, balance, and increase functional ability to ambulate without increase in LBP and hip pain.   Plan   Core strength   Functional strength  Balance uneven surface      Goals    Goal 1: Pt will be able to demo HEP with zero to min cuing with good form to allow for independent performance at home and return to PLOF    Access Code: [facility]  URL: [email or web address]  Date: 10/15/2020  Prepared by: Ronny Flurry    Exercises  Sit to Stand with  Arms Crossed - 1 x daily - 7 x weekly - 3 sets - 10 reps  Clamshell - 1 x daily - 7 x weekly - 3 sets - 12 reps  Supine Bridge with Resistance Band - 1 x daily - 7 x weekly - 3 sets - 10 reps  Standing Tandem Balance with Counter Support - 1 x daily - 7 x weekly - 5 reps - 30s hold     Sessions: 16      Goal 2: Improve FOTO Score to match or exceed predicted FOTO score of 51>/= to demonstrate improvement of functional ability with physical therapeutic intervention.         Sessions: 16      Goal 3: Patient will be able to perform 5 repeated extensions in standing without incidence of low back pain, or pain referred from the back into the LE to allow patient to stand more than 30 minutes to wash dishes and clean without pain and without excessive effort         Sessions: 16      Goal  4: Patient will improve SL stance time to 30 seconds to allow patient to negotiate stairs safely reciprocally                                            Ronny Flurry, DPT

## 2020-10-16 ENCOUNTER — Encounter (INDEPENDENT_AMBULATORY_CARE_PROVIDER_SITE_OTHER): Payer: Self-pay | Admitting: Family

## 2020-10-16 ENCOUNTER — Ambulatory Visit (INDEPENDENT_AMBULATORY_CARE_PROVIDER_SITE_OTHER): Payer: Medicare Other | Admitting: Family

## 2020-10-16 ENCOUNTER — Ambulatory Visit
Admission: RE | Admit: 2020-10-16 | Discharge: 2020-10-16 | Disposition: A | Discharge status: Home / Self Care | Payer: Medicare Other | Source: Ambulatory Visit | Attending: Family | Admitting: Family

## 2020-10-16 VITALS — BP 106/55 | HR 74 | Temp 99.0°F | Resp 12 | Ht 59.06 in | Wt 97.4 lb

## 2020-10-16 DIAGNOSIS — R0781 Pleurodynia: Secondary | ICD-10-CM | POA: Insufficient documentation

## 2020-10-16 NOTE — Progress Notes (Signed)
Have you seen any specialists/other providers since your last visit with us?    No    Arm preference verified?   Yes, RIGHT ARM    Health Maintenance Due   Topic Date Due   . Advance Directive on File  Never done   . Spirometry Annual  Never done   . COVID-19 Vaccine (1) Never done   . Pneumonia Vaccine Age 81+ (1 of 1 - PPSV23) 11/25/2016   . Medicare Annual Wellness Visit  04/22/2019

## 2020-10-16 NOTE — Progress Notes (Signed)
Subjective:      Patient ID: Michele [name] is a 80 y.o. female     Chief Complaint   Patient presents with    Left side rib under breast      Intermittent chronic        HPI   Patient is complaining of sharp shooting pain on the left side of her rib cage.  This is recurrent and she does not take any medications for pain.  Patient states that it feels like a pinching, does not last very long.     The following sections were reviewed this encounter by the provider:   Tobacco   Allergies   Meds   Problems   Med Hx   Surg Hx   Fam Hx            Review of Systems   Constitutional: Negative for chills, fatigue and fever.   HENT: Negative for congestion.    Respiratory: Negative for cough, chest tightness, shortness of breath and wheezing.    Cardiovascular: Negative for chest pain, palpitations and leg swelling.   Gastrointestinal: Negative for diarrhea, nausea and vomiting.   Musculoskeletal: Negative for arthralgias.   Neurological: Negative for dizziness, seizures, speech difficulty, numbness and headaches.          BP 106/55 (BP Site: Right arm, Patient Position: Sitting, Cuff Size: Small)    Pulse 74    Temp 99 F (37.2 C) (Tympanic)    Resp 12    Ht 1.5 m (4' 11.06")    Wt (!) 44.2 kg (97 lb 7.1 oz)    BMI 19.64 kg/m     Objective:     Physical Exam  Vitals and nursing note reviewed.   Constitutional:       Appearance: Normal appearance. She is well-developed.   HENT:      Head: Normocephalic and atraumatic.   Cardiovascular:      Rate and Rhythm: Normal rate and regular rhythm.      Heart sounds: Normal heart sounds.   Pulmonary:      Effort: Pulmonary effort is normal.      Breath sounds: Normal breath sounds.   Chest:      Chest wall: Tenderness present. No mass, lacerations, deformity or swelling.       Skin:     General: Skin is warm and dry.   Neurological:      Mental Status: She is alert and oriented to person, place, and time.   Psychiatric:         Behavior: Behavior normal.         Thought Content:  Thought content normal.         Judgment: Judgment normal.          Assessment:     1. Rib pain on left side  - XR Ribs Left W PA Chest; Future        Plan:     Recurrent stable  X-rays pending  Patient continues to smoke cigarettes.  Previous chest x-rays and right rib x-ray were normal.      Medication list reviewed with patient and updated as indicated.  Risk & Benefits of any new medication(s) were explained to the patient who verbalized understanding & agreed to the treatment plan.  All questions are answered.Patient verbalized understanding of instructions given.      Windie Marasco Berna Spare, NP

## 2020-10-18 ENCOUNTER — Inpatient Hospital Stay: Payer: Medicare Other | Attending: Family | Admitting: Physical Therapist

## 2020-10-18 DIAGNOSIS — M5459 Other low back pain: Secondary | ICD-10-CM | POA: Insufficient documentation

## 2020-10-18 DIAGNOSIS — R531 Weakness: Secondary | ICD-10-CM | POA: Insufficient documentation

## 2020-10-18 DIAGNOSIS — R2689 Other abnormalities of gait and mobility: Secondary | ICD-10-CM | POA: Insufficient documentation

## 2020-10-18 NOTE — PT/OT Therapy Note (Signed)
Name: Michele [name] Age: 80 y.o.   Date of Service: 10/18/2020  Referring Physician: Eliezer Lofts, NP   Date of Injury: 09/11/2020  Date Care Plan Established/Reviewed: 10/18/2020  Date Treatment Started: 09/27/2020  End of Certification Date: 01/15/2021  Sessions in Plan of Care: 10  Surgery Date: No data was found  MD Follow-up: No data was found  Medbridge Code: No data was found    Visit Count: 5   Diagnosis:   1. Other abnormalities of gait and mobility    2. Decreased strength    3. Arthralgia of lumbar spine        Subjective     Daily Subjective   Patient reports that she is feeling better. Not much, but feeling a little better.     Social Support/Occupation  Lives in: multiple level home and community based residential facility  Lives with: adult children  Occupation: Retired      Precautions: No data was found  Allergies: Patient has no known allergies.    Objective     Active Range of Motion (degrees)     Additional Active Range of Motion Details  x5 repeated extension with pain only after 3 while moving    SL balance  R:  Trial 1: UE support 30s  Trial 2: without UE support 5s  Trial 3: 16s    L:  Trial 1: UE support 30s  Trial 2: without UE support 30s with sig postural sway  Trial 3:35s with sig postural sway            Treatment     Therapeutic Exercises - Justified to address any of the following: develop strength, endurance, ROM and/or flexibility.   ROM measures taken as stated above       Neuromuscular Re-Education - Justified to address any of the following: of movement, balance, coordination, kinesthetic sense, posture and/or proprioception for sitting and/or standing activities.   Balance measures taken as stated above     Hooklying TA contraction review with TC for abdominal activation. Progressed to pt monitoring with Supine marching x12  Heel slides 2x12     Progressed TA contraction to dead bugs with marching and irradiation from UE through stability bac 2x10      Tandem stance balance on  AirEx pad 30s with SBA completed bilaterally   - progressed to add head turns on airEx pad 2x30s       Therapeutic Activity - Justified to address the following: activities to improve functional performance.  STS with VC for hip hinging and decreased lumbar extensor activation with TA bracing x12  - progressed to add 8# DB in globlet position 3x10       ---    Flowsheet Row ---   Total Time    Timed Minutes 39 minutes   Total Time 39 minutes        Assessment   Patient has been seen for 5 visits in this case from 09/27/2020 until 10/18/2020. The primary encounter diagnosis was Other abnormalities of gait and mobility. Diagnoses of Decreased strength and Arthralgia of lumbar spine were also pertinent to this visit.  Patient has made fair progress in pain, range of motion, strength, functional stability, motor control and static balance since the initial evaluation. Patient would benefit from continued skilled PT intervention to address these deficits and achieve the below listed goals.    PT focused heavily on improving core control in hooklying. Attempted progression towards dead bugs with irradiation but pt  with reports of increased tightness across LB. After max repetition and cuing pt able to attain TA contraction and maintain control with heel slides. Pt improving well with LE strength and balance. Cont to progress with the above mentioned to improve standing and ambulation tol.  Plan   Continue with current frequency and plan of care  Anti-rotational core strength  Progress glute med strength       Goals    Goal 1: Pt will be able to demo HEP with zero to min cuing with good form to allow for independent performance at home and return to PLOF    Access Code: [facility]  URL: [email or web address]  Date: 10/15/2020  Prepared by: Ronny Flurry    Exercises  Sit to Stand with Arms Crossed - 1 x daily - 7 x weekly - 3 sets - 10 reps  Clamshell - 1 x daily - 7 x weekly - 3 sets - 12 reps  Supine Bridge with  Resistance Band - 1 x daily - 7 x weekly - 3 sets - 10 reps  Standing Tandem Balance with Counter Support - 1 x daily - 7 x weekly - 5 reps - 30s hold     Sessions: 15      Goal 2: Improve FOTO Score to match or exceed predicted FOTO score of 51>/= to demonstrate improvement of functional ability with physical therapeutic intervention.    10/18/20 EB 51   Sessions: 15      Goal 3: Patient will be able to perform 5 repeated extensions in standing without incidence of low back pain, or pain referred from the back into the LE to allow patient to stand more than 30 minutes to wash dishes and clean without pain and without excessive effort    10/18/20 EB able to stand for 20 min. 3x extensive with improving pain    Sessions: 15      Goal 4: Patient will improve SL stance time to 30 seconds to allow patient to negotiate stairs safely reciprocally      10/18/20 EB improving with ascend reciprocal and descend intermittent reciprocal gait pattern. See balance times improving above          Sessions: 15                                Ronny Flurry, DPT

## 2020-10-18 NOTE — Progress Notes (Signed)
Name:Michele [name] Age: 80 y.o.   Date of Service: 10/18/2020  Referring Physician: Eliezer Lofts, NP   Date of Injury: 09/11/2020  Date Care Plan Established/Reviewed: 10/18/2020  Date Treatment Started: 09/27/2020  End of Certification Date: 01/15/2021  Sessions in Plan of Care: 10  Surgery Date: No data was found  MD Follow-up: No data was found  Medbridge Code: No data was found    Visit Count: 5   Diagnosis:   1. Other abnormalities of gait and mobility    2. Decreased strength    3. Arthralgia of lumbar spine        Subjective     History of Present Illness   Functional Limitations (PLOF): CLOF: able to stand for about 20 min with decreased shaking and improving ease/IF: Unable to standing without LE shaking; unable to stand for >15 min (previously has had shaking but increase instability in 2018 after knee fx)  CLOF: reports easier with stairs with reciprocal ascend intermittent descend with reciprocal gait pattern/IE: Unable to negotiate stairs with reciprocal gait pattern and without use of railing (previously had difficulty with stairs since 2018 due to instability in LE and feelings of shaking throughout LE)      Social Support/Occupation  Lives in: multiple level home and community based residential facility  Lives with: adult children  Occupation: Retired      Precautions: No data was found  Allergies: Patient has no known allergies.    Past Medical History:   Diagnosis Date    Abnormal EKG     Arthralgia     Barrett's esophagus     Cervical spinal stenosis     Colonic polyp     Depression     Disc disorder     Gastroesophageal reflux disease     Hypercholesterolemia     Lower back pain     Neck pain     Numbness and tingling of both legs 02/06/2015    Osteoarthritis     Osteopenia                         ---    Flowsheet Row ---   Total Time    Timed Minutes 39 minutes   Total Time 39 minutes           Subjective     Daily Subjective   Patient reports that she is feeling better. Not much,  but feeling a little better.       Objective     Active Range of Motion (degrees)     Additional Active Range of Motion Details  x5 repeated extension with pain only after 3 while moving    SL balance  R:  Trial 1: UE support 30s  Trial 2: without UE support 5s  Trial 3: 16s    L:  Trial 1: UE support 30s  Trial 2: without UE support 30s with sig postural sway  Trial 3:35s with sig postural sway  From Evaluation on 09/27/20   Active Range of Motion (degrees)     Lumbar   Extension: with pain  Flexion functional reach: Floor  Left lateral flexion functional reach: Distal Thigh with pain  Right lateral flexion functional reach: Distal Thigh with pain    Additional Active Range of Motion Details  5x lumbar extension with sig increase in p! Into bilateral glute region   Balance   Left   Eyes Open (Left)(sec)  Trial 1: 3 seconds     Trial 2: 5 seconds     Trial 3: 10 seconds     Average: 6 seconds  Right   Eyes Open (Right)(sec)      Trial 1: 5 seconds     Trial 2: 10 seconds     Trial 3: 20 seconds     Average: 11.67 seconds    Assessment   Patient has been seen for 5 visits in this case from 09/27/2020 until 10/18/2020. The primary encounter diagnosis was Other abnormalities of gait and mobility. Diagnoses of Decreased strength and Arthralgia of lumbar spine were also pertinent to this visit.  Patient has made fair progress in pain, range of motion, strength, functional stability, motor control and static balance since the initial evaluation. Patient would benefit from continued skilled PT intervention to address these deficits and achieve the below listed goals.    PT focused heavily on improving core control in hooklying. Attempted progression towards dead bugs with irradiation but pt with reports of increased tightness across LB. After max repetition and cuing pt able to attain TA contraction and maintain control with heel slides. Pt improving well with LE strength and balance. Cont to progress with the above  mentioned to improve standing and ambulation tol.  Pain located: lumbar spine, L LE    Impairments: IPTC Impairments: Impaired postural alignment  Decreased range of motion  Decreased strength  Decreased functional stability  Decreased joint mobility  Decreased coordination  Decreased static balance  Decreased dynamic balance  Functional Limitations (PLOF): CLOF: able to stand for about 20 min with decreased shaking and improving ease/IF: Unable to standing without LE shaking; unable to stand for >15 min (previously has had shaking but increase instability in 2018 after knee fx)  CLOF: reports easier with stairs with reciprocal ascend intermittent descend with reciprocal gait pattern/IE: Unable to negotiate stairs with reciprocal gait pattern and without use of railing (previously had difficulty with stairs since 2018 due to instability in LE and feelings of shaking throughout LE)    Plan   Visits per week: 2  Number of Sessions: 10  Direct One on One  16109: Therapeutic Exercise: To Develop Strength and Endurance, ROM and Flexibility  [id number]: Gait Training  60454: Neuromuscular Reeducation (Proprioceptive Neuromuscular Faciliation)  97140: Manual Therapy techniques (mobilization, manipulation, manual traction) (Grade I-V to lumbar spine, pelvic girdle, and regionally interdependent joints, soft tissue mobilization, instrument assisted soft tissue mobilization.)  97530: Therapeutic Activities: Dynamic activities to improve functional performance  Dry Needling  Supervised Modalities  97010: Thermal modalities: hot/cold packs  09811: Mechnical traction  97014: Electrical stimulation  Continue with current frequency and plan of care  Anti-rotational core strength  Progress glute med strength       Goals    Goal 1: Pt will be able to demo HEP with zero to min cuing with good form to allow for independent performance at home and return to PLOF    Access Code: [facility]  URL: [email or web address]  Date:  10/15/2020  Prepared by: Ronny Flurry    Exercises  Sit to Stand with Arms Crossed - 1 x daily - 7 x weekly - 3 sets - 10 reps  Clamshell - 1 x daily - 7 x weekly - 3 sets - 12 reps  Supine Bridge with Resistance Band - 1 x daily - 7 x weekly - 3 sets - 10 reps  Standing Tandem Balance with Counter Support - 1 x daily - 7  x weekly - 5 reps - 30s hold     Sessions: 15      Goal 2: Improve FOTO Score to match or exceed predicted FOTO score of 51>/= to demonstrate improvement of functional ability with physical therapeutic intervention.    10/18/20 EB 51   Sessions: 15      Goal 3: Patient will be able to perform 5 repeated extensions in standing without incidence of low back pain, or pain referred from the back into the LE to allow patient to stand more than 30 minutes to wash dishes and clean without pain and without excessive effort    10/18/20 EB able to stand for 20 min. 3x extensive with improving pain    Sessions: 15      Goal 4: Patient will improve SL stance time to 30 seconds to allow patient to negotiate stairs safely reciprocally      10/18/20 EB improving with ascend reciprocal and descend intermittent reciprocal gait pattern. See balance times improving above          Sessions: 15                                Ronny Flurry, DPT

## 2020-10-19 ENCOUNTER — Encounter (INDEPENDENT_AMBULATORY_CARE_PROVIDER_SITE_OTHER): Payer: Self-pay

## 2020-10-22 ENCOUNTER — Inpatient Hospital Stay: Payer: Medicare Other | Attending: Family | Admitting: Physical Therapist

## 2020-10-22 DIAGNOSIS — R2689 Other abnormalities of gait and mobility: Secondary | ICD-10-CM

## 2020-10-22 DIAGNOSIS — M5459 Other low back pain: Secondary | ICD-10-CM | POA: Insufficient documentation

## 2020-10-22 DIAGNOSIS — R531 Weakness: Secondary | ICD-10-CM

## 2020-10-22 NOTE — PT/OT Therapy Note (Signed)
Name: Michele [name] Age: 80 y.o.   Date of Service: 10/22/2020  Referring Physician: Eliezer Lofts, NP   Date of Injury: 09/11/2020  Date Care Plan Established/Reviewed: 10/18/2020  Date Treatment Started: 09/27/2020  End of Certification Date: 01/15/2021  Sessions in Plan of Care: 10  Surgery Date: No data was found  MD Follow-up: No data was found  Medbridge Code: No data was found    Visit Count: 6   Diagnosis:   1. Other abnormalities of gait and mobility    2. Decreased strength    3. Arthralgia of lumbar spine        Subjective     Daily Subjective   Patient reports no trouble with exercises. Reports she has good and bad days with her back pain.     Social Support/Occupation  Lives in: multiple level home and community based residential facility  Lives with: adult children  Occupation: Retired      Precautions: No data was found  Allergies: Patient has no known allergies.               Treatment     Therapeutic Exercises - Justified to address any of the following: develop strength, endurance, ROM and/or flexibility.   Initiated treatment on upright bike with CGA from PT for increased blood flow and AROM while subjective history was taken.     Reviewed set up of sidelying clamshell with VC for increased height and trunk alignment without compensation 3x12    Bridge walk out for hamstring eccentric control and strength 3x5     Total gym with VC for full drive through entire foot vs forefoot/heel only x12  - progressed to add RTB 2x12    Neuromuscular Re-Education - Justified to address any of the following: of movement, balance, coordination, kinesthetic sense, posture and/or proprioception for sitting and/or standing activities.   Reviewed TA contraction with heel slide  - progressed to add SLR lowering and control of lumbar stability 2x12 with max cuing via TC/VC       ---    Flowsheet Row ---   Total Time    Timed Minutes 40 minutes   Total Time 40 minutes        Assessment   PT spent time reviewing some  HEP due to reports of limited adherence. Pt able to demo exercises following VC with repetition without need for VC to perform with proper form. Cont to progress with LE strengthening and balance exercises to improve global function and improve standing and walking tol.   Plan   F/u HEP adherence  Balance  Progress core strength  Step overs      Goals    Goal 1: Pt will be able to demo HEP with zero to min cuing with good form to allow for independent performance at home and return to PLOF    Access Code: [facility]  URL: [email or web address]  Date: 10/15/2020  Prepared by: Ronny Flurry    Exercises  Sit to Stand with Arms Crossed - 1 x daily - 7 x weekly - 3 sets - 10 reps  Clamshell - 1 x daily - 7 x weekly - 3 sets - 12 reps  Supine Bridge with Resistance Band - 1 x daily - 7 x weekly - 3 sets - 10 reps  Standing Tandem Balance with Counter Support - 1 x daily - 7 x weekly - 5 reps - 30s hold     Sessions: 15  Goal 2: Improve FOTO Score to match or exceed predicted FOTO score of 51>/= to demonstrate improvement of functional ability with physical therapeutic intervention.    10/18/20 EB 51   Sessions: 15      Goal 3: Patient will be able to perform 5 repeated extensions in standing without incidence of low back pain, or pain referred from the back into the LE to allow patient to stand more than 30 minutes to wash dishes and clean without pain and without excessive effort    10/18/20 EB able to stand for 20 min. 3x extensive with improving pain    Sessions: 15      Goal 4: Patient will improve SL stance time to 30 seconds to allow patient to negotiate stairs safely reciprocally      10/18/20 EB improving with ascend reciprocal and descend intermittent reciprocal gait pattern. See balance times improving above          Sessions: 15                                Ronny Flurry, DPT

## 2020-10-24 ENCOUNTER — Inpatient Hospital Stay: Payer: Medicare Other | Admitting: Physical Therapist

## 2020-10-25 ENCOUNTER — Encounter (INDEPENDENT_AMBULATORY_CARE_PROVIDER_SITE_OTHER): Payer: Self-pay

## 2020-10-29 ENCOUNTER — Inpatient Hospital Stay: Payer: Medicare Other | Admitting: Physical Therapist

## 2020-10-31 ENCOUNTER — Telehealth: Payer: Self-pay | Admitting: Physical Therapist

## 2020-10-31 ENCOUNTER — Inpatient Hospital Stay: Payer: Medicare Other | Admitting: Physical Therapist

## 2020-11-05 ENCOUNTER — Inpatient Hospital Stay: Payer: Medicare Other | Attending: Family | Admitting: Physical Therapist

## 2020-11-05 DIAGNOSIS — M5459 Other low back pain: Secondary | ICD-10-CM

## 2020-11-05 DIAGNOSIS — R2689 Other abnormalities of gait and mobility: Secondary | ICD-10-CM | POA: Insufficient documentation

## 2020-11-05 DIAGNOSIS — R531 Weakness: Secondary | ICD-10-CM | POA: Insufficient documentation

## 2020-11-05 NOTE — PT/OT Therapy Note (Signed)
Name: Michele [name] Age: 80 y.o.   Date of Service: 11/05/2020  Referring Physician: Eliezer Lofts, NP   Date of Injury: 09/11/2020  Date Care Plan Established/Reviewed: 10/18/2020  Date Treatment Started: 09/27/2020  End of Certification Date: 01/15/2021  Sessions in Plan of Care: 10  Surgery Date: No data was found  MD Follow-up: No data was found  Medbridge Code: No data was found    Visit Count: 7   Diagnosis:   1. Other abnormalities of gait and mobility    2. Decreased strength    3. Arthralgia of lumbar spine        Subjective     Daily Subjective   Patient reports that her L hip feels like it is not strong and it is what kills her. It has some pain. Shakes because it is not strong. Doing her exercises. She is doing them slowly because she is too busy.    Social Support/Occupation  Lives in: multiple level home and community based residential facility  Lives with: adult children  Occupation: Retired      Precautions: No data was found  Allergies: Patient has no known allergies.               Treatment     Therapeutic Exercises - Justified to address any of the following: develop strength, endurance, ROM and/or flexibility.   Supine piriformis stretch with VC for set up and proper form with symptom reproduction hold 30s x3    Figure 4 stretch with VC for set up and proper form hold 30s x3      Sidelying clam shell with VC for set up and maintaining neutral trunk alignment x12 bilaterally  - progressed to add RTB 2x10     Neuromuscular Re-Education - Justified to address any of the following: of movement, balance, coordination, kinesthetic sense, posture and/or proprioception for sitting and/or standing activities.   Hip bridge with isometric adductor contraction 3x12 for improving pelvic floor contraction and increased stability in hips    Tandem balance AirEx pad without UE support 30s  - progressed to add head turns with finger tip support on AirEx pad 30s     Manual Therapy - Justified to address any of the  following:  Mobilization of joints and soft tissues, manipulation, manual lymphatic drainage, and/or manual traction.    PA sacrococcygeal mob grade 4 with knee flexion FM   PA sacrum mob grade 4 with knee flexion FM   Inferior mob of sacrum with knee flexion grade III  STM to glute med, glute min, piriformis        ---    Flowsheet Row ---   Total Time    Timed Minutes 43 minutes   Total Time 43 minutes        Assessment   Patient presents to PT with reports of cont pain in L buttock. She had TTP of piriformis and reported tightness and slight discomfort performing supine piriformis stretch. Pt reports improvement in gluteal pain but reports cont feeling of weakness surrounding her lateral hip into anterior thigh. She was able to complete all exercises well but reports limited adherence to HEP due to time constraint. Pt was advised on importance of HEP for progression of PT. Cont to progress with hip abduction and ER strength and increased stability at lumbar spine and pelvic girdle.   Plan   FOTO with bicycle  Reassess piriformis  Total gym  Balance on uneven surface  Goals    Goal 1: Pt will be able to demo HEP with zero to min cuing with good form to allow for independent performance at home and return to PLOF    Access Code: [facility]  URL: [email or web address]  Date: 11/05/2020  Prepared by: Ronny Flurry    Exercises  Sit to Stand with Arms Crossed - 1 x daily - 7 x weekly - 3 sets - 10 reps  Supine Bridge with Resistance Band - 1 x daily - 7 x weekly - 3 sets - 10 reps  Standing Tandem Balance with Counter Support - 1 x daily - 7 x weekly - 5 reps - 30s hold  Supine Transversus Abdominis Bracing with Heel Slide - 1 x daily - 7 x weekly - 3 sets - 10 reps  Supine Figure 4 Piriformis Stretch - 1 x daily - 7 x weekly - 3 reps - 30s hold  Clamshell with Resistance - 1 x daily - 7 x weekly - 3 sets - 10 reps     Sessions: 15      Goal 2: Improve FOTO Score to match or exceed predicted FOTO score  of 51>/= to demonstrate improvement of functional ability with physical therapeutic intervention.    10/18/20 EB 51   Sessions: 15      Goal 3: Patient will be able to perform 5 repeated extensions in standing without incidence of low back pain, or pain referred from the back into the LE to allow patient to stand more than 30 minutes to wash dishes and clean without pain and without excessive effort    10/18/20 EB able to stand for 20 min. 3x extensive with improving pain    Sessions: 15      Goal 4: Patient will improve SL stance time to 30 seconds to allow patient to negotiate stairs safely reciprocally      10/18/20 EB improving with ascend reciprocal and descend intermittent reciprocal gait pattern. See balance times improving above          Sessions: 15                                Ronny Flurry, DPT

## 2020-11-07 ENCOUNTER — Inpatient Hospital Stay: Payer: Medicare Other | Admitting: Physical Therapist

## 2020-11-09 ENCOUNTER — Inpatient Hospital Stay: Payer: Medicare Other | Attending: Family | Admitting: Physical Therapist

## 2020-11-09 DIAGNOSIS — R2689 Other abnormalities of gait and mobility: Secondary | ICD-10-CM | POA: Insufficient documentation

## 2020-11-09 DIAGNOSIS — R531 Weakness: Secondary | ICD-10-CM | POA: Insufficient documentation

## 2020-11-09 DIAGNOSIS — M5459 Other low back pain: Secondary | ICD-10-CM | POA: Insufficient documentation

## 2020-11-09 NOTE — PT/OT Therapy Note (Addendum)
Name: Michele [name] Age: 80 y.o.   Date of Service: 11/09/2020  Referring Physician: Eliezer Lofts, NP   Date of Injury: 09/11/2020  Date Care Plan Established/Reviewed: 10/18/2020  Date Treatment Started: 09/27/2020  End of Certification Date: 01/15/2021  Sessions in Plan of Care: 10  Surgery Date: No data was found  MD Follow-up: No data was found  Medbridge Code: No data was found    Visit Count: 8   Diagnosis:   1. Other abnormalities of gait and mobility    2. Decreased strength    3. Arthralgia of lumbar spine      Discontinuation of Therapy Services    Michele [name] did not complete prescribed physical therapy visits.      Status is unknown at this time, physical therapy has been discontinued and patient has been discharged from care.  The last therapy note is below for review.    Please feel free to contact me with any questions regarding the care of [facility].    Sincerely,    Ronny Flurry, DPT        01/04/2021            Subjective     Daily Subjective   Pt reports that she feels like she has no changes in her leg pains and her shaking. She feels like she has improved with her LBP slightly     Social Support/Occupation  Lives in: multiple level home and community based residential facility  Lives with: adult children  Occupation: Retired      Precautions: No data was found  Allergies: Patient has no known allergies.    Objective     Active Range of Motion (degrees)     Additional Active Range of Motion Details  Repeated extension sig increase in pain    Repeated flexion, no change in pain     Tests     Left Hip   Negative FABER and FADIR.     Right Hip   Negative FABER and FADIR.             Treatment     Therapeutic Exercises - Justified to address any of the following: develop strength, endurance, ROM and/or flexibility.   Knee to chest stretch hold 30s x4 with VC for hold and maintaining hold     Extensive discussion and education on returning to MD for further medical management and discussion on  neural symptoms throughout LE    Neuromuscular Re-Education - Justified to address any of the following: of movement, balance, coordination, kinesthetic sense, posture and/or proprioception for sitting and/or standing activities.   Extensive review of bridge with appropriate activation of glutes vs over activation of lumbar spinal extensors, hamstrings, and quads. Repetition with pt hand on glute to facilitate proprioceptive awareness of glute activation repetition until proper form without cues      Manual Therapy - Justified to address any of the following:  Mobilization of joints and soft tissues, manipulation, manual lymphatic drainage, and/or manual traction.    STM to piriformis bilaterally   PA grade III T5-12  PA grade III L1-5  Inferior mob of sacrum grade III (cavitation felt without high grade velocity or grade motion   Pa grade III sacrum       ---      Flowsheet Row ---   Total Time    Timed Minutes 43 minutes   Total Time 43 minutes          Assessment  PT spent extensive time on assessment of pt symptoms and ruling out any further hip pathology. PT unable to elicits symptoms from hips, however, sig increase in pain with repeated extension consistent with findings in eval. Pt referred to MD for further medical management and follow up of symptoms. If pt to return cont to progress with balance and lumbar mobility and improved core activation for increased activity tol.   Plan   F/u MD  Core strength  Balance       Goals      Goal 1: Pt will be able to demo HEP with zero to min cuing with good form to allow for independent performance at home and return to PLOF    Access Code: [facility]  URL: [email or web address]  Date: 11/09/2020  Prepared by: Ronny Flurry    Exercises  Sit to Stand with Arms Crossed - 1 x daily - 7 x weekly - 3 sets - 10 reps  Standing Tandem Balance with Counter Support - 1 x daily - 7 x weekly - 5 reps - 30s hold  Supine Transversus Abdominis Bracing with Heel Slide - 1  x daily - 7 x weekly - 3 sets - 10 reps  Supine Figure 4 Piriformis Stretch - 1 x daily - 7 x weekly - 3 reps - 30s hold  Clamshell with Resistance - 1 x daily - 7 x weekly - 3 sets - 10 reps  Supine Bridge - 1 x daily - 7 x weekly - 3 sets - 10 reps       Sessions: 15      Goal 2: Improve FOTO Score to match or exceed predicted FOTO score of 51>/= to demonstrate improvement of functional ability with physical therapeutic intervention.    10/18/20 EB 51   Sessions: 15      Goal 3: Patient will be able to perform 5 repeated extensions in standing without incidence of low back pain, or pain referred from the back into the LE to allow patient to stand more than 30 minutes to wash dishes and clean without pain and without excessive effort    5/12/09/20 EB cont to have sig pain in back with repeated extension and limited standing tol  10/18/20 EB able to stand for 20 min. 3x extensive with improving pain    Sessions: 15      Goal 4: Patient will improve SL stance time to 30 seconds to allow patient to negotiate stairs safely reciprocally      10/18/20 EB improving with ascend reciprocal and descend intermittent reciprocal gait pattern. See balance times improving above          Sessions: 15                                  Ronny Flurry, DPT

## 2020-11-23 ENCOUNTER — Other Ambulatory Visit (INDEPENDENT_AMBULATORY_CARE_PROVIDER_SITE_OTHER): Payer: Self-pay | Admitting: Family

## 2020-11-23 ENCOUNTER — Encounter (INDEPENDENT_AMBULATORY_CARE_PROVIDER_SITE_OTHER): Payer: Self-pay | Admitting: Family

## 2020-11-23 ENCOUNTER — Ambulatory Visit (INDEPENDENT_AMBULATORY_CARE_PROVIDER_SITE_OTHER): Payer: Medicare Other | Admitting: Family

## 2020-11-23 ENCOUNTER — Ambulatory Visit
Admission: RE | Admit: 2020-11-23 | Discharge: 2020-11-23 | Disposition: A | Discharge status: Home / Self Care | Payer: Medicare Other | Source: Ambulatory Visit | Attending: Family | Admitting: Family

## 2020-11-23 VITALS — BP 109/60 | HR 90 | Temp 98.8°F | Resp 16 | Ht 58.19 in | Wt 96.8 lb

## 2020-11-23 DIAGNOSIS — G8929 Other chronic pain: Secondary | ICD-10-CM | POA: Insufficient documentation

## 2020-11-23 DIAGNOSIS — R636 Underweight: Secondary | ICD-10-CM

## 2020-11-23 DIAGNOSIS — F419 Anxiety disorder, unspecified: Secondary | ICD-10-CM | POA: Insufficient documentation

## 2020-11-23 DIAGNOSIS — M25552 Pain in left hip: Secondary | ICD-10-CM | POA: Insufficient documentation

## 2020-11-23 DIAGNOSIS — R251 Tremor, unspecified: Secondary | ICD-10-CM

## 2020-11-23 DIAGNOSIS — F32A Depression, unspecified: Secondary | ICD-10-CM

## 2020-11-23 MED ORDER — ESCITALOPRAM OXALATE 10 MG PO TABS
ORAL_TABLET | ORAL | 0 refills | Status: DC
Start: 2020-11-23 — End: 2020-12-24

## 2020-11-23 NOTE — Progress Notes (Signed)
Subjective:      Patient ID: Michele [name] is a 80 y.o. female     Chief Complaint   Patient presents with   . Continued left side rib pain        Anxiety  Presents for initial visit. The problem has been unchanged. Symptoms include depressed mood, excessive worry, irritability, muscle tension and nervous/anxious behavior. Patient reports no chest pain, dizziness, nausea, palpitations, shortness of breath or suicidal ideas. Symptoms occur constantly. The symptoms are aggravated by family issues. The quality of sleep is good.     Her past medical history is significant for depression. Past treatments include nothing.   Depression  Visit Type: initial  Progression since onset: gradually worsening  Patient presents with the following symptoms: depressed mood, excessive worry, irritability, muscle tension and nervousness/anxiety.  Patient is not experiencing: dizziness, palpitations, shortness of breath and suicidal ideas.  Aggravated by: family issues  Sleep quality: good  No history of: suicide attempt and substance abuse  Treatment tried: lifestyle changes         Problem   Anxiety and Depression    PHQ-9 19  GAD-7 15  Pt 's son states that she has a lot stress in her life d/t family issues  Trail of lexapro 10 mg  Follow up in 4 weeks     Chronic Left Hip Pain       Pt's son joinig via phone call to provide some history  And background information.  Husband passed away 4 years ago.Pt is "aged significantly" last 4 years and increased stress in her life due to family problems. Pt admits to decreased appetite, and not gaining any weight. Son states that when she feels stress out she smokes more.      The following sections were reviewed this encounter by the provider:   Tobacco  Allergies  Meds  Problems  Med Hx  Surg Hx  Fam Hx           Review of Systems   Constitutional: Positive for irritability. Negative for chills, fatigue and fever.   HENT: Negative for congestion.    Respiratory: Negative for cough,  chest tightness, shortness of breath and wheezing.    Cardiovascular: Negative for chest pain, palpitations and leg swelling.   Gastrointestinal: Negative for diarrhea, nausea and vomiting.   Musculoskeletal: Negative for arthralgias.   Neurological: Positive for tremors. Negative for dizziness, seizures, speech difficulty, numbness and headaches.   Psychiatric/Behavioral: Negative for substance abuse and suicidal ideas. The patient is nervous/anxious.           BP 109/60 (BP Site: Right arm, Patient Position: Sitting, Cuff Size: Small)   Pulse 90   Temp 98.8 F (37.1 C) (Tympanic)   Resp 16   Ht 1.478 m (4' 10.19")   Wt (!) 43.9 kg (96 lb 12.5 oz)   BMI 20.10 kg/m     Objective:     Physical Exam  Vitals and nursing note reviewed.   Constitutional:       Appearance: Normal appearance. She is well-developed.      Comments: Thin frail apperance   HENT:      Head: Normocephalic and atraumatic.   Cardiovascular:      Rate and Rhythm: Normal rate and regular rhythm.      Heart sounds: Normal heart sounds.   Pulmonary:      Effort: Pulmonary effort is normal.      Breath sounds: Normal breath sounds.   Skin:  General: Skin is warm and dry.   Neurological:      Mental Status: She is alert and oriented to person, place, and time.   Psychiatric:         Behavior: Behavior normal.         Thought Content: Thought content normal.         Judgment: Judgment normal.          Assessment:     1. Anxiety and depression  - escitalopram (Lexapro) 10 MG tablet; Take half tablet 5 mg first week, then increase to 10 mg daily  Dispense: 30 tablet; Refill: 0    2. Chronic left hip pain  - XR Hip left 1 vw with pelvis; Future        Plan:     1) Chronic, not well controlled  Trial of lexapro 10 mg  Follow up in 4 weeks    2) Chronic, stable  R/o OA  Will refer to ortho if necessary      Medication list reviewed with patient and updated as indicated.  Risk & Benefits of any new medication(s) were explained to the patient who  verbalized understanding & agreed to the treatment plan.  All questions are answered.Patient verbalized understanding of instructions given.    Tyvion Edmondson Berna Spare, NP

## 2020-11-23 NOTE — Progress Notes (Signed)
Have you seen any specialists/other providers since your last visit with [place]?    No    Arm preference verified?   Yes, RIGHT ARM    Health Maintenance Due   Topic Date Due   . Advance Directive on File  Never done   . Spirometry Annual  Never done   . COVID-19 Vaccine (1) Never done   . Pneumonia Vaccine Age 81+ (1 of 1 - PPSV23) 11/25/2016   . Medicare Annual Wellness Visit  04/22/2019

## 2020-11-24 ENCOUNTER — Inpatient Hospital Stay: Payer: Medicare Other | Admitting: Physical Therapist

## 2020-11-26 ENCOUNTER — Other Ambulatory Visit (FREE_STANDING_LABORATORY_FACILITY): Payer: Medicare Other

## 2020-11-26 ENCOUNTER — Other Ambulatory Visit: Payer: Medicare Other

## 2020-11-26 DIAGNOSIS — R636 Underweight: Secondary | ICD-10-CM

## 2020-11-26 DIAGNOSIS — R251 Tremor, unspecified: Secondary | ICD-10-CM

## 2020-11-26 LAB — CORTISOL: Cortisol: 9 ug/dL

## 2020-11-26 LAB — TSH: TSH: 2.85 u[IU]/mL (ref 0.35–4.94)

## 2020-11-26 LAB — T4, FREE: T4 Free: 0.89 ng/dL (ref 0.70–1.48)

## 2020-11-27 LAB — ACTH: Adrenocorticotropic hormone (ACTH): 35 pg/mL

## 2020-12-09 ENCOUNTER — Other Ambulatory Visit (INDEPENDENT_AMBULATORY_CARE_PROVIDER_SITE_OTHER): Payer: Self-pay | Admitting: Family

## 2020-12-09 DIAGNOSIS — E785 Hyperlipidemia, unspecified: Secondary | ICD-10-CM

## 2020-12-15 ENCOUNTER — Other Ambulatory Visit (INDEPENDENT_AMBULATORY_CARE_PROVIDER_SITE_OTHER): Payer: Self-pay | Admitting: Family

## 2020-12-15 DIAGNOSIS — F32A Depression, unspecified: Secondary | ICD-10-CM

## 2020-12-15 DIAGNOSIS — F419 Anxiety disorder, unspecified: Secondary | ICD-10-CM

## 2020-12-24 ENCOUNTER — Encounter (INDEPENDENT_AMBULATORY_CARE_PROVIDER_SITE_OTHER): Payer: Self-pay | Admitting: Family

## 2020-12-24 ENCOUNTER — Ambulatory Visit (INDEPENDENT_AMBULATORY_CARE_PROVIDER_SITE_OTHER): Payer: Medicare Other | Admitting: Family

## 2020-12-24 VITALS — BP 134/66 | HR 80 | Resp 20 | Ht 58.58 in | Wt 97.2 lb

## 2020-12-24 DIAGNOSIS — G8929 Other chronic pain: Secondary | ICD-10-CM

## 2020-12-24 DIAGNOSIS — M542 Cervicalgia: Secondary | ICD-10-CM

## 2020-12-24 DIAGNOSIS — M25552 Pain in left hip: Secondary | ICD-10-CM

## 2020-12-24 MED ORDER — MELOXICAM 15 MG PO TABS
15.0000 mg | ORAL_TABLET | Freq: Every day | ORAL | 0 refills | Status: AC
Start: 2020-12-24 — End: 2021-01-23

## 2020-12-24 NOTE — Progress Notes (Signed)
Subjective:      Patient ID: Michele [name] is a 80 y.o. female     Chief Complaint   Patient presents with    Tremors    Hip Pain     Chronic left hip pain, on/off, sharp shooting pain    neck pressure     Posterior neck, on/off           Wt Readings from Last 3 Encounters:   12/24/20 (!) 44.1 kg (97 lb 3.6 oz)   11/23/20 (!) 43.9 kg (96 lb 12.5 oz)   10/16/20 (!) 44.2 kg (97 lb 7.1 oz)       Problem   Chronic Left Hip Pain    Last xray from 5/22, showed mild joint space narrowing  Episodic pain, feel like harp shooting pain            The following sections were reviewed this encounter by the provider:   Tobacco  Allergies  Meds  Problems  Med Hx  Surg Hx  Fam Hx           Review of Systems   Constitutional:  Negative for chills, fatigue and fever.   HENT:  Negative for congestion.    Respiratory:  Negative for cough, chest tightness, shortness of breath and wheezing.    Cardiovascular:  Negative for chest pain, palpitations and leg swelling.   Gastrointestinal:  Negative for diarrhea, nausea and vomiting.   Musculoskeletal:  Positive for arthralgias and neck pain (feels like pressure). Negative for neck stiffness.   Neurological:  Positive for tremors and weakness. Negative for dizziness, seizures, syncope, speech difficulty, numbness and headaches.        BP 134/66 (BP Site: Right arm, Patient Position: Sitting, Cuff Size: X-Small)   Pulse 80   Resp 20   Ht 1.488 m (4' 10.58")   Wt (!) 44.1 kg (97 lb 3.6 oz)   BMI 19.92 kg/m     Objective:     Physical Exam  Vitals and nursing note reviewed.   Constitutional:       Appearance: Normal appearance. She is well-developed.      Comments: Frail appearance   HENT:      Head: Normocephalic and atraumatic.   Neck:     Cardiovascular:      Rate and Rhythm: Normal rate and regular rhythm.      Heart sounds: Normal heart sounds.   Pulmonary:      Effort: Pulmonary effort is normal.      Breath sounds: Normal breath sounds.   Musculoskeletal:      Cervical back:  Muscular tenderness present. No pain with movement or spinous process tenderness.   Skin:     General: Skin is warm and dry.   Neurological:      General: No focal deficit present.      Mental Status: She is alert and oriented to person, place, and time.   Psychiatric:         Mood and Affect: Mood normal.         Behavior: Behavior normal.         Thought Content: Thought content normal.         Judgment: Judgment normal.        Assessment:     1. Chronic left hip pain  - meloxicam (MOBIC) 15 MG tablet; Take 1 tablet (15 mg total) by mouth daily  Dispense: 30 tablet; Refill: 0    2. Bilateral posterior neck pain  -  meloxicam (MOBIC) 15 MG tablet; Take 1 tablet (15 mg total) by mouth daily  Dispense: 30 tablet; Refill: 0      Plan:   1,2) Chronic, stable  She was not able to complete PT  Trial of meloxicam 15 mg   If persists referral will be given to ortho      Medication list reviewed with patient and updated as indicated.  Risk & Benefits of any new medication(s) were explained to the patient who verbalized understanding & agreed to the treatment plan.  All questions are answered.Patient verbalized understanding of instructions given.        Yuriel Lopezmartinez Berna Spare, NP

## 2020-12-24 NOTE — Progress Notes (Signed)
Have you seen any specialists/other providers since your last visit with [place]?    No    Arm preference verified?   Yes, RIGHT ARM    Health Maintenance Due   Topic Date Due    Advance Directive on File  Never done    Spirometry Annual  Never done    COVID-19 Vaccine (1) Never done    Pneumonia Vaccine Age 81+ (2 - PPSV23 or PCV20) 11/25/2016    Medicare Annual Wellness Visit  04/22/2019

## 2021-06-03 ENCOUNTER — Encounter (INDEPENDENT_AMBULATORY_CARE_PROVIDER_SITE_OTHER): Payer: Self-pay | Admitting: Family

## 2021-06-03 ENCOUNTER — Ambulatory Visit (INDEPENDENT_AMBULATORY_CARE_PROVIDER_SITE_OTHER): Payer: Medicare Other | Admitting: Family

## 2021-06-03 VITALS — BP 95/61 | HR 80 | Temp 98.0°F | Ht <= 58 in | Wt 99.4 lb

## 2021-06-03 DIAGNOSIS — E785 Hyperlipidemia, unspecified: Secondary | ICD-10-CM

## 2021-06-03 DIAGNOSIS — G8929 Other chronic pain: Secondary | ICD-10-CM

## 2021-06-03 DIAGNOSIS — M25552 Pain in left hip: Secondary | ICD-10-CM

## 2021-06-03 DIAGNOSIS — M546 Pain in thoracic spine: Secondary | ICD-10-CM

## 2021-06-03 DIAGNOSIS — M25562 Pain in left knee: Secondary | ICD-10-CM

## 2021-06-03 MED ORDER — CYCLOBENZAPRINE HCL 5 MG PO TABS
ORAL_TABLET | ORAL | 0 refills | Status: DC
Start: 2021-06-03 — End: 2022-12-09

## 2021-06-03 NOTE — Progress Notes (Signed)
Subjective:      Patient ID: Michele [name] is a 80 y.o. female     Chief Complaint   Patient presents with    Hyperlipidemia     No fasting        Hyperlipidemia  The patient is being seen for a routine follow-up of hyperlipidemia.  There are no recent interval events.  There is no interval history of palpitations, myalgias, irregular heart beat and paroxysmal nocturnal dyspnea. Current therapy includes a statin and lifestyle changes.  Patient is compliant with the current regimen.      Also complaining of chronic left hip pain and knee pain.  Patient states that previously she got injection to her left knee, which helped significantly with her pain.  X-ray of left hip joint showed mild joint space narrowing without significant changes.  Problem   Hyperlipidemia      Lab Results   Component Value Date    CHOL 153 08/14/2020    CHOL 172 08/04/2019    CHOL 209 (H) 04/22/2018     Lab Results   Component Value Date    HDL 71 08/14/2020    HDL 68 08/04/2019    HDL 61 04/22/2018     Lab Results   Component Value Date    LDL 73 08/14/2020    LDL 91 08/04/2019    LDL 133 (H) 04/22/2018     Lab Results   Component Value Date    TRIG 44 08/14/2020    TRIG 64 08/04/2019    TRIG 77 04/22/2018     Currently on atorvastatin 10 mg          The following sections were reviewed this encounter by the provider:   Tobacco  Allergies  Meds  Problems  Med Hx  Surg Hx  Fam Hx         Review of Systems   Constitutional:  Negative for chills, fatigue and fever.   HENT:  Negative for congestion.    Respiratory:  Negative for cough, chest tightness, shortness of breath and wheezing.    Cardiovascular:  Negative for chest pain, palpitations and leg swelling.   Gastrointestinal:  Negative for diarrhea, nausea and vomiting.   Musculoskeletal:  Positive for arthralgias, gait problem and myalgias.   Neurological:  Positive for tremors (Gross full body tremors). Negative for dizziness, seizures, speech difficulty, numbness and headaches.    Psychiatric/Behavioral:  Negative for agitation.         BP 95/61   Pulse 80   Temp 98 F (36.7 C) (Tympanic)   Ht 1.473 m (4\' 10" )   Wt 45.1 kg (99 lb 6.4 oz)   BMI 20.77 kg/m     Objective:     Physical Exam  Vitals and nursing note reviewed.   Constitutional:       Appearance: Normal appearance. She is well-developed.      Comments: Frail appearance   HENT:      Head: Normocephalic and atraumatic.   Cardiovascular:      Rate and Rhythm: Normal rate and regular rhythm.      Heart sounds: Normal heart sounds.   Pulmonary:      Effort: Pulmonary effort is normal.      Breath sounds: Normal breath sounds.   Musculoskeletal:        Legs:    Skin:     General: Skin is warm and dry.   Neurological:      Mental Status: She is alert and  oriented to person, place, and time.   Psychiatric:         Behavior: Behavior normal.         Thought Content: Thought content normal.         Judgment: Judgment normal.        Assessment:     1. Hyperlipidemia, unspecified hyperlipidemia type  - Hepatic function panel (LFT); Future  - Lipid panel; Future    2. Chronic thoracic back pain, unspecified back pain laterality  - cyclobenzaprine (FLEXERIL) 5 MG tablet; 1/2 to 1 tablet as needed every 8 hours PRN muscle spasm. Caution: causes sedation  Dispense: 20 tablet; Refill: 0    3. Chronic left hip pain  - Ambulatory referral to Orthopedic Surgery    4. Chronic pain of left knee  - Ambulatory referral to Orthopedic Surgery      Plan:     1.  Chronic stable  Labs pending, compliant with atorvastatin 10 mg  2.  Trial of Flexeril 5 mg  Take it as prescribed  3 and 4.  Chronic, not well controlled  Referral given to Ortho      Medication list reviewed with patient and updated as indicated.  Risk & Benefits of any new medication(s) were explained to the patient who verbalized understanding & agreed to the treatment plan.  All questions are answered.Patient verbalized understanding of instructions given.      Jancy Sprankle Berna Spare, NP

## 2021-06-03 NOTE — Progress Notes (Signed)
Have you seen any specialists/other providers since your last visit with [place]?    No    Arm preference verified?   Yes, no preference    Health Maintenance Due   Topic Date Due    Advance Directive on File  Never done    Spirometry Annual  Never done    Tetanus Ten-Year  Never done    COVID-19 Vaccine (1) Never done    Pneumonia Vaccine Age 81+ (2 - PPSV23 if available, else PCV20) 11/25/2016    Medicare Annual Wellness Visit  04/22/2019

## 2021-06-04 ENCOUNTER — Other Ambulatory Visit: Payer: Medicare Other

## 2021-06-05 LAB — LIPID PANEL
Cholesterol / HDL Ratio: 2.3 ratio (ref 0.0–4.4)
Cholesterol: 162 mg/dL (ref 100–199)
HDL: 71 mg/dL (ref >39)
LDL Chol Calculated (NIH): 80 mg/dL (ref 0–99)
Triglycerides: 53 mg/dL (ref 0–149)
VLDL Calculated: 11 mg/dL (ref 5–40)

## 2021-06-05 LAB — HEPATIC FUNCTION PANEL
ALT: 17 IU/L (ref 0–32)
AST (SGOT): 22 IU/L (ref 0–40)
Albumin: 4.6 g/dL (ref 3.6–4.6)
Alkaline Phosphatase: 65 IU/L (ref 44–121)
Bilirubin Direct: 0.18 mg/dL (ref 0.00–0.40)
Bilirubin, Total: 0.6 mg/dL (ref 0.0–1.2)
Protein, Total: 6.7 g/dL (ref 6.0–8.5)

## 2021-06-15 ENCOUNTER — Other Ambulatory Visit (INDEPENDENT_AMBULATORY_CARE_PROVIDER_SITE_OTHER): Payer: Self-pay | Admitting: Family

## 2021-06-15 DIAGNOSIS — E785 Hyperlipidemia, unspecified: Secondary | ICD-10-CM

## 2021-06-17 NOTE — Telephone Encounter (Signed)
Medication: atorvastatin (LIPITOR) 10 MG tablet  Last filled: 12/09/2020 90 tablet 1 refill   Last Appointment: 06/03/2021  Last Labs: 06/04/2021  Next Appointment:

## 2021-10-13 ENCOUNTER — Other Ambulatory Visit (INDEPENDENT_AMBULATORY_CARE_PROVIDER_SITE_OTHER): Payer: Self-pay | Admitting: Family

## 2021-10-13 DIAGNOSIS — E785 Hyperlipidemia, unspecified: Secondary | ICD-10-CM

## 2021-10-28 ENCOUNTER — Encounter (INDEPENDENT_AMBULATORY_CARE_PROVIDER_SITE_OTHER): Payer: Self-pay | Admitting: Family

## 2021-11-11 ENCOUNTER — Ambulatory Visit (INDEPENDENT_AMBULATORY_CARE_PROVIDER_SITE_OTHER): Payer: Medicare Other | Admitting: Family

## 2021-11-13 ENCOUNTER — Ambulatory Visit (INDEPENDENT_AMBULATORY_CARE_PROVIDER_SITE_OTHER): Payer: Medicare Other | Admitting: Family

## 2021-11-13 ENCOUNTER — Encounter (INDEPENDENT_AMBULATORY_CARE_PROVIDER_SITE_OTHER): Payer: Self-pay | Admitting: Family

## 2021-11-13 VITALS — BP 118/66 | HR 69 | Temp 98.9°F | Ht <= 58 in | Wt 105.6 lb

## 2021-11-13 DIAGNOSIS — G8929 Other chronic pain: Secondary | ICD-10-CM

## 2021-11-13 DIAGNOSIS — K2270 Barrett's esophagus without dysplasia: Secondary | ICD-10-CM

## 2021-11-13 DIAGNOSIS — E785 Hyperlipidemia, unspecified: Secondary | ICD-10-CM

## 2021-11-13 DIAGNOSIS — M25552 Pain in left hip: Secondary | ICD-10-CM

## 2021-11-13 DIAGNOSIS — R251 Tremor, unspecified: Secondary | ICD-10-CM

## 2021-11-13 MED ORDER — OMEPRAZOLE 20 MG PO CPDR
20.0000 mg | DELAYED_RELEASE_CAPSULE | Freq: Every day | ORAL | 0 refills | Status: DC | PRN
Start: 2021-11-13 — End: 2022-02-24

## 2021-11-13 MED ORDER — ATORVASTATIN CALCIUM 10 MG PO TABS
10.0000 mg | ORAL_TABLET | Freq: Every day | ORAL | 0 refills | Status: DC
Start: 2021-11-13 — End: 2022-07-11

## 2021-11-13 NOTE — Progress Notes (Signed)
Have you seen any specialists/other providers since your last visit with [place]?    No    Arm preference verified?   Yes, no preference    Health Maintenance Due   Topic Date Due    Advance Directive on File  Never done    Spirometry Annual  Never done    Tetanus Ten-Year  Never done    COVID-19 Vaccine (1) Never done    Pneumonia Vaccine Age 82+ (2 - PPSV23 if available, else PCV20) 01/21/2016    Medicare Annual Wellness Visit  04/22/2019    FALLS RISK ANNUAL  07/24/2021

## 2021-11-13 NOTE — Progress Notes (Signed)
Subjective:      Patient ID: Michele [name] is a 81 y.o. female     Chief Complaint   Patient presents with    Hyperlipidemia     Pt is not fasting    Medication Refill     Omeprazole and atorvastatin        Hyperlipidemia  The patient is being seen for a routine follow-up of hyperlipidemia.  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 Comorbid illnesses include tobacco use.  There are no recent interval events.  There is no interval history of myalgias and chest pain. Current therapy includes a statin.  Patient is compliant with the current regimen.          Pt is requesting referral to neurology clinic in [place] for her tremors/shakiness. Pt was seen by neurology and pain management multiple times. Pt's son lives in [place], would like to get second opinion.    The following sections were reviewed this encounter by the provider:   Tobacco  Allergies  Meds  Problems  Med Hx  Surg Hx  Fam Hx         Review of Systems   Constitutional:  Negative for chills, fatigue and fever.   HENT:  Negative for congestion.    Respiratory:  Negative for cough, chest tightness, shortness of breath and wheezing.    Cardiovascular:  Negative for chest pain, palpitations and leg swelling.   Gastrointestinal:  Negative for diarrhea, nausea and vomiting.   Musculoskeletal:  Positive for gait problem. Negative for arthralgias.   Neurological:  Positive for tremors and weakness. Negative for dizziness, seizures, speech difficulty, numbness and headaches.   Hematological:  Does not bruise/bleed easily.          BP 118/66 (BP Site: Right arm, Patient Position: Sitting, Cuff Size: Medium)   Pulse 69   Temp 98.9 F (37.2 C) (Tympanic)   Ht 1.473 m (4\' 10" )   Wt 47.9 kg (105 lb 9.6 oz)   BMI 22.07 kg/m     Objective:     Physical Exam  Vitals and nursing note reviewed.   Constitutional:       Appearance: Normal appearance. She is well-developed.   HENT:      Head: Normocephalic and atraumatic.   Cardiovascular:       Rate and Rhythm: Normal rate and regular rhythm.      Heart sounds: Normal heart sounds.   Pulmonary:      Effort: Pulmonary effort is normal.      Breath sounds: Normal breath sounds.   Musculoskeletal:      Comments: Gross tremor, affecting lower extremities while standing   Skin:     General: Skin is warm and dry.   Neurological:      Mental Status: She is alert and oriented to person, place, and time.   Psychiatric:         Behavior: Behavior normal.         Thought Content: Thought content normal.         Judgment: Judgment normal.          Assessment:     1. Tremor  - Referral to Neurology (EXTERNAL); Future    2. Chronic left hip pain  - Referral to Neurology (EXTERNAL); Future    3. Hyperlipidemia, unspecified hyperlipidemia type  - atorvastatin (LIPITOR) 10 MG tablet; Take 1 tablet (10 mg) by mouth daily  Dispense: 90 tablet; Refill: 0    4. Barrett's esophagus without dysplasia  -  omeprazole (PriLOSEC) 20 MG capsule; Take 1 capsule (20 mg) by mouth daily as needed (GERD)  Dispense: 90 capsule; Refill: 0        Plan:     1,2) referral is given as requested  3)Chronic, stable  Continue with current medication  4) Chronic, stable    Continue with current medication    Medication list reviewed with patient and updated as indicated.  Risk & Benefits of any new medication(s) were explained to the patient who verbalized understanding & agreed to the treatment plan.  All questions are answered.Patient verbalized understanding of instructions given.  Follow up in 2 weeks AWV and fasting labs    Jahid Weida Berna Spare, NP

## 2021-11-26 ENCOUNTER — Encounter (INDEPENDENT_AMBULATORY_CARE_PROVIDER_SITE_OTHER): Payer: Self-pay | Admitting: Family

## 2021-11-26 ENCOUNTER — Ambulatory Visit (INDEPENDENT_AMBULATORY_CARE_PROVIDER_SITE_OTHER): Payer: Medicare Other | Admitting: Family

## 2021-11-26 VITALS — BP 111/65 | HR 62 | Temp 97.9°F | Ht <= 58 in | Wt 106.0 lb

## 2021-11-26 DIAGNOSIS — J449 Chronic obstructive pulmonary disease, unspecified: Secondary | ICD-10-CM

## 2021-11-26 DIAGNOSIS — Z0000 Encounter for general adult medical examination without abnormal findings: Secondary | ICD-10-CM

## 2021-11-26 DIAGNOSIS — G959 Disease of spinal cord, unspecified: Secondary | ICD-10-CM

## 2021-11-26 DIAGNOSIS — E46 Unspecified protein-calorie malnutrition: Secondary | ICD-10-CM

## 2021-11-26 DIAGNOSIS — D693 Immune thrombocytopenic purpura: Secondary | ICD-10-CM

## 2021-11-26 DIAGNOSIS — E7849 Other hyperlipidemia: Secondary | ICD-10-CM

## 2021-11-26 NOTE — Progress Notes (Signed)
Have you seen any specialists/other providers since your last visit with [place]?    No    Arm preference verified?   Yes, no preference    Health Maintenance Due   Topic Date Due    Advance Directive on File  Never done    Spirometry Annual  Never done    Tetanus Ten-Year  Never done    COVID-19 Vaccine (1) Never done                      [facility]            Michele [name] is a 81 y.o. female who presents today for the following Medicare Wellness Visit:  []  Initial Preventive Physical Exam (IPPE) - "Welcome to Medicare" preventive visit (Vision Screening required)   []  Annual Wellness Visit - Initial  [x]  Annual Wellness Visit - Subsequent     Health Risk Assessment:   During the past month, how would you rate your general health?:  Good  Which of the following tasks can you do without assistance - drive or take the bus alone; shop for groceries or clothes; prepare your own meals; do your own housework/laundry; handle your own finances/pay bills; eat, bathe or get around your home?: Drive or take the bus alone, Do your own housework/laundry, Shop for groceries or clothes, Handle your own finances/pay bills, Prepare your own meals, Eat, bathe, dress or get around your home  Which of the following problems have you been bothered by in the past month - dizzy when standing up; problems using the phone; feeling tired or fatigued; moderate or severe body pain?: Feeling tired or fatigued, Moderate or severe body pain  Do you exercise for about 20 minutes 3 or more days per week?:Yes  During the past month was someone available to help if you needed and wanted help?  For example, if you felt nervous, lonely, got sick and had to stay in bed, needed someone to talk to, needed help with daily chores or needed help just taking care of yourself.: Yes  Do you always wear a seat belt?: Yes  Do you have any trouble taking medications the way you have been told to take them?: No  Have you been given any  information that can help you with keeping track of your medications?: No  Do you have trouble paying for your medications?: No  Have you been given any information that can help you with hazards in your house, such as scatter rugs, furniture, etc?: No  Do you feel unsteady when standing or walking?: Yes  Do you worry about falling?: Yes  Have you fallen two or more times in the past year?: No  Did you suffer any injuries from your falls in the past year?: No     Care Team:   Patient Care Team:  Eliezer Lofts, NP as PCP - General (Family Nurse Practitioner)  Rosealee Albee, MD as Consulting Physician (Cardiology)  Janice Norrie, MD as Consulting Physician (Gastroenterology)  Laurie Panda, MD as Consulting Physician (Neurology)  Marilynn Latino, MD as Consulting Physician (Orthopaedic Surgery)  Amado Coe, MD as Consulting Physician (Ophthalmology)  Tammy Sours, MD as Consulting Physician (Neurological Surgery)      Hospitalizations:   Hospitalization within past year: [x]  No  []  Yes     Diagnosis:      Screenings:       11/23/2020    10:45 AM 11/13/2021  10:54 AM 11/26/2021     8:45 AM   Ambulatory Screenings   Falls Risk: [place] more than 2 times in past year  N N   Falls Risk: Suffer any injuries?  N N   Depression: PHQ2 Total Score 5  0   Depression: PHQ9 Total Score 19  0        Substance Use Disorder Screen:  In the past year, how often have you used the following?  1) Alcohol (For men, 5 or more drinks a day. For women, 4 or more drinks a day)  [x]  Never []  Once or Twice []  Monthly []  Weekly []  Daily or Almost Daily  2) Tobacco Products  [x]  Never []  Once or Twice []  Monthly []  Weekly []  Daily or Almost Daily  3) Prescription Drugs for Non-Medical Reasons  [x]  Never []  Once or Twice []  Monthly []  Weekly []  Daily or Almost Daily  4) Illegal Drugs  [x]  Never []  Once or Twice []  Monthly []  Weekly []  Daily or Almost Daily             Functional Ability/Level of Safety:   Falls  Risk/Home Safety Assessment:  ( see HRA and Screenings sections for additional assessment)  Home Safety: [x]  Stair handrails  [x]  Skid-resistant rugs/remove throw rugs   [x]  Grab bars  [x]  Clear pathways between rooms  [x]  Proper lighting stairs/ bathrooms/bedrooms  Get Up and Go (optional):  []   <20 secs  [x]   >20 secs    []   High risk for falls - Home Safety/Falls Risk Precautions reviewed with pt/family    Hearing Assessment:  Concerns for hearing loss: []  Yes  [x]   No  Hearing aids:   []   Right  []   Left  []   Bilateral   []   None  Whisper Test (optional):  []  Normal  []   Slightly decreased  []   Significantly decreased    Exercise:  Frequency:  []   No formal exercise  [x]   1-2x/wk  []   3-4x/wk  []   >4x/wk  Duration:  []   15-30 mins/day  []   30-45 mins/day  []   45+ mins/day  Intensity:  [x]   Light  []   Moderate  []   Heavy        Activities of Daily Living:   ADL's Independent Minimal  Assistance Moderate  Assistance Total   Assistance   Bathing [x]  []  []  []    Dressing [x]  []  []  []    Mobility   [x]  []  []  []    Transfer [x]  []  []  []    Eating [x]  []  []  []    Toileting [x]  []  []  []      IADL's Independent Minimal  Assistance Moderate  Assistance Total   Assistance   Phone [x]  []  []  []    Housekeeping [x]  []  []  []    Laundry [x]  []  []  []    Transportation [x]  []  []  []    Medications [x]  []  []  []    Finances [x]  []  []  []       ADL assistance: [x]  No assistance needed  []  Spouse  []  Sibling  []  Son   []  Daughter []  Children  []  Home Health Aide []  Other:       Advance Care Planning:   Discussion of Advance Directives:   []  Advance Directive in chart  []  Advance Directive not in chart - requested to provide []  No Advance Directive.  Form Provided  []  No Advance Directive.  Pt declines. [x]  Not addressed today  []  Other:  Exam:   BP 111/65 (BP Site: Left arm, Patient Position: Sitting, Cuff Size: Medium)   Pulse 62   Temp 97.9 F (36.6 C) (Tympanic)   Ht 1.473 m (4\' 10" )   Wt 48.1 kg (106 lb)   BMI 22.15 kg/m       Physical Exam  Vitals and nursing note reviewed.   Constitutional:       Appearance: Normal appearance. Michele [name] is well-developed.   HENT:      Head: Normocephalic and atraumatic.      Right Ear: Hearing, tympanic membrane, ear canal and external ear normal.      Left Ear: Hearing, tympanic membrane, ear canal and external ear normal.      Nose: Nose normal.      Mouth/Throat:      Pharynx: Uvula midline.   Eyes:      Conjunctiva/sclera: Conjunctivae normal.   Cardiovascular:      Rate and Rhythm: Normal rate and regular rhythm.   Pulmonary:      Effort: Pulmonary effort is normal. No respiratory distress.      Breath sounds: Normal breath sounds. No wheezing or rales.   Chest:      Chest wall: No tenderness.   Abdominal:      General: There is no distension.      Palpations: Abdomen is soft. There is no mass.   Skin:     General: Skin is warm and dry.   Neurological:      General: No focal deficit present.      Mental Status: Michele [name] is alert and oriented to person, place, and time.   Psychiatric:         Mood and Affect: Mood normal.         Behavior: Behavior normal.         Thought Content: Thought content normal.         Judgment: Judgment normal.               Evaluation of Cognitive Function:   Mood/affect: [x]  Appropriate  []   Other:   Appearance: [x]  Neatly groomed  [x]  Adequately nourished  []  Other:  Family member/caregiver input: []  Present - no concerns  []   Not present in room  []  Present - concerns:    Cognitive Assessment:  Mini-Cog Result (three word registration- banana, sunrise, chair / clock drawing):   [x]   > 3 points - negative screen for dementia   []  3 recalled words - negative screen for dementia   []  1-2 recalled words and normal clock draw - negative for cognitive impairment   []  1-2 recalled words and abnormal clock draw - positive for cognitive impairment   []  0 recalled words - positive for cognitive impairment         Assessment/Plan:   1. Medicare annual wellness visit, subsequent    2.  Cervical myelopathy    3. Protein-calorie malnutrition, unspecified severity    4. Chronic obstructive pulmonary disease, unspecified COPD type    5. Idiopathic thrombocytopenia purpura    6. Other hyperlipidemia  - Lipid panel  - Comprehensive metabolic panel  - CBC without differential           Tyjai Charbonnet Berna Spare, NP    11/26/2021     The following sections were reviewed this encounter by the provider:   Tobacco  Allergies  Meds  Problems  Med Hx  Surg Hx  Fam Hx  History:   Patient Active Problem List   Diagnosis    Barrett esophagus    S/P colonoscopic polypectomy    Chronic back pain    Osteopenia    Neck pain    Tremor    Idiopathic thrombocytopenia purpura    COPD (chronic obstructive pulmonary disease)    Tobacco abuse    Hoffman sign present    Hyperlipidemia    Benign neoplasm of rectum    Benign neoplasm of transverse colon    Pain in elbow    Hiatal hernia    Paresthesia of lower extremity    Cervical myelopathy    Positive ANA (antinuclear antibody)    Lumbar radiculopathy    Protein-calorie malnutrition, unspecified severity    Anxiety and depression    Chronic left hip pain      Past Medical History:   Diagnosis Date    Abnormal EKG     Arthralgia     Barrett's esophagus     Cervical spinal stenosis     Colonic polyp     Depression     Disc disorder     Gastroesophageal reflux disease     Hypercholesterolemia     Lower back pain     Neck pain     Numbness and tingling of both legs 02/06/2015    Osteoarthritis     Osteopenia      Past Surgical History:   Procedure Laterality Date    APPENDECTOMY (OPEN)  1960?    when was a teenager    KNEE SURGERY Left     FX patella    TUBAL LIGATION  1973     No Known Allergies   Outpatient Medications Marked as Taking for the 11/26/21 encounter (Office Visit) with Eliezer Lofts, NP   Medication Sig Dispense Refill    aspirin EC 81 MG EC tablet Take by mouth.      atorvastatin (LIPITOR) 10 MG tablet Take 1 tablet (10 mg) by mouth daily 90 tablet 0     Calcium Carbonate (CALCIUM 600 PO) Take 1 tablet by mouth daily.          ibuprofen (ADVIL) 600 MG tablet Take 1 tablet (600 mg total) by mouth every 6 (six) hours as needed for Pain 30 tablet 0    omeprazole (PriLOSEC) 20 MG capsule Take 1 capsule (20 mg) by mouth daily as needed (GERD) 90 capsule 0    VITAMIN E PO Take by mouth daily       Social History     Tobacco Use    Smoking status: Every Day     Packs/day: 0.25     Years: 50.00     Pack years: 12.50     Types: Cigarettes    Smokeless tobacco: Never    Tobacco comments:     5-8 cigarettes per day   Vaping Use    Vaping status: Never Used   Substance Use Topics    Alcohol use: No    Drug use: Never      Family History   Problem Relation Age of Onset    Breast cancer Neg Hx     Ovarian cancer Neg Hx     Cancer Neg Hx            ===================================================================    Additional Documentation:

## 2021-11-27 LAB — COMPREHENSIVE METABOLIC PANEL
ALT: 13 IU/L (ref 0–32)
AST (SGOT): 19 IU/L (ref 0–40)
Albumin/Globulin Ratio: 2 (ref 1.2–2.2)
Albumin: 4.4 g/dL (ref 3.6–4.6)
Alkaline Phosphatase: 73 IU/L (ref 44–121)
BUN / Creatinine Ratio: 25 (ref 12–28)
BUN: 14 mg/dL (ref 8–27)
Bilirubin, Total: 0.5 mg/dL (ref 0.0–1.2)
CO2: 26 mmol/L (ref 20–29)
Calcium: 9.2 mg/dL (ref 8.7–10.3)
Chloride: 103 mmol/L (ref 96–106)
Creatinine: 0.56 mg/dL — ABNORMAL LOW (ref 0.57–1.00)
Globulin, Total: 2.2 g/dL (ref 1.5–4.5)
Glucose: 85 mg/dL (ref 70–99)
Potassium: 4.1 mmol/L (ref 3.5–5.2)
Protein, Total: 6.6 g/dL (ref 6.0–8.5)
Sodium: 141 mmol/L (ref 134–144)
eGFR: 91 mL/min/{1.73_m2} (ref >59)

## 2021-11-27 LAB — LIPID PANEL
Cholesterol / HDL Ratio: 2.2 ratio (ref 0.0–4.4)
Cholesterol: 170 mg/dL (ref 100–199)
HDL: 76 mg/dL (ref >39)
LDL Chol Calculated (NIH): 84 mg/dL (ref 0–99)
Triglycerides: 49 mg/dL (ref 0–149)
VLDL Calculated: 10 mg/dL (ref 5–40)

## 2021-11-27 LAB — CBC
Hematocrit: 37.7 % (ref 34.0–46.6)
Hemoglobin: 12.4 g/dL (ref 11.1–15.9)
MCH: 30.8 pg (ref 26.6–33.0)
MCHC: 32.9 g/dL (ref 31.5–35.7)
MCV: 94 fL (ref 79–97)
Platelets: 128 10*3/uL — ABNORMAL LOW (ref 150–450)
RBC: 4.03 x10E6/uL (ref 3.77–5.28)
RDW: 12.9 % (ref 11.7–15.4)
WBC: 3.5 10*3/uL (ref 3.4–10.8)

## 2021-12-23 NOTE — Progress Notes (Unsigned)
Subjective:      Patient ID: Michele [name] is a 81 y.o. female     No chief complaint on file.       HPI      The following sections were reviewed this encounter by the provider:        Review of Systems       There were no vitals taken for this visit.    Objective:     Physical Exam     Assessment:     There are no diagnoses linked to this encounter.      Plan:     ***    Margarete Horace Berna Spare, NP

## 2021-12-24 ENCOUNTER — Encounter (INDEPENDENT_AMBULATORY_CARE_PROVIDER_SITE_OTHER): Payer: Self-pay | Admitting: Family

## 2021-12-24 ENCOUNTER — Other Ambulatory Visit (INDEPENDENT_AMBULATORY_CARE_PROVIDER_SITE_OTHER): Payer: Self-pay | Admitting: Family

## 2021-12-24 ENCOUNTER — Ambulatory Visit (INDEPENDENT_AMBULATORY_CARE_PROVIDER_SITE_OTHER): Payer: Medicare Other | Admitting: Family

## 2021-12-24 VITALS — BP 121/71 | HR 85 | Temp 98.1°F | Ht <= 58 in | Wt 102.8 lb

## 2021-12-24 DIAGNOSIS — M5416 Radiculopathy, lumbar region: Secondary | ICD-10-CM

## 2021-12-24 DIAGNOSIS — G8929 Other chronic pain: Secondary | ICD-10-CM

## 2021-12-24 DIAGNOSIS — M5440 Lumbago with sciatica, unspecified side: Secondary | ICD-10-CM

## 2021-12-24 DIAGNOSIS — R2681 Unsteadiness on feet: Secondary | ICD-10-CM

## 2021-12-24 NOTE — Progress Notes (Signed)
Have you seen any specialists/other providers since your last visit with [place]?    No    Arm preference verified?   Yes, no preference    Health Maintenance Due   Topic Date Due    Advance Directive on File  Never done    Spirometry Annual  Never done    Tetanus Ten-Year  Never done    COVID-19 Vaccine (1) Never done

## 2022-01-01 ENCOUNTER — Telehealth (INDEPENDENT_AMBULATORY_CARE_PROVIDER_SITE_OTHER): Payer: Self-pay | Admitting: Family

## 2022-01-01 NOTE — Telephone Encounter (Signed)
Faxed Office visit notes that detail patient's need for MRI due to hip pain.

## 2022-01-01 NOTE — Telephone Encounter (Signed)
Zena from [facility] called to get more documentation  about PA request for MRI lumbar.   Needs info about physical assessment and conservative treatments patient has completed.     Please contact Medical benefits  [facility] # [phone]  Please fax documentation to Attn/ RN reviewer fax is [phone] please include patient D.O.B and insurance ID  Thank you!

## 2022-01-03 ENCOUNTER — Encounter (INDEPENDENT_AMBULATORY_CARE_PROVIDER_SITE_OTHER): Payer: Self-pay | Admitting: Family

## 2022-01-09 ENCOUNTER — Ambulatory Visit: Payer: Medicare Other

## 2022-01-15 ENCOUNTER — Encounter (INDEPENDENT_AMBULATORY_CARE_PROVIDER_SITE_OTHER): Payer: Self-pay | Admitting: Family

## 2022-02-20 NOTE — Progress Notes (Signed)
Subjective:      Patient ID: Michele [name] is a 81 y.o. female     Chief Complaint   Patient presents with    Hyperlipidemia    Chest Pain     Pt reports pain under her right breast   X 2 weeks          HPI   C/o breast pain on the right side, increased intensity of tremors, and burning sensation on  lower extremities. She states that pain on the breast sharp and comes without any provocation.   Her ongoing tremors are worsening, was seen by Dr. Roque Cash, trial of propranolol 80 mg, which was not helpful in controlling her tremors.   She states that at times her tremors are so strong and causes her to "fly across the room" .   She has known radiculopathy.She was seen by [facility] (Dr. Riki Altes) but she states they "did nothing" to treat her symptoms.   The following sections were reviewed this encounter by the provider:   Tobacco  Allergies  Meds  Problems  Med Hx  Surg Hx  Fam Hx         Review of Systems   Constitutional:  Negative for chills, fatigue and fever.   HENT:  Negative for congestion.    Respiratory:  Negative for cough, chest tightness, shortness of breath and wheezing.    Cardiovascular:  Negative for chest pain, palpitations and leg swelling.   Gastrointestinal:  Negative for diarrhea, nausea and vomiting.   Musculoskeletal:  Positive for arthralgias, back pain and myalgias (burning sensation of BLLE).   Neurological:  Positive for tremors, weakness and numbness. Negative for dizziness, seizures, speech difficulty and headaches.          BP 114/57 (BP Site: Left arm, Patient Position: Sitting, Cuff Size: Medium)   Pulse 77   Temp 99.1 F (37.3 C) (Tympanic)   Ht 1.473 m (4\' 10" )   BMI 21.49 kg/m     Objective:     Physical Exam  Vitals and nursing note reviewed.   Constitutional:       Appearance: Normal appearance. She is well-developed.      Comments: Frail elderly   HENT:      Head: Normocephalic and atraumatic.   Cardiovascular:      Rate and Rhythm: Normal rate  and regular rhythm.      Heart sounds: Normal heart sounds.   Pulmonary:      Effort: Pulmonary effort is normal.      Breath sounds: Normal breath sounds.   Chest:   Breasts:     Right: Tenderness present.      Left: No tenderness.       Skin:     General: Skin is warm and dry.   Neurological:      General: No focal deficit present.      Mental Status: She is alert and oriented to person, place, and time.      Motor: Tremor present.      Coordination: Coordination is intact.      Gait: Gait is intact.   Psychiatric:         Mood and Affect: Mood normal.         Behavior: Behavior normal.         Thought Content: Thought content normal.         Judgment: Judgment normal.          Assessment:  1. Thrombocytopenia  - Vitamin B12  - Folate  - CBC and differential    2. Tremor  - Referral to Neurology ([place]); Future    3. Lumbar radiculopathy  - Pain Referral: [facility]; Future    4. Breast pain, right  - Mammo Diagnostic w/Tomo Bilat; Future        Plan:     1) Low platelet levels previously. Recheck B12, CBC and folate today  2) advised to follow up with neuro  3) referral to [place] [facility]   4) Mammo ordered      Medication list reviewed with patient and updated as indicated.  Risk & Benefits of any new medication(s) were explained to the patient who verbalized understanding & agreed to the treatment plan.  All questions are answered.Patient verbalized understanding of instructions given.      Akai Dollard Berna Spare, NP

## 2022-02-21 ENCOUNTER — Other Ambulatory Visit (INDEPENDENT_AMBULATORY_CARE_PROVIDER_SITE_OTHER): Payer: Self-pay | Admitting: Family

## 2022-02-21 ENCOUNTER — Encounter (INDEPENDENT_AMBULATORY_CARE_PROVIDER_SITE_OTHER): Payer: Self-pay | Admitting: Family

## 2022-02-21 ENCOUNTER — Ambulatory Visit (INDEPENDENT_AMBULATORY_CARE_PROVIDER_SITE_OTHER): Payer: Medicare Other | Admitting: Family

## 2022-02-21 VITALS — BP 114/57 | HR 77 | Temp 99.1°F | Ht <= 58 in

## 2022-02-21 DIAGNOSIS — D696 Thrombocytopenia, unspecified: Secondary | ICD-10-CM

## 2022-02-21 DIAGNOSIS — K2270 Barrett's esophagus without dysplasia: Secondary | ICD-10-CM

## 2022-02-21 DIAGNOSIS — N644 Mastodynia: Secondary | ICD-10-CM

## 2022-02-21 DIAGNOSIS — M5416 Radiculopathy, lumbar region: Secondary | ICD-10-CM

## 2022-02-21 DIAGNOSIS — R251 Tremor, unspecified: Secondary | ICD-10-CM

## 2022-02-21 NOTE — Progress Notes (Signed)
Have you seen any specialists/other providers since your last visit with [place]?    No    Arm preference verified?   Yes, no preference    Health Maintenance Due   Topic Date Due    Advance Directive on File  Never done    Spirometry Annual  Never done    Tetanus Ten-Year  Never done    COVID-19 Vaccine (1) Never done    INFLUENZA VACCINE  02/04/2022

## 2022-02-22 LAB — CBC AND DIFFERENTIAL
Baso(Absolute): 0 10*3/uL (ref 0.0–0.2)
Basophils Automated: 1 %
Eosinophils Absolute: 0.1 10*3/uL (ref 0.0–0.4)
Eosinophils Automated: 2 %
Hematocrit: 37.8 % (ref 34.0–46.6)
Hemoglobin: 12.6 g/dL (ref 11.1–15.9)
Immature Granulocytes Absolute: 0 10*3/uL (ref 0.0–0.1)
Immature Granulocytes: 0 %
Lymphocytes Absolute: 1.1 10*3/uL (ref 0.7–3.1)
Lymphocytes Automated: 27 %
MCH: 30.6 pg (ref 26.6–33.0)
MCHC: 33.3 g/dL (ref 31.5–35.7)
MCV: 92 fL (ref 79–97)
Monocytes Absolute: 0.3 10*3/uL (ref 0.1–0.9)
Monocytes: 7 %
Neutrophils Absolute Count: 2.6 10*3/uL (ref 1.4–7.0)
Neutrophils: 63 %
Platelets: 153 10*3/uL (ref 150–450)
RBC: 4.12 x10E6/uL (ref 3.77–5.28)
RDW: 12.6 % (ref 11.7–15.4)
WBC: 4.1 10*3/uL (ref 3.4–10.8)

## 2022-02-22 LAB — VITAMIN B12: Vitamin B-12: 808 pg/mL (ref 232–1245)

## 2022-02-22 LAB — FOLATE: Folate: >20 ng/mL (ref >3.0)

## 2022-04-25 ENCOUNTER — Encounter (INDEPENDENT_AMBULATORY_CARE_PROVIDER_SITE_OTHER): Payer: Self-pay | Admitting: Family

## 2022-04-25 ENCOUNTER — Ambulatory Visit (INDEPENDENT_AMBULATORY_CARE_PROVIDER_SITE_OTHER): Payer: Medicare Other | Admitting: Family

## 2022-04-25 VITALS — BP 110/69 | HR 72 | Temp 98.4°F | Ht <= 58 in | Wt 106.8 lb

## 2022-04-25 DIAGNOSIS — M25552 Pain in left hip: Secondary | ICD-10-CM

## 2022-04-25 DIAGNOSIS — G8929 Other chronic pain: Secondary | ICD-10-CM

## 2022-04-25 DIAGNOSIS — M5440 Lumbago with sciatica, unspecified side: Secondary | ICD-10-CM

## 2022-04-25 NOTE — Progress Notes (Unsigned)
Have you seen any specialists/other providers since your last visit with us?    No    Arm preference verified?   Yes, no preference    Health Maintenance Due   Topic Date Due    Advance Directive on File  Never done    Spirometry Annual  Never done    Tetanus Ten-Year  Never done    COVID-19 Vaccine (1) Never done    INFLUENZA VACCINE  02/04/2022

## 2022-04-25 NOTE — Progress Notes (Signed)
Subjective:      Patient ID: Michele [name] is a 81 y.o. female     Chief Complaint   Patient presents with   . Back Pain     Pt requesting PT   . Hip Pain        Back Pain  This is a chronic problem. The pain is present in the lumbar spine, gluteal and sacro-iliac. The quality of the pain is described as aching. The symptoms are aggravated by twisting, sitting, position, bending and standing. Associated symptoms include leg pain and weakness. Pertinent negatives include no abdominal pain, bladder incontinence, chest pain, fever, headaches, numbness, paresis or tingling. She has tried home exercises and heat for the symptoms. The treatment provided mild relief.         The following sections were reviewed this encounter by the provider:   Tobacco  Allergies  Meds  Problems  Med Hx  Surg Hx  Fam Hx         Review of Systems   Constitutional:  Negative for chills, fatigue and fever.   HENT:  Negative for congestion.    Respiratory:  Negative for cough, chest tightness, shortness of breath and wheezing.    Cardiovascular:  Negative for chest pain, palpitations and leg swelling.   Gastrointestinal:  Negative for abdominal pain, diarrhea, nausea and vomiting.   Genitourinary:  Negative for bladder incontinence.   Musculoskeletal:  Positive for arthralgias, back pain and gait problem.   Neurological:  Positive for tremors and weakness. Negative for dizziness, tingling, seizures, speech difficulty, numbness and headaches.          BP 110/69 (BP Site: Left arm, Patient Position: Sitting, Cuff Size: Medium)   Pulse 72   Temp 98.4 F (36.9 C) (Tympanic)   Ht 1.473 m (4\' 10" )   Wt 48.4 kg (106 lb 12.8 oz)   BMI 22.32 kg/m     Objective:     Physical Exam  Vitals and nursing note reviewed.   Constitutional:       Appearance: Normal appearance. She is well-developed.   HENT:      Head: Normocephalic and atraumatic.   Cardiovascular:      Rate and Rhythm: Normal rate and regular rhythm.      Heart sounds: Normal  heart sounds.   Pulmonary:      Effort: Pulmonary effort is normal.      Breath sounds: Normal breath sounds.   Musculoskeletal:         General: No tenderness or deformity. Normal range of motion.      Cervical back: Normal range of motion and neck supple.      Lumbar back: No swelling, edema, deformity, lacerations, spasms, tenderness or bony tenderness. Normal range of motion.   Skin:     General: Skin is warm and dry.   Neurological:      General: No focal deficit present.      Mental Status: She is alert and oriented to person, place, and time.   Psychiatric:         Behavior: Behavior normal.         Thought Content: Thought content normal.         Judgment: Judgment normal.        Assessment:     1. Chronic bilateral low back pain with sciatica, sciatica laterality unspecified  - Referral to Physical Therapy-[facility]; Future    2. Chronic left hip pain  - Referral to Physical Therapy-[facility];  Future        Plan:         Eliezer Lofts, NP

## 2022-05-17 ENCOUNTER — Encounter (INDEPENDENT_AMBULATORY_CARE_PROVIDER_SITE_OTHER): Payer: Self-pay | Admitting: Family

## 2022-05-19 ENCOUNTER — Encounter (INDEPENDENT_AMBULATORY_CARE_PROVIDER_SITE_OTHER): Payer: Self-pay | Admitting: Family

## 2022-05-19 ENCOUNTER — Other Ambulatory Visit (INDEPENDENT_AMBULATORY_CARE_PROVIDER_SITE_OTHER): Payer: Self-pay | Admitting: Family

## 2022-05-19 DIAGNOSIS — W57XXXS Bitten or stung by nonvenomous insect and other nonvenomous arthropods, sequela: Secondary | ICD-10-CM

## 2022-05-19 NOTE — Progress Notes (Signed)
Lyme test

## 2022-05-20 ENCOUNTER — Other Ambulatory Visit: Payer: Medicare Other

## 2022-05-26 LAB — LYME DISEASE TOTAL ANTIBODY WITH REFLEX TO IMMUNOASSAY MTTT

## 2022-06-13 ENCOUNTER — Inpatient Hospital Stay: Payer: Medicare Other | Attending: Family | Admitting: Rehabilitative and Restorative Service Providers"

## 2022-06-13 ENCOUNTER — Encounter: Payer: Self-pay | Admitting: Rehabilitative and Restorative Service Providers"

## 2022-06-13 VITALS — BP 107/62 | HR 72

## 2022-06-13 DIAGNOSIS — G8929 Other chronic pain: Secondary | ICD-10-CM

## 2022-06-13 DIAGNOSIS — M5451 Vertebrogenic low back pain: Secondary | ICD-10-CM | POA: Insufficient documentation

## 2022-06-13 DIAGNOSIS — M5440 Lumbago with sciatica, unspecified side: Secondary | ICD-10-CM

## 2022-06-13 NOTE — Progress Notes (Signed)
Name:Michele [name] Age: 81 y.o.   Date of Service: 06/13/2022  Referring Physician: Eliezer Lofts, NP   Date of Injury: 04/25/2022    PT Date Care Plan Established/Reviewed: 06/13/2022  PT Date Treatment Started: 06/13/2022    OT Date Care Plan Established/Reviewed: No data found  OT Date Treatment Started: No data found    (Historic) Date Care Plan Established/Reviewed No data was found  No data was found  (Historic) Date Treatment Started No data was found 06/13/2022    End of Certification Date: 09/10/2022  Sessions in Plan of Care: 16  Surgery Date: No data was found  MD Follow-up: No data was found  Medbridge Code: No data was found    Visit Count: 1   Diagnosis:    Diagnosis ICD-10-CM Associated Order   1. 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 Pt states she had problems before, but its been worse since.  Says when she stands on her Lt side she she shakes. Pt states that the shaking has gotten worse int he last year. Cannot stand for too long, cannot sit for too long. Walking makes her really tired. Has difficulty lifting objects from the ground. Pt was referred to a neurologist for the shaking by Dr Alice Reichert. Says that she has gone to them before and was told that it was spinal stenosis. Says the pain does fluctuate. Feels some burning and "tickling" feeling in the front of the leg.     Pain (location/type/mechanism): Low back pain. Lt posterior hip pain. Anterior Lt knee pain knee and shin pain.     Aggravators: Walking, standing, sitting, lifting objects, vacuuming  Easers: Lying down, massage    Imaging: No recent imaging    PMH: Lt patellar fx and ORIFin 2018. Had hardware taken out in 2019. Spinal stenosis    PLOF: See below    Red Flags: Tingling in anterorlateral thigh into anterior shin. This comes and  goes. Has been going on since 2018. Tingling has been similar since 2018    Functional Limitations (PLOF): Prolonged standing (Has been worsening over the last year)  Prolonged walking, able to walk a couple of blocks (Has been worsening over the last year)  Prolonged standing (Has been worsening over the last year)  Lifting objects (Has been worsening over the last year)    Outcome Measure   Tool Used/Details: FOTO  Score: 34  Predicted Functional Outcome: 50    Social Support/Occupation    Lives in: multiple level home  community based residential facility    Lives with: adult children    Occupation: Retired           Precautions: No data was found  Allergies: Patient has no known allergies.    Past Medical History:   Diagnosis Date    Abnormal EKG     Arthralgia     Barrett's esophagus     Cervical spinal stenosis     Colonic polyp     Depression     Disc disorder     Gastroesophageal reflux disease     Hypercholesterolemia     Lower back pain     Neck pain     Numbness and tingling of both legs  02/06/2015    Osteoarthritis     Osteopenia        Objective     Active Range of Motion (degrees)     Lumbar   Extension: 12 degrees with pain  Flexion functional reach: Floor with pain    Strength/Myotome Testing (/5)     Left Hip   Planes of Motion   Flexion (L1/2): 4 and with pain  Extension: 4-  Abduction: 4- and with pain    Right Hip   Planes of Motion   Flexion (L1/2): 4+  Extension: 4-  Abduction: 4+    Left Knee   Flexion (S1): 5  Extension (L3): 5    Right Knee   Flexion (S1): 5  Extension (L3): 5    Left Ankle/Foot   Dorsiflexion (L4): 5  Plantar flexion: 5    Right Ankle/Foot   Dorsiflexion (L4): 5  Plantar flexion: 5    Neurological Testing     Sensation     Lumbar   Left   Intact: light touch    Right   Intact: light touch    Reflexes   Left   Patellar (L4): normal (2+)  Achilles (S1): trace (1+)  Clonus sign: negative    Right   Patellar (L4): normal (2+)  Achilles (S1): normal (2+)            Seated posture:  flexion cues: no change; extension cues: no change in pain     SFMA Top Tier Screen    Movement FN FP DP DN   Multi-segmental Flexion  x     Multi-segmental Extension   x    Multi-segmental Rotation X(Lt)  X (Rt)    Single Leg Stance    X (BIL)   Overhead Deep Squat   x          BP: 107/62 Heart Rate: 72    Treatment     Therapeutic Exercises - Justified to address any of the following:  To develop strength, endurance, ROM and/or flexibility.   -Pt educated on symptoms and potential causes of symptoms with involved anatomy.   -HEP reviewed and discussed reps and importance of performing.  -Discussed POC, diagnosis, prognosis, and frequency of visits needed. Pt consents to plan.    Trialed DL bridge- cramp in hamstring       ---      Flowsheet Row ---   Total Time    Timed Minutes 8 minutes   Untimed Minutes 30 minutes   Total Time 38 minutes          Assessment   Khristi is a 81 y.o. female presenting with Low back/Lt hip/Lt knee pain who requires skilled Physical Therapy services.    Clinical presentation: stable - predictable recovery pattern  Barriers to therapy: Time since onset of injury/illness/exacerbation - resulting in multi-joint involvement  Past surgical history - surgical intervention to regionally interdependent body region    Functional Limitations (PLOF): Prolonged standing (Has been worsening over the last year)  Prolonged walking, able to walk a couple of blocks (Has been worsening over the last year)  Prolonged standing (Has been worsening over the last year)  Lifting objects (Has been worsening over the last year)  Prognosis: excellent  Patient is aware of diagnosis, prognosis and consents to plan of care: Yes  Plan   Visits per week: 2  Number of Sessions: 16  Direct One on One  96045: Therapeutic Exercise: To Develop Strength and Endurance, ROM and  Flexibility  [phone]: Gait Training  91478: Neuromuscular Reeducation (Proprioceptive Neuromuscular Faciliation)  97140: Manual Therapy techniques  (mobilization, manipulation, manual traction) (Grade I-V to lumbar spine, pelvic girdle, and regionally interdependent joints, soft tissue mobilization, instrument assisted soft tissue mobilization.)  97530: Therapeutic Activities: Dynamic activities to improve functional performance  Dry Needling  Supervised Modalities  97012: Mechnical traction  97014: Electrical stimulation  Plan for Next Session -: Gentle hip strengthening, assess lumbar spine PA mobilizations, Assess soft tissue restrictions, Core strengthening, balance      Goals      Goal 1: Pt will be able to demo HEP with zero to min cuing with good form to allow for independent performance at home and return to PLOF     Sessions: 16      Goal 2: Pt will score >/= 50/100 on FOTO FS Primary measure vs 34/100 @ IE, demonstrating improved functional mobility for return to PLOF     Sessions: 16      Goal 3: Patient will demonstrate 4+/5 MMT hip abduction strength to facilitate improved walking toleranc without excessive effort       Sessions: 16                                     Westly Pam, DPT

## 2022-06-18 ENCOUNTER — Inpatient Hospital Stay: Payer: Medicare Other | Attending: Family | Admitting: Rehabilitative and Restorative Service Providers"

## 2022-06-18 DIAGNOSIS — M5451 Vertebrogenic low back pain: Secondary | ICD-10-CM | POA: Insufficient documentation

## 2022-06-18 NOTE — PT/OT Therapy Note (Signed)
Name: Michele [name] Age: 81 y.o.   Date of Service: 06/18/2022  Referring Physician: Eliezer Lofts, NP   Date of Injury: 04/25/2022    PT Date Care Plan Established/Reviewed: 06/13/2022  PT Date Treatment Started: 06/13/2022    OT Date Care Plan Established/Reviewed: No data found  OT Date Treatment Started: No data found    (Historic) Date Care Plan Established/Reviewed No data was found  No data was found  (Historic) Date Treatment Started No data was found 06/13/2022    End of Certification Date: 09/10/2022  Sessions in Plan of Care: 16  Surgery Date: No data was found  MD Follow-up: No data was found  Medbridge Code: No data was found    Visit Count: 2   Diagnosis:    Diagnosis ICD-10-CM Associated Order   1. 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 No changes since last session    Social Support/Occupation    Lives in: multiple level home  community based residential facility    Lives with: adult children    Occupation: Retired           Precautions: No data was found  Allergies: Patient has no known allergies.    Objective   Painful hip PROM ER on Lt compared to Rt; slight limitations with firm end feel -> less pain with HIP ER PROM    Multi-segmental flexion WNL    DL Squat: Lack of hip hinge, limited depth, painful -> non-painful and to hips parallel from hip isometric interventions    Strength/Myotome Testing (/5)     Left Hip   Planes of Motion   Abduction: 4- and with pain    Right Hip   Planes of Motion   Abduction: 4    Additional Strength Details  Hip abduction MMT improved to 4/5 following hip stability interventions                    Treatment     Therapeutic Exercises - Justified to address any of the following:  To develop strength, endurance, ROM and/or flexibility.   -Time spent for subjective intake, objective measures, and review of HEP- extensive time spent    Hip abduction isometrics with belt 5 reps for 10  seconds with cues for hip abduction; progressed to supine with belt around ankles 5 reps for 10 seconds    DL bridge with belt around knees and hip abduction isometric 5 reps for 10 seconds with cues for hip extension    SL LP 50# 2 sets of 10 reps with cues for setup    Sidelying hip abduction 15 reps with cues for slow tempo and limiting trunk rotation    Neuromuscular Re-Education - Justified to address any of the following:   Re-education of movement, balance, coordination, kinesthetic sense, posture and/or proprioception for sitting and/or standing activities.   SLS 4 reps for 10 seconds to improve balance with cues for limiting UE support    Abdominal bracing with breathing; progressed to 90/90 table top 5 reps for 10 seconds for re-education of movement and improve lumbar proprioception        Therapeutic Activity - Justified to address the following:  Dynamic activities to improve functional performance.  Lateral band walks with RTB around ankles with cues for limiting trunk motion performed several  times the length of the table and back to improve walking performance    Sit to stands with arms crossed and cues for controlling lowering phase, performed several times, in order to improve sit to stand transfers and picking up objects off of ground; performed with biasing Lt leg, as well         ---      Flowsheet Row ---   Total Time    Timed Minutes 40 minutes   Total Time 40 minutes          Assessment   Pt presents to physical therapy with continued posterior Lt hip pain. Pt demonstrates pain reproduced with hip abduction MMT during today's session, however improved following hip stability interventions with less pain. Pt also demonstrated improved DL squat depth with less pain after hip stability interventions. Pt can continue to benefit from physical therapy to decrease pain and return to prior level of function.    Plan   Continue hip stability  F/u HEP      Goals      Goal 1: Pt will be able to demo HEP  with zero to min cuing with good form to allow for independent performance at home and return to PLOF     Sessions: 16      Goal 2: Pt will score >/= 50/100 on FOTO FS Primary measure vs 34/100 @ IE, demonstrating improved functional mobility for return to PLOF     Sessions: 16      Goal 3: Patient will demonstrate 4+/5 MMT hip abduction strength to facilitate improved walking toleranc without excessive effort       Sessions: 16                                     Westly Pamommy Rosalina Dingwall, DPT

## 2022-06-25 ENCOUNTER — Inpatient Hospital Stay: Payer: Medicare Other | Admitting: Rehabilitative and Restorative Service Providers"

## 2022-06-27 ENCOUNTER — Inpatient Hospital Stay: Payer: Medicare Other | Attending: Family | Admitting: Rehabilitative and Restorative Service Providers"

## 2022-06-27 DIAGNOSIS — M5451 Vertebrogenic low back pain: Secondary | ICD-10-CM | POA: Insufficient documentation

## 2022-06-27 NOTE — PT/OT Therapy Note (Signed)
Name: Michele DeitersMaria [name] Age: 81 y.o.   Date of Service: 06/27/2022  Referring Physician: Eliezer Loftsutzen, Hamiyet K, NP   Date of Injury: 04/25/2022    PT Date Care Plan Established/Reviewed: 06/13/2022  PT Date Treatment Started: 06/13/2022    OT Date Care Plan Established/Reviewed: No data found  OT Date Treatment Started: No data found    (Historic) Date Care Plan Established/Reviewed No data was found  No data was found  (Historic) Date Treatment Started No data was found 06/13/2022    End of Certification Date: 09/10/2022  Sessions in Plan of Care: 16  Surgery Date: No data was found  MD Follow-up: No data was found  Medbridge Code: No data was found    Visit Count: 3   Diagnosis:    Diagnosis ICD-10-CM Associated Order   1. 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     Social Support/Occupation    Lives in: multiple level home  community based residential facility    Lives with: adult children    Occupation: Retired  History of Present Illness: History: Pt presents to physical therapy with Low back pain and Lt hip pain and Lt knee pain starting after the knee surgery. Pt states she had problems before, but its been worse since.  Says when she stands on her Lt side she she shakes. Pt states that the shaking has gotten worse int he last year. Cannot stand for too long, cannot sit for too long. Walking makes her really tired. Has difficulty lifting objects from the ground. Pt was referred to a neurologist for the shaking by Dr Alice Reichertutzen. Says that she has gone to them before and was told that it was spinal stenosis. Says the pain does fluctuate. Feels some burning and "tickling" feeling in the front of the leg.          Precautions: No data was found  Allergies: Patient has no known allergies.    Objective   Painful hip PROM ER on Lt compared to Rt; slight limitations with firm end feel -> less pain with HIP ER PROM    Multi-segmental flexion  WNL    DL Squat: Lack of hip hinge, limited depth, painful -> non-painful and to hips parallel from hip isometric interventions    Strength/Myotome Testing (/5)     Left Hip   Planes of Motion   Abduction: 4- and with pain    Right Hip   Planes of Motion   Abduction: 4    Additional Strength Details  Hip abduction MMT improved to 4/5 following hip stability interventions                    Treatment     Therapeutic Exercises - Justified to address any of the following:  To develop strength, endurance, ROM and/or flexibility.   Subjective taken to plan treatment session     Bridges x15 for warmup     Active HS stretch x12 B   -cuing for proper form and seq     Contract relax stretch of R hip ER     Seated hip flexion X20 B     DHR 2x10  with UE support on table     Standing marches with UE support 2x10 B     SLR #2.5 2x10 B  LAQ #2.5 2x10 B         Neuromuscular Re-Education - Justified to address any of the following:   Re-education of movement, balance, coordination, kinesthetic sense, posture and/or proprioception for sitting and/or standing activities.   #2.5 standing hip 3 ways 2x10 B   -cuing for proper form and to prevent lateral lean     R clamshells 2x10   -cuing to prevent pelvic rotation     Lateral steps onto and off airex x20     Tandem stance on airex 2x20" B         Therapeutic Activity - Justified to address the following:  Dynamic activities to improve functional performance.           ---      Flowsheet Row ---   Total Time    Timed Minutes 43 minutes   Total Time 43 minutes          Assessment   Michele [name] responded well to treatment today. Fatigued appropriately and discussed purchasing a collapsible cane for safety as she loses her balance occasionally. Req mod cuing for proper form throughout session. Worked on balance and stability to reduce risk for future falls. No symptoms reported. Will benefit from further skilled PT sessions to improve functional mobility.     Plan   Update HEP   Cont  working on balance, hip and glute exercises       Goals      Goal 1: Pt will be able to demo HEP with zero to min cuing with good form to allow for independent performance at home and return to PLOF     Sessions: 16      Goal 2: Pt will score >/= 50/100 on FOTO FS Primary measure vs 34/100 @ IE, demonstrating improved functional mobility for return to PLOF     Sessions: 16      Goal 3: Patient will demonstrate 4+/5 MMT hip abduction strength to facilitate improved walking toleranc without excessive effort       Sessions: 16                                     Tildon Husky, DPT

## 2022-07-04 ENCOUNTER — Inpatient Hospital Stay: Payer: Medicare Other | Admitting: Rehabilitative and Restorative Service Providers"

## 2022-07-08 ENCOUNTER — Inpatient Hospital Stay: Payer: Medicare Other | Attending: Family | Admitting: Rehabilitative and Restorative Service Providers"

## 2022-07-08 DIAGNOSIS — M5451 Vertebrogenic low back pain: Secondary | ICD-10-CM | POA: Insufficient documentation

## 2022-07-08 NOTE — PT/OT Therapy Note (Signed)
Name: Michele [name] Age: 82 y.o.   Date of Service: 07/08/2022  Referring Physician: Eliezer Lofts, NP   Date of Injury: No data found No data found  PT Date Care Plan Established/Reviewed:06/13/2022  PT Date Treatment Started:06/13/2022  OT Date Care Plan Established/Reviewed: No data found  OT Date Treatment Started: No data found    (Historic) Date of Injury:04/25/2022  (Historic) Date Care Plan Established/Reviewed No data was found  No data was found  (Historic) Date Treatment Started No data was found 06/13/2022    End of Certification Date: 09/10/2022  Sessions in Plan of Care: 16  Surgery Date: No data was found  MD Follow-up: No data was found  Medbridge Code: No data was found    Visit Count: 4   Diagnosis:    Diagnosis ICD-10-CM Associated Order   1. 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 Says she feels it with walking. Says that it is slightly less intense. Home exercises have been going well. Feels better for a short time after doing them.     Social Support/Occupation    Lives in: multiple level home  community based residential facility    Lives with: adult children    Occupation: Retired           Precautions: No data was found  Allergies: Patient has no known allergies.    Objective                     Treatment     Therapeutic Exercises - Justified to address any of the following:  To develop strength, endurance, ROM and/or flexibility.   -Time spent for subjective intake, objective measures, and review of HEP    DL bridge holds 10 reps for 5 second holds with cues for hip extension    Standing hip extension with RTB around upper gastroc 15 reps, performed BIL with cues for limiting trunk flexion    SL LP 60# 2 sets of 15 reps with cues for slow tempo    LAQ 7.5# 20 reps with cues for slow tempo    Seated hamstring curls with BTB 20 reps with cues for slow tempo    Kneeling front planks 5 reps for 10 seconds with  cues for neutral spine    Neuromuscular Re-Education - Justified to address any of the following:   Re-education of movement, balance, coordination, kinesthetic sense, posture and/or proprioception for sitting and/or standing activities.   Step over 12 inch hurdles with cues for slow tempo and SBA 2 laps down and back to improve balance    Resisted amb with heavy duty blue band 2 laps length of clinic with cues for tempo to improve balance/coordination    Step ups to 8 inch box with SBA with cues for limiting UE support 20 reps to improve balance/coordination    Manual Therapy - Justified to address any of the following:    Mobilization of joints and soft tissues, manipulation, manual lymphatic drainage, and/or manual traction.    STM throughout glute max, min, piriformis and surrounding myofascia       ---      Flowsheet Row ---   Total Time    Timed Minutes 38 minutes   Total Time 38 minutes  Assessment   Pt presents to physical therapy with continued posterior Lt hip pain. Pt demonstrates pain with PROM ER, however reported less discomfort following manual therapy STM. Pt reported less discomfort with amb following hip and glute stability interventions. Pt continued with balance and lower extremity strengthening interventions with no reports of increase in pain. Pt can continue to benefit from physical therapy to decrease pain and return to prior level of function.    Plan   Continue hip strengthening  Trial wall lunge  Wall sits  Paloff press  Continue STM      Goals      Goal 1: Pt will be able to demo HEP with zero to min cuing with good form to allow for independent performance at home and return to [facility]    Access Code: [email or web address]  URL: [email or web address]  Date: 07/08/2022  Prepared by: Ignace Mandigo    Exercises  - Bridge  - 1 x daily - 10 reps - 10sec hold  - Side Stepping with Resistance at [employer] and Counter Support  - 1 x daily - 5 reps  - Supine 90/90 Abdominal Bracing  - 1 x daily -  10 reps - 10sec hold  - Sidelying Hip Abduction  - 1 x daily - 3 sets - 15 reps  - Forward Step Up with Counter Support  - 4 x weekly - 3 sets - 20 reps   Sessions: 16      Goal 2: Pt will score >/= 50/100 on FOTO FS Primary measure vs 34/100 @ IE, demonstrating improved functional mobility for return to PLOF     Sessions: 16      Goal 3: Patient will demonstrate 4+/5 MMT hip abduction strength to facilitate improved walking toleranc without excessive effort       Sessions: [place], DPT

## 2022-07-09 ENCOUNTER — Ambulatory Visit
Admission: RE | Admit: 2022-07-09 | Discharge: 2022-07-09 | Disposition: A | Discharge status: Home / Self Care | Payer: Medicare Other | Source: Ambulatory Visit | Attending: Ophthalmology | Admitting: Ophthalmology

## 2022-07-09 DIAGNOSIS — M316 Other giant cell arteritis: Secondary | ICD-10-CM | POA: Insufficient documentation

## 2022-07-09 LAB — CBC AND DIFFERENTIAL
Absolute NRBC: 0 10*3/uL (ref 0.00–0.00)
Basophils Absolute Automated: 0.02 10*3/uL (ref 0.00–0.08)
Basophils Automated: 0.5 %
Eosinophils Absolute Automated: 0.06 10*3/uL (ref 0.00–0.44)
Eosinophils Automated: 1.4 %
Hematocrit: 38.4 % (ref 34.7–43.7)
Hgb: 12.4 g/dL (ref 11.4–14.8)
Immature Granulocytes Absolute: 0.01 10*3/uL (ref 0.00–0.07)
Immature Granulocytes: 0.2 %
Instrument Absolute Neutrophil Count: 2.88 10*3/uL (ref 1.10–6.33)
Lymphocytes Absolute Automated: 0.98 10*3/uL (ref 0.42–3.22)
Lymphocytes Automated: 23 %
MCH: 29.8 pg (ref 25.1–33.5)
MCHC: 32.3 g/dL (ref 31.5–35.8)
MCV: 92.3 fL (ref 78.0–96.0)
MPV: 11.2 fL (ref 8.9–12.5)
Monocytes Absolute Automated: 0.32 10*3/uL (ref 0.21–0.85)
Monocytes: 7.5 %
Neutrophils Absolute: 2.88 10*3/uL (ref 1.10–6.33)
Neutrophils: 67.4 %
Nucleated RBC: 0 /100 WBC (ref 0.0–0.0)
Platelets: 157 10*3/uL (ref 142–346)
RBC: 4.16 10*6/uL (ref 3.90–5.10)
RDW: 12 % (ref 11–15)
WBC: 4.27 10*3/uL (ref 3.10–9.50)

## 2022-07-09 LAB — C-REACTIVE PROTEIN: C-Reactive Protein: 0.1 mg/dL (ref 0.0–1.1)

## 2022-07-09 LAB — SEDIMENTATION RATE: Sed Rate: 5 mm/Hr (ref 0–20)

## 2022-07-11 ENCOUNTER — Ambulatory Visit (INDEPENDENT_AMBULATORY_CARE_PROVIDER_SITE_OTHER): Payer: Medicare Other | Admitting: Family

## 2022-07-11 ENCOUNTER — Encounter (INDEPENDENT_AMBULATORY_CARE_PROVIDER_SITE_OTHER): Payer: Self-pay | Admitting: Family

## 2022-07-11 VITALS — BP 113/74 | HR 67 | Temp 97.8°F | Wt 111.0 lb

## 2022-07-11 DIAGNOSIS — Z720 Tobacco use: Secondary | ICD-10-CM

## 2022-07-11 DIAGNOSIS — H539 Unspecified visual disturbance: Secondary | ICD-10-CM

## 2022-07-11 DIAGNOSIS — E785 Hyperlipidemia, unspecified: Secondary | ICD-10-CM

## 2022-07-11 DIAGNOSIS — E7800 Pure hypercholesterolemia, unspecified: Secondary | ICD-10-CM

## 2022-07-11 DIAGNOSIS — H9193 Unspecified hearing loss, bilateral: Secondary | ICD-10-CM

## 2022-07-11 MED ORDER — ATORVASTATIN CALCIUM 10 MG PO TABS
10.0000 mg | ORAL_TABLET | Freq: Every day | ORAL | 1 refills | Status: DC
Start: 2022-07-11 — End: 2023-01-14

## 2022-07-11 NOTE — Progress Notes (Signed)
Have you seen any specialists/other providers since your last visit with [place]?    No    Arm preference verified?   Yes, no preference    Health Maintenance Due   Topic Date Due    Advance Directive on File  Never done    Spirometry Annual  Never done    Tetanus Ten-Year  Never done    COVID-19 Vaccine (1) Never done    Pneumonia Vaccine Age 83+ (2 - PPSV23 or PCV20) 01/21/2016    INFLUENZA VACCINE  02/04/2022

## 2022-07-11 NOTE — Progress Notes (Signed)
Subjective:      Patient ID: Michele [name] is a 82 y.o. female     Chief Complaint   Patient presents with    Referral To Another Service     Requesting Carotid Ultrasound     Medication Refill     Atorvastatin        HPI   Pt states that she was seen by her ophthalmologist recently, retina specialist (Dr. Bridgett Larsson). Right eye vision changes with seeing different colors, shapes and floaters that cover half of her vision. Vision changes has been happening last 3 weeks. Slightly improved today, not as pronounced.  Has pressure on her neck. She was recommend to have US carotid.    Also c/o hearing voices (short buzzing sounds) in her head.   Problem   Hyperlipidemia      Lab Results   Component Value Date    CHOL 170 11/26/2021    CHOL 162 06/04/2021    CHOL 153 08/14/2020     Lab Results   Component Value Date    HDL 76 11/26/2021    HDL 71 06/04/2021    HDL 71 08/14/2020     Lab Results   Component Value Date    LDL 84 11/26/2021    LDL 80 06/04/2021    LDL 73 08/14/2020     Lab Results   Component Value Date    TRIG 49 11/26/2021    TRIG 53 06/04/2021    TRIG 44 08/14/2020   Currently on atorvastatin 10 mg+ takes 81 mg ASA          The following sections were reviewed this encounter by the provider:   Tobacco  Allergies  Meds  Problems  Med Hx  Surg Hx  Fam Hx         Review of Systems   Constitutional:  Negative for chills, fatigue and fever.   HENT:  Negative for congestion.    Respiratory:  Negative for cough, chest tightness, shortness of breath and wheezing.    Cardiovascular:  Negative for chest pain, palpitations and leg swelling.   Gastrointestinal:  Negative for diarrhea, nausea and vomiting.   Musculoskeletal:  Negative for arthralgias.   Neurological:  Negative for dizziness, seizures, speech difficulty, numbness and headaches.          BP 113/74 (BP Site: Left arm, Patient Position: Sitting, Cuff Size: Medium)   Pulse 67   Temp 97.8 F (36.6 C) (Oral)   Wt 50.3 kg (111 lb)   BMI 23.20 kg/m      Objective:     Physical Exam  Vitals and nursing note reviewed.   Constitutional:       Appearance: Normal appearance. She is well-developed.   HENT:      Head: Normocephalic and atraumatic.   Neck:      Vascular: Normal carotid pulses. No carotid bruit.      Trachea: Trachea normal.   Cardiovascular:      Rate and Rhythm: Normal rate and regular rhythm.      Heart sounds: Normal heart sounds.   Pulmonary:      Effort: Pulmonary effort is normal.      Breath sounds: Normal breath sounds.   Musculoskeletal:      Cervical back: Normal range of motion.   Skin:     General: Skin is warm and dry.   Neurological:      General: No focal deficit present.      Mental Status: She is  alert and oriented to person, place, and time.   Psychiatric:         Mood and Affect: Mood normal.         Behavior: Behavior normal.         Thought Content: Thought content normal.         Judgment: Judgment normal.          Assessment:     1. Tobacco abuse  - Referral to Cardiology - EXTERNAL; Future    2. Pure hypercholesterolemia  - Referral to Cardiology - EXTERNAL; Future    3. Hyperlipidemia, unspecified hyperlipidemia type  - atorvastatin (LIPITOR) 10 MG tablet; Take 1 tablet (10 mg) by mouth daily  Dispense: 90 tablet; Refill: 1    4. Vision changes    5. Bilateral change in hearing  - ENT Referral: Gwenith Daily, MD ([facility] [place]); Future        Plan:   1,2,3) She sees Dr. Carlota Raspberry [facility] heart) , advised to follow up with them for consultation.   Continue with atorvastatin 10 mg and ASA 81 mg.    4) seeing retina specialist and has additional tests scheduled. If they are WNL, will refer to neuro.  5) Unclear etiology. Referral to ENT is given.      Medication list reviewed with patient and updated as indicated.  Risk & Benefits of any new medication(s) were explained to the patient who verbalized understanding & agreed to the treatment plan.  All questions are answered.Patient verbalized  understanding of instructions given.        Alletta Mattos Berna Spare, NP

## 2022-07-14 ENCOUNTER — Encounter (INDEPENDENT_AMBULATORY_CARE_PROVIDER_SITE_OTHER): Payer: Self-pay | Admitting: Cardiovascular Disease

## 2022-07-14 ENCOUNTER — Ambulatory Visit (INDEPENDENT_AMBULATORY_CARE_PROVIDER_SITE_OTHER): Payer: Medicare Other | Admitting: Cardiovascular Disease

## 2022-07-14 VITALS — BP 116/70 | HR 59 | Ht 60.0 in | Wt 110.0 lb

## 2022-07-14 DIAGNOSIS — F17210 Nicotine dependence, cigarettes, uncomplicated: Secondary | ICD-10-CM

## 2022-07-14 DIAGNOSIS — E7800 Pure hypercholesterolemia, unspecified: Secondary | ICD-10-CM

## 2022-07-14 DIAGNOSIS — H43393 Other vitreous opacities, bilateral: Secondary | ICD-10-CM

## 2022-07-14 DIAGNOSIS — Z720 Tobacco use: Secondary | ICD-10-CM

## 2022-07-14 LAB — ECG 12-LEAD
Atrial Rate: 61 {beats}/min
IHS MUSE NARRATIVE AND IMPRESSION: NORMAL
P Axis: 68 degrees
P-R Interval: 178 ms
Q-T Interval: 384 ms
QRS Duration: 86 ms
QTC Calculation (Bezet): 386 ms
R Axis: 56 degrees
T Axis: 67 degrees
Ventricular Rate: 61 {beats}/min

## 2022-07-14 NOTE — Progress Notes (Signed)
[facility] office consultation    Referring Provider:Out[place] NP    I had the pleasure of seeing Michele [name] today for cardiovascular evaluation. She is a pleasant 82 y.o. with history of HPL, tobacco use here for cardiac evaluation.    She sees Dr. Imogene Burn who is a retina specialist  Was noted to have vision changes/floaters  She was advised by her retinal specialist to get a carotid ultrasound    She denies any history of TIA or CVA  She has a longstanding history of smoking for about 60 years and currently has cut it down to 5 to 6 cigarettes a day    She does not exercise but is able to walk a mile occasionally without any exertional angina or dyspnea  Her father died of heart disease at 53    PAST MEDICAL HISTORY: She has a past medical history of Abnormal EKG, Arthralgia, Barrett's esophagus, Cervical spinal stenosis, Colonic polyp, Depression, Disc disorder, Gastroesophageal reflux disease, Hypercholesterolemia, Lower back pain, Neck pain, Numbness and tingling of both legs (02/06/2015), Osteoarthritis, and Osteopenia. She has a past surgical history that includes APPENDECTOMY (OPEN) (1960?); Tubal ligation (1973); and Knee surgery (Left).    MEDICATIONS:   Current Outpatient Medications   Medication Instructions    aspirin EC 81 MG EC tablet Oral    atorvastatin (LIPITOR) 10 mg, Oral, Daily    Calcium Carbonate (CALCIUM 600 PO) 1 tablet, Oral, Daily    cyclobenzaprine (FLEXERIL) 5 MG tablet 1/2 to 1 tablet as needed every 8 hours PRN muscle spasm. Caution: causes sedation    ibuprofen (ADVIL) 600 mg, Oral, Every 6 hours PRN    neomycin-polymyxin-dexAMETH (MAXITROL) 3.5-10000-0.1 Suspension No dose, route, or frequency recorded.    omeprazole (PRILOSEC) 20 mg, Oral, Daily PRN    VITAMIN E PO Oral, Daily        ALLERGIES: No Known Allergies    FAMILY HISTORY  Father ; Died at  56? Heart disease    SOCIAL HISTORY: She reports that she has been smoking cigarettes. She has a 12.50 pack-year  smoking history. She has never used smokeless tobacco. She reports that she does not drink alcohol and does not use drugs.    REVIEW OF SYSTEMS    All other systems reviewed and negative except as  Per HPI    PHYSICAL EXAMINATION  General Appearance:  A well-appearing woman in no acute distress.   Vital Signs: BP 116/70 (BP Site: Left arm, Patient Position: Sitting, Cuff Size: Medium)   Pulse (!) 59   Ht 1.524 m (5')   Wt 49.9 kg (110 lb)   SpO2 96%   BMI 21.48 kg/m    BP Readings from Last 3 Encounters:   07/14/22 116/70   07/11/22 113/74   06/13/22 107/62     Wt Readings from Last 3 Encounters:   07/14/22 49.9 kg (110 lb)   07/11/22 50.3 kg (111 lb)   04/25/22 48.4 kg (106 lb 12.8 oz)      Body mass index is 21.48 kg/m.      Neck:  Supple No   jugular venous distention.   Chest: Good air movement and respiratory effort  Bilaterally. Clear to auscultation. No wheezes, rales, or rhonchi   Cardiovascular: Normal S1 and S2 .   No murmurs, gallops or rub. PMI nondisplaced.   Abdomen: Soft, nontender, nondistended,  Extremities: Warm . No  edema,  DP 2+ b/l   Skin: No rash,    Neuro:  Grossly intact.    Psych:  Normal mood and affect.     LABS: ( personally reviewed by me)  Basic Metabolic Profile   Lab Results   Component Value Date    NA 141 11/26/2021    K 4.1 11/26/2021    BUN 14 11/26/2021    CREAT 0.56 (L) 11/26/2021    EGFR 91 11/26/2021    GLU 85 11/26/2021    CA 9.2 11/26/2021         CBC :   Lab Results   Component Value Date    WBC 4.27 07/09/2022    HGB 12.4 07/09/2022    HCT 38.4 07/09/2022    PLT 157 07/09/2022         Cholesterol Panel   Lab Results   Component Value Date    CHOL 170 11/26/2021    HDL 76 11/26/2021    LDL 84 11/26/2021    TRIG 49 11/26/2021    CRP 0.1 07/09/2022        Endocrine   Lab Results   Component Value Date    TSH 2.85 11/26/2020         Coagulation Studies   Lab Results   Component Value Date    DDIMER 0.73 (H) 02/29/2016                  Cardiac Biomarkers   Lab Results    Component Value Date    TROPI 0.01 08/26/2016          PROCEEDURES:( personally reviewed by me)    ECG:  normal sinus rhythm ,, normal axis and intervals, no significant ST/T changes      ASSESSMENT AND PLAN:         Hyperlipidemia:  Lipids reviewed and LDL at 84 from 11/2021  Continue current treatment    Tobacco use  Smoking cessation counseling provided    Floaters:  Carotid ultrasound ordered as requested  Serial monitoring based on results        All questions regarding cardiovascular diseases were answered during this encounter.      Orders Placed This Encounter   Procedures    US Carotid Doppler Bilateral    ECG 12 lead (Normal)     No follow-ups on file.  Antony Haste, MD

## 2022-07-15 ENCOUNTER — Inpatient Hospital Stay: Payer: Medicare Other | Attending: Family | Admitting: Rehabilitative and Restorative Service Providers"

## 2022-07-15 ENCOUNTER — Encounter (INDEPENDENT_AMBULATORY_CARE_PROVIDER_SITE_OTHER): Payer: Self-pay

## 2022-07-15 DIAGNOSIS — M5451 Vertebrogenic low back pain: Secondary | ICD-10-CM | POA: Insufficient documentation

## 2022-07-15 NOTE — PT/OT Therapy Note (Signed)
Name: Michele [name] Age: 82 y.o.   Date of Service: 07/15/2022  Referring Physician: Eliezer Lofts, NP   Date of Injury: No data found No data found  PT Date Care Plan Established/Reviewed:06/13/2022  PT Date Treatment Started:06/13/2022  OT Date Care Plan Established/Reviewed: No data found  OT Date Treatment Started: No data found    (Historic) Date of Injury:04/25/2022  (Historic) Date Care Plan Established/Reviewed No data was found  No data was found  (Historic) Date Treatment Started No data was found 06/13/2022    End of Certification Date: 09/10/2022  Sessions in Plan of Care: 16  Surgery Date: No data was found  MD Follow-up: No data was found  Medbridge Code: No data was found    Visit Count: 5   Diagnosis:    Diagnosis ICD-10-CM Associated Order   1. 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 Says she still has some discomfort in her lower back. States she has pain every day. Unable to say specific activities that aggravate her back. Pt states that standing for longer periods has been less painful. States that walking feels better, but still bothers her some. Says she can lift small things without pain.     Social Support/Occupation    Lives in: multiple level home  community based residential facility    Lives with: adult children    Occupation: Retired           Precautions: No data was found  Allergies: Patient has no known allergies.    Objective   Limited multi-segmental rotation to Rt -> limited seated thoracic rotation test to Rt compared to Lt                     Treatment     Therapeutic Exercises - Justified to address any of the following:  To develop strength, endurance, ROM and/or flexibility.   -Time spent for subjective intake, objective measures, and review of HEP    Recumbent bike 3 minutes (subjective intake)    Open books trialed - held due to discomfort in neck    Supine bicycle  kicks 10 reps - held due to discomfort in low back- regressed to 90/90 table tops 5 reps for 10 seconds    DL bridge holds 5 reps for 10 seconds with cues for hip extension    Doorway pec stretch 4 reps for 10 seconds with cues for setup    Paloff press with green cord 15 reps with cues for bracing, performed BIL    Manual Therapy - Justified to address any of the following:    Mobilization of joints and soft tissues, manipulation, manual lymphatic drainage, and/or manual traction.    Thoracic PA mobilizations grade II    Thoracic corkscrew mobilizations grade II    STM to thoracic paraspinals       ---      Flowsheet Row ---   Total Time    Timed Minutes 39 minutes   Total Time 39 minutes          Assessment   Pt presents to physical therapy with continued low back pain, however reports improvement in hip and lower back symptoms. Pt demonstrates limited thoracic rotation to Rt. Pt had discomfort with open books in her  neck, but was able to perform doorway pec stretch without increase in neck symptoms. Pt was unable to tolerate bicycle kicks in supine, but able to perform 90/90 table tops without increase in pain. Pt can continue to benefit from physical therapy to decrease pain and return to prior level of function.    Plan   Discuss POC  Consider d/c      Goals      Goal 1: Pt will be able to demo HEP with zero to min cuing with good form to allow for independent performance at home and return to [facility]    Access Code: [email or web address]  URL: [email or web address]  Date: 07/08/2022  Prepared by: Danasha Melman    Exercises  - Bridge  - 1 x daily - 10 reps - 10sec hold  - Side Stepping with Resistance at [employer] and Counter Support  - 1 x daily - 5 reps  - Supine 90/90 Abdominal Bracing  - 1 x daily - 10 reps - 10sec hold  - Sidelying Hip Abduction  - 1 x daily - 3 sets - 15 reps  - Forward Step Up with Counter Support  - 4 x weekly - 3 sets - 20 reps   Sessions: 16      Goal 2: Pt will score >/= 50/100 on FOTO FS  Primary measure vs 34/100 @ IE, demonstrating improved functional mobility for return to PLOF    07/15/2022 TD goal d/c'd, pt reported she is unable to understand questioning and son reported findings on the first day.      Sessions: 16      Goal 3: Patient will demonstrate 4+/5 MMT hip abduction strength to facilitate improved walking toleranc without excessive effort       Sessions: [place], DPT

## 2022-07-22 ENCOUNTER — Inpatient Hospital Stay: Payer: Medicare Other | Admitting: Rehabilitative and Restorative Service Providers"

## 2022-07-24 ENCOUNTER — Telehealth (INDEPENDENT_AMBULATORY_CARE_PROVIDER_SITE_OTHER): Payer: Self-pay | Admitting: Family

## 2022-07-24 NOTE — Telephone Encounter (Signed)
Pt  requesting to fax referral for Dr. Duwaine Maxin to Fax# [phone].

## 2022-07-25 NOTE — Telephone Encounter (Signed)
Faxed successfully

## 2022-07-30 ENCOUNTER — Encounter (INDEPENDENT_AMBULATORY_CARE_PROVIDER_SITE_OTHER): Payer: Self-pay

## 2022-08-04 ENCOUNTER — Ambulatory Visit
Admission: RE | Admit: 2022-08-04 | Discharge: 2022-08-04 | Disposition: A | Discharge status: Home / Self Care | Payer: Medicare Other | Source: Ambulatory Visit | Attending: Ophthalmology | Admitting: Ophthalmology

## 2022-08-04 DIAGNOSIS — H348312 Tributary (branch) retinal vein occlusion, right eye, stable: Secondary | ICD-10-CM | POA: Insufficient documentation

## 2022-08-04 LAB — CBC AND DIFFERENTIAL
Absolute NRBC: 0 10*3/uL (ref 0.00–0.00)
Basophils Absolute Automated: 0.03 10*3/uL (ref 0.00–0.08)
Basophils Automated: 0.7 %
Eosinophils Absolute Automated: 0.03 10*3/uL (ref 0.00–0.44)
Eosinophils Automated: 0.7 %
Hematocrit: 39.4 % (ref 34.7–43.7)
Hgb: 12.7 g/dL (ref 11.4–14.8)
Immature Granulocytes Absolute: 0.01 10*3/uL (ref 0.00–0.07)
Immature Granulocytes: 0.2 %
Instrument Absolute Neutrophil Count: 3.1 10*3/uL (ref 1.10–6.33)
Lymphocytes Absolute Automated: 0.91 10*3/uL (ref 0.42–3.22)
Lymphocytes Automated: 20.6 %
MCH: 29.6 pg (ref 25.1–33.5)
MCHC: 32.2 g/dL (ref 31.5–35.8)
MCV: 91.8 fL (ref 78.0–96.0)
MPV: 11.5 fL (ref 8.9–12.5)
Monocytes Absolute Automated: 0.34 10*3/uL (ref 0.21–0.85)
Monocytes: 7.7 %
Neutrophils Absolute: 3.1 10*3/uL (ref 1.10–6.33)
Neutrophils: 70.1 %
Nucleated RBC: 0 /100 WBC (ref 0.0–0.0)
Platelets: 153 10*3/uL (ref 142–346)
RBC: 4.29 10*6/uL (ref 3.90–5.10)
RDW: 12 % (ref 11–15)
WBC: 4.42 10*3/uL (ref 3.10–9.50)

## 2022-08-04 LAB — C-REACTIVE PROTEIN: C-Reactive Protein: <0.1 mg/dL (ref 0.0–1.1)

## 2022-08-04 LAB — SEDIMENTATION RATE: Sed Rate: 3 mm/Hr (ref 0–20)

## 2022-08-08 ENCOUNTER — Encounter (INDEPENDENT_AMBULATORY_CARE_PROVIDER_SITE_OTHER): Payer: Self-pay

## 2022-08-26 ENCOUNTER — Other Ambulatory Visit (INDEPENDENT_AMBULATORY_CARE_PROVIDER_SITE_OTHER): Payer: Self-pay | Admitting: Otolaryngic Allergy

## 2022-09-01 ENCOUNTER — Encounter (INDEPENDENT_AMBULATORY_CARE_PROVIDER_SITE_OTHER): Payer: Self-pay | Admitting: Family

## 2022-11-11 ENCOUNTER — Encounter (INDEPENDENT_AMBULATORY_CARE_PROVIDER_SITE_OTHER): Payer: Self-pay

## 2022-11-18 ENCOUNTER — Ambulatory Visit (INDEPENDENT_AMBULATORY_CARE_PROVIDER_SITE_OTHER): Payer: Medicare Other | Admitting: Family

## 2022-11-18 ENCOUNTER — Encounter (INDEPENDENT_AMBULATORY_CARE_PROVIDER_SITE_OTHER): Payer: Self-pay | Admitting: Family

## 2022-11-18 VITALS — BP 105/63 | HR 70 | Temp 98.0°F | Wt 113.0 lb

## 2022-11-18 DIAGNOSIS — B029 Zoster without complications: Secondary | ICD-10-CM

## 2022-11-18 DIAGNOSIS — K2270 Barrett's esophagus without dysplasia: Secondary | ICD-10-CM

## 2022-11-18 DIAGNOSIS — L309 Dermatitis, unspecified: Secondary | ICD-10-CM

## 2022-11-18 MED ORDER — TRIAMCINOLONE ACETONIDE 0.1 % EX CREA
TOPICAL_CREAM | CUTANEOUS | 1 refills | Status: DC
Start: 2022-11-18 — End: 2022-12-09

## 2022-11-18 MED ORDER — OMEPRAZOLE 20 MG PO CPDR
20.0000 mg | DELAYED_RELEASE_CAPSULE | Freq: Every day | ORAL | 0 refills | Status: DC | PRN
Start: 2022-11-18 — End: 2022-12-09

## 2022-11-18 MED ORDER — VALACYCLOVIR HCL 1 G PO TABS
1000.0000 mg | ORAL_TABLET | Freq: Three times a day (TID) | ORAL | 0 refills | Status: AC
Start: 2022-11-18 — End: 2022-11-25

## 2022-11-18 NOTE — Progress Notes (Signed)
Have you seen any specialists/other providers since your last visit with [place]?    No    Arm preference verified?   Yes, no preference    Health Maintenance Due   Topic Date Due    Advance Directive on File  Never done    Spirometry Annual  Never done    Tetanus Ten-Year  Never done    Pneumonia Vaccine Age 83+ (2 of 2 - PPSV23 or PCV20) 01/21/2016    COVID-19 Vaccine (1 - 2023-24 season) Never done    Medicare Annual Wellness Visit  11/27/2022

## 2022-11-18 NOTE — Progress Notes (Signed)
Subjective:      Patient ID: Michele [name] is a 82 y.o. female     Chief Complaint   Patient presents with    Rash     Neck, hands and upper back x 1 week         Rash  This is a new problem. The current episode started in the past 7 days (6 days ago). The affected locations include the right hand. The rash is characterized by redness, dryness, itchiness, draining, blistering and burning. She was exposed to nothing. Pertinent negatives include no fatigue or fever. Past treatments include nothing. Her past medical history is significant for varicella.         The following sections were reviewed this encounter by the provider:   Tobacco  Allergies  Meds  Problems  Med Hx  Surg Hx  Fam Hx         Review of Systems   Constitutional:  Negative for chills, fatigue and fever.   Eyes:  Negative for discharge and itching.   Skin:  Positive for color change and rash. Negative for pallor and wound.   Allergic/Immunologic: Negative for environmental allergies, food allergies and immunocompromised state.   Hematological:  Negative for adenopathy.          BP 105/63 (BP Site: Left arm, Patient Position: Sitting, Cuff Size: Medium)   Pulse 70   Temp 98 F (36.7 C) (Oral)   Wt 51.3 kg (113 lb)   BMI 22.07 kg/m     Objective:     Physical Exam  Vitals and nursing note reviewed.   Constitutional:       Appearance: Normal appearance. She is well-developed.   HENT:      Head: Normocephalic and atraumatic.   Cardiovascular:      Rate and Rhythm: Normal rate and regular rhythm.      Heart sounds: Normal heart sounds.   Pulmonary:      Effort: Pulmonary effort is normal. No respiratory distress.      Breath sounds: Normal breath sounds. No wheezing or rales.   Chest:      Chest wall: No tenderness.   Skin:     General: Skin is warm and dry.      Findings: Erythema and rash present. Rash is scaling and vesicular.          Neurological:      Mental Status: She is alert and oriented to person, place, and time.   Psychiatric:          Behavior: Behavior normal.         Thought Content: Thought content normal.          Assessment:     1. Herpes zoster without complication  - valACYclovir (Valtrex) 1000 MG tablet; Take 1 tablet (1,000 mg) by mouth 3 (three) times daily for 7 days  Dispense: 21 tablet; Refill: 0    2. Dermatitis  - triamcinolone (KENALOG) 0.1 % cream; Apply to affected area twice daily  Dispense: 45 g; Refill: 1    3. Barrett's esophagus without dysplasia  - omeprazole (PriLOSEC) 20 MG capsule; Take 1 capsule (20 mg) by mouth daily as needed (GERD)  Dispense: 90 capsule; Refill: 0        Plan:     1) Suspicious for herpes zoster.   Not vaccinated for shingles. Take valtrex as prescribed    2) Apply to affected area, avoid itching  May take OTC antihistamine    3)  Chronic stable  Med refilled      Medication list reviewed with patient and updated as indicated.  Risk & Benefits of any new medication(s) were explained to the patient who verbalized understanding & agreed to the treatment plan.  All questions are answered.Patient verbalized understanding of instructions given.      Cadell Gabrielson Berna Spare, NP

## 2022-12-09 ENCOUNTER — Encounter (INDEPENDENT_AMBULATORY_CARE_PROVIDER_SITE_OTHER): Payer: Self-pay | Admitting: Family

## 2022-12-09 ENCOUNTER — Ambulatory Visit (INDEPENDENT_AMBULATORY_CARE_PROVIDER_SITE_OTHER): Payer: Medicare Other | Admitting: Family

## 2022-12-09 VITALS — BP 113/69 | HR 76 | Temp 98.6°F | Ht <= 58 in | Wt 111.4 lb

## 2022-12-09 DIAGNOSIS — Z0000 Encounter for general adult medical examination without abnormal findings: Secondary | ICD-10-CM

## 2022-12-09 DIAGNOSIS — G959 Disease of spinal cord, unspecified: Secondary | ICD-10-CM

## 2022-12-09 DIAGNOSIS — Z0001 Encounter for general adult medical examination with abnormal findings: Secondary | ICD-10-CM

## 2022-12-09 DIAGNOSIS — J449 Chronic obstructive pulmonary disease, unspecified: Secondary | ICD-10-CM

## 2022-12-09 DIAGNOSIS — M5416 Radiculopathy, lumbar region: Secondary | ICD-10-CM

## 2022-12-09 DIAGNOSIS — R208 Other disturbances of skin sensation: Secondary | ICD-10-CM

## 2022-12-09 DIAGNOSIS — M79605 Pain in left leg: Secondary | ICD-10-CM

## 2022-12-09 DIAGNOSIS — D696 Thrombocytopenia, unspecified: Secondary | ICD-10-CM

## 2022-12-09 DIAGNOSIS — E46 Unspecified protein-calorie malnutrition: Secondary | ICD-10-CM

## 2022-12-09 DIAGNOSIS — E7800 Pure hypercholesterolemia, unspecified: Secondary | ICD-10-CM

## 2022-12-09 NOTE — Progress Notes (Signed)
[facility]    Michele [name] is a 82 y.o. female who presents today for the following Medicare Wellness Visit:  []  Initial Preventive Physical Exam (IPPE) - "Welcome to Medicare" preventive visit (Vision Screening required)   []  Annual Wellness Visit - Initial  [x]  Annual Wellness Visit - Subsequent       Health Risk Assessment:   During the past month, how would you rate your general health?:  Fair  Which of the following tasks can you do without assistance - drive or take the bus alone; shop for groceries or clothes; prepare your own meals; do your own housework/laundry; handle your own finances/pay bills; eat, bathe or get around your home?: Drive or take the bus alone, Shop for groceries or clothes, Eat, bathe, dress or get around your home, Prepare your own meals, Do your own housework/laundry  Which of the following problems have you been bothered by in the past month - dizzy when standing up; problems using the phone; feeling tired or fatigued; moderate or severe body pain?: Dizzy when standing up, Feeling tired or fatigued, Moderate or severe body pain  Do you exercise for about 20 minutes 3 or more days per week?:Yes  During the past month was someone available to help if you needed and wanted help?  For example, if you felt nervous, lonely, got sick and had to stay in bed, needed someone to talk to, needed help with daily chores or needed help just taking care of yourself.: Yes  Do you always wear a seat belt?: Yes  Do you have any trouble taking medications the way you have been told to take them?: No  Have you been given any information that can help you with keeping track of your medications?: Yes  Do you have trouble paying for your medications?: No  Have you been given any information that can help you with hazards in your house, such as scatter rugs, furniture, etc?: No  Do you feel unsteady when standing or walking?: Yes  Do you worry about falling?: Yes  Have you fallen  two or more times in the past year?: No  Did you suffer any injuries from your falls in the past year?: No     Care Team:   Patient Care Team:  Eliezer Lofts, NP as PCP - General (Family Nurse Practitioner)  Rosealee Albee, MD as Consulting Physician (Cardiology)  Janice Norrie, MD as Consulting Physician (Gastroenterology)  Laurena Slimmer, MD as Consulting Physician (Neurology)  Marilynn Latino, MD as Consulting Physician (Orthopaedic Surgery)  Amado Coe, MD as Consulting Physician (Ophthalmology)  Tammy Sours, MD as Consulting Physician (Neurological Surgery)  Bari Edward, MD as Consulting Physician (Cardiology)      Hospitalizations:   Hospitalization within past year: [x]  No  []  Yes     Diagnosis:      Screenings:       11/13/2021 11/26/2021 12/09/2022   Ambulatory Screenings   Falls Risk: De [place] more than 2 times in past year N N N   Falls Risk: Suffer any injuries? N N N   Depression: PHQ2 Total Score  0 0   Depression: PHQ9 Total Score  0 0           Substance Use Disorder Screen:  In the past year, how often have you used the following?  1) Alcohol (For men, 5 or more drinks a day. For women, 4 or more drinks a day)  [x]  Never []   Once or Twice []  Monthly []  Weekly []  Daily or Almost Daily  2) Tobacco Products  []  Never [x]  Once or Twice []  Monthly []  Weekly []  Daily or Almost Daily  3) Prescription Drugs for Non-Medical Reasons  [x]  Never []  Once or Twice []  Monthly []  Weekly []  Daily or Almost Daily  4) Illegal Drugs  [x]  Never []  Once or Twice []  Monthly []  Weekly []  Daily or Almost Daily             Functional Ability/Level of Safety:   Falls Risk/Home Safety Assessment:  ( see HRA and Screenings sections for additional assessment)  Home Safety: []  Stair handrails  [x]  Skid-resistant rugs/remove throw rugs   []  Grab bars  [x]  Clear pathways between rooms  []  Proper lighting stairs/ bathrooms/bedrooms  Get Up and Go (optional):  [x]   <20 secs  []   >20 secs    []   High risk for  falls - Home Safety/Falls Risk Precautions reviewed with pt/family    Hearing Assessment:  Concerns for hearing loss: []  Yes  [x]   No  Hearing aids:   []   Right  []   Left  []   Bilateral   [x]   None  Whisper Test (optional):  []  Normal  []   Slightly decreased  []   Significantly decreased    Exercise:  Frequency:  []   No formal exercise  []   1-2x/wk  [x]   3-4x/wk  []   >4x/wk  Duration:  []   15-30 mins/day  [x]   30-45 mins/day  []   45+ mins/day  Intensity:  []   Light  [x]   Moderate  []   Heavy        Activities of Daily Living:   ADL's Independent Minimal  Assistance Moderate  Assistance Total   Assistance   Bathing [x]  []  []  []    Dressing [x]  []  []  []    Mobility   [x]  []  []  []    Transfer [x]  []  []  []    Eating [x]  []  []  []    Toileting [x]  []  []  []      IADL's Independent Minimal  Assistance Moderate  Assistance Total   Assistance   Phone [x]  []  []  []    Housekeeping [x]  []  []  []    Laundry [x]  []  []  []    Transportation [x]  []  []  []    Medications [x]  []  []  []    Finances []  []  []  [x]       ADL assistance: []  No assistance needed  []  Spouse  []  Sibling  [x]  Son   []  Daughter []  Children  []  Home Health Aide []  Other:       Advance Care Planning:   Discussion of Advance Directives:   []  Advance Directive in chart  [x]  Advance Directive not in chart - requested to provide []  No Advance Directive.  Form Provided  []  No Advance Directive.  Pt declines. []  Not addressed today  []  Other:     Exam:   BP 113/69 (BP Site: Left arm, Patient Position: Sitting, Cuff Size: Medium)   Pulse 76   Temp 98.6 F (37 C) (Tympanic)   Ht 1.456 m (4' 9.32")   Wt 50.5 kg (111 lb 6.4 oz)   BMI 23.84 kg/m      Physical Exam  Vitals and nursing note reviewed.   Constitutional:       Appearance: She is well-developed.   HENT:      Head: Normocephalic and atraumatic.   Cardiovascular:      Rate and Rhythm: Normal rate and regular rhythm.  Heart sounds: Normal heart sounds.   Pulmonary:      Effort: Pulmonary effort is normal.      Breath  sounds: Normal breath sounds.   Skin:     General: Skin is warm and dry.   Neurological:      Mental Status: She is alert and oriented to person, place, and time.   Psychiatric:         Behavior: Behavior normal.         Thought Content: Thought content normal.         Judgment: Judgment normal.             Evaluation of Cognitive Function:   Mood/affect: [x]  Appropriate  []   Other:   Appearance: [x]  Neatly groomed  [x]  Adequately nourished  []  Other:  Family member/caregiver input: []  Present - no concerns  []   Not present in room  []  Present - concerns:    Cognitive Assessment:  Mini-Cog Result (three word registration- banana, sunrise, chair / clock drawing):   []   > 3 points - negative screen for dementia   [x]  3 recalled words - negative screen for dementia   []  1-2 recalled words and normal clock draw - negative for cognitive impairment   []  1-2 recalled words and abnormal clock draw - positive for cognitive impairment   []  0 recalled words - positive for cognitive impairment         Assessment/Plan:   1. Encounter for annual wellness exam in Medicare patient    2. Cervical myelopathy    3. Protein-calorie malnutrition, unspecified severity    4. Chronic obstructive pulmonary disease, unspecified COPD type    5. Thrombocytopenia  - CBC and differential    6. Pure hypercholesterolemia  - Lipid panel  - Comprehensive metabolic panel    7. Lumbar radiculopathy  - Referral to Physical Therapy-[facility] HPLX; Future    8. Left leg pain  - Referral to Physical Therapy-[facility] HPLX; Future         1. Encounter for annual wellness exam in Medicare patient  Normal cognition, increased risk for fall due to ongoing lower body tremors.  Fall precautions discussed in detail    2. Cervical myelopathy  Was seen pain management, but discontinued follow-ups with them    3. Protein-calorie malnutrition, unspecified severity  Encouraged to eat 50 to 60 g of protein daily    4. Chronic obstructive pulmonary disease, unspecified COPD  type  Stable, continues to smoke    5. Thrombocytopenia  Check levels today  - CBC and differential    6. Pure hypercholesterolemia    - Lipid panel  - Comprehensive metabolic panel    7. Lumbar radiculopathy    - Referral to Physical Therapy-[facility] HPLX; Future    8. Left leg pain    - Referral to Physical Therapy-[facility] HPLX; Future    9. Burning sensation of lower extremity  Likely due to lumbar radiculopathy    Alexzia Kasler Berna Spare, NP    12/09/2022     The following sections were reviewed this encounter by the provider:   Tobacco  Allergies  Meds  Problems  Med Hx  Surg Hx  Fam Hx          History:   Patient Active Problem List   Diagnosis    Barrett esophagus    S/P colonoscopic polypectomy    Chronic back pain    Osteopenia    Neck pain    Tremor  Idiopathic thrombocytopenia purpura    COPD (chronic obstructive pulmonary disease)    Tobacco abuse    Hoffman sign present    Hyperlipidemia    Benign neoplasm of rectum    Benign neoplasm of transverse colon    Pain in elbow    Hiatal hernia    Paresthesia of lower extremity    Cervical myelopathy    Positive ANA (antinuclear antibody)    Lumbar radiculopathy    Protein-calorie malnutrition, unspecified severity    Anxiety and depression    Chronic left hip pain      Past Medical History:   Diagnosis Date    Abnormal EKG     Arthralgia     Barrett's esophagus     Cervical spinal stenosis     Colonic polyp     Depression     Disc disorder     Gastroesophageal reflux disease     Hypercholesterolemia     Lower back pain     Neck pain     Numbness and tingling of both legs 02/06/2015    Osteoarthritis     Osteopenia      Past Surgical History:   Procedure Laterality Date    APPENDECTOMY (OPEN)  1960?    when was a teenager    KNEE SURGERY Left     FX patella    TUBAL LIGATION  1973     No Known Allergies   Outpatient Medications Marked as Taking for the 12/09/22 encounter (Office Visit) with Eliezer Lofts, NP   Medication Sig Dispense Refill    aspirin EC 81 MG EC  tablet Take by mouth.      atorvastatin (LIPITOR) 10 MG tablet Take 1 tablet (10 mg) by mouth daily 90 tablet 1    Calcium Carbonate (CALCIUM 600 PO) Take 1 tablet by mouth daily       Social History     Tobacco Use    Smoking status: Every Day     Current packs/day: 0.25     Average packs/day: 0.3 packs/day for 50.0 years (12.5 ttl pk-yrs)     Types: Cigarettes    Smokeless tobacco: Never    Tobacco comments:     5-8 cigarettes per day   Vaping Use    Vaping status: Never Used   Substance Use Topics    Alcohol use: No    Drug use: Never      Family History   Problem Relation Age of Onset    Breast cancer Neg Hx     Ovarian cancer Neg Hx     Cancer Neg Hx            ===================================================================    Additional Documentation:                Have you seen any specialists/other providers since your last visit with [place]?    No    Arm preference verified?   Yes, no preference    Health Maintenance Due   Topic Date Due    Advance Directive on File  Never done    Spirometry Annual  Never done    Tetanus Ten-Year  Never done    Pneumonia Vaccine Age 7+ (2 of 2 - PPSV23 or PCV20) 01/21/2016

## 2022-12-10 LAB — IRON PROFILE
Iron Saturation: 34 % (ref 15–55)
Iron: 94 ug/dL (ref 27–139)
TIBC: 278 ug/dL (ref 250–450)
UIBC: 184 ug/dL (ref 118–369)

## 2022-12-10 LAB — COMPREHENSIVE METABOLIC PANEL
ALT: 15 IU/L (ref 0–32)
AST (SGOT): 20 IU/L (ref 0–40)
Albumin/Globulin Ratio: 2.3 — ABNORMAL HIGH (ref 1.2–2.2)
Albumin: 4.3 g/dL (ref 3.7–4.7)
Alkaline Phosphatase: 77 IU/L (ref 44–121)
BUN / Creatinine Ratio: 24 (ref 12–28)
BUN: 13 mg/dL (ref 8–27)
Bilirubin, Total: 0.4 mg/dL (ref 0.0–1.2)
CO2: 25 mmol/L (ref 20–29)
Calcium: 8.7 mg/dL (ref 8.7–10.3)
Chloride: 102 mmol/L (ref 96–106)
Creatinine: 0.55 mg/dL — ABNORMAL LOW (ref 0.57–1.00)
Globulin, Total: 1.9 g/dL (ref 1.5–4.5)
Glucose: 85 mg/dL (ref 70–99)
Potassium: 4.2 mmol/L (ref 3.5–5.2)
Protein, Total: 6.2 g/dL (ref 6.0–8.5)
Sodium: 140 mmol/L (ref 134–144)
eGFR: 91 mL/min/{1.73_m2} (ref >59)

## 2022-12-10 LAB — CBC AND DIFFERENTIAL
Baso(Absolute): 0 10*3/uL (ref 0.0–0.2)
Basophils Automated: 1 %
Eosinophils Absolute: 0.1 10*3/uL (ref 0.0–0.4)
Eosinophils Automated: 1 %
Hematocrit: 39.3 % (ref 34.0–46.6)
Hemoglobin: 12.5 g/dL (ref 11.1–15.9)
Immature Granulocytes Absolute: 0 10*3/uL (ref 0.0–0.1)
Immature Granulocytes: 0 %
Lymphocytes Absolute: 1.1 10*3/uL (ref 0.7–3.1)
Lymphocytes Automated: 30 %
MCH: 29.2 pg (ref 26.6–33.0)
MCHC: 31.8 g/dL (ref 31.5–35.7)
MCV: 92 fL (ref 79–97)
Monocytes Absolute: 0.3 10*3/uL (ref 0.1–0.9)
Monocytes: 8 %
Neutrophils Absolute Count: 2.1 10*3/uL (ref 1.4–7.0)
Neutrophils: 60 %
Platelets: 145 10*3/uL — ABNORMAL LOW (ref 150–450)
RBC: 4.28 x10E6/uL (ref 3.77–5.28)
RDW: 13.6 % (ref 11.7–15.4)
WBC: 3.5 10*3/uL (ref 3.4–10.8)

## 2022-12-10 LAB — LIPID PANEL
Cholesterol / HDL Ratio: 2.3 ratio (ref 0.0–4.4)
Cholesterol: 166 mg/dL (ref 100–199)
HDL: 71 mg/dL (ref >39)
LDL Chol Calculated (NIH): 84 mg/dL (ref 0–99)
Triglycerides: 52 mg/dL (ref 0–149)
VLDL Calculated: 11 mg/dL (ref 5–40)

## 2022-12-11 LAB — VITAMIN B12: Vitamin B-12: 458 pg/mL (ref 232–1245)

## 2022-12-24 ENCOUNTER — Encounter (INDEPENDENT_AMBULATORY_CARE_PROVIDER_SITE_OTHER): Payer: Self-pay | Admitting: Family

## 2022-12-25 ENCOUNTER — Encounter (INDEPENDENT_AMBULATORY_CARE_PROVIDER_SITE_OTHER): Payer: Self-pay | Admitting: Family

## 2023-01-09 ENCOUNTER — Telehealth (INDEPENDENT_AMBULATORY_CARE_PROVIDER_SITE_OTHER): Payer: Self-pay | Admitting: Family

## 2023-01-09 NOTE — Telephone Encounter (Signed)
Pt is requesting for colonoscopy referral     Please advise   Call back #  [phone]

## 2023-01-12 ENCOUNTER — Ambulatory Visit (INDEPENDENT_AMBULATORY_CARE_PROVIDER_SITE_OTHER): Payer: Medicare Other | Admitting: Family

## 2023-01-13 ENCOUNTER — Encounter (INDEPENDENT_AMBULATORY_CARE_PROVIDER_SITE_OTHER): Payer: Self-pay | Admitting: Family

## 2023-01-13 ENCOUNTER — Ambulatory Visit (INDEPENDENT_AMBULATORY_CARE_PROVIDER_SITE_OTHER): Payer: Medicare Other | Admitting: Family

## 2023-01-13 VITALS — BP 105/65 | HR 76 | Temp 97.5°F | Ht <= 58 in | Wt 113.0 lb

## 2023-01-13 DIAGNOSIS — M542 Cervicalgia: Secondary | ICD-10-CM

## 2023-01-13 DIAGNOSIS — Z23 Encounter for immunization: Secondary | ICD-10-CM

## 2023-01-13 DIAGNOSIS — M629 Disorder of muscle, unspecified: Secondary | ICD-10-CM

## 2023-01-13 DIAGNOSIS — E785 Hyperlipidemia, unspecified: Secondary | ICD-10-CM

## 2023-01-13 DIAGNOSIS — G8929 Other chronic pain: Secondary | ICD-10-CM

## 2023-01-13 NOTE — Progress Notes (Unsigned)
Have you seen any specialists/other providers since your last visit with [place]?    No    Arm preference verified?   Yes, no preference    Health Maintenance Due   Topic Date Due    Advance Directive on File  Never done    Spirometry Annual  Never done    Tetanus Ten-Year  Never done    Pneumonia Vaccine Age 83+ (2 of 2 - PPSV23 or PCV20) 01/21/2016

## 2023-01-13 NOTE — Progress Notes (Unsigned)
Subjective:      Patient ID: Michele [name] is a 82 y.o. female     Chief Complaint   Patient presents with    Referral To Another Service     Physical therapy    Stool Color Change     Pt reports stool colored dark red   Pt unsure if blood  Pt reports eating beets often        Neck Pain   This is a chronic problem. The problem occurs constantly. The problem has been unchanged. The pain is associated with nothing. The pain is present in the left side, midline and right side. The quality of the pain is described as aching. The pain is moderate. The symptoms are aggravated by position. Pertinent negatives include no chest pain, fever, headaches, numbness or weakness. She has tried home exercises for the symptoms. The treatment provided mild relief.      Pt states that she ate some beets  a week ago,and she noticed change in her stool color after eating them. Voices concern over  need for colonoscopy. Stool color returned to normal now.  Last colonoscopy February 13, 2020 with Dr. Gwenlyn Perking who removed 3 polyps at the time which were tubular adenomatous polyps, per Dr. Gwenlyn Perking she does not require additional colonoscopies as she will be aged out for for screening.  Additionally pt is requesting PT for her neck pain which is a chronic condition.  She also would like to receive additional PT for her hip and leg     Problem   Hyperlipidemia      Lab Results   Component Value Date    CHOL 166 12/09/2022    CHOL 170 11/26/2021    CHOL 162 06/04/2021     Lab Results   Component Value Date    HDL 71 12/09/2022    HDL 76 11/26/2021    HDL 71 06/04/2021     Lab Results   Component Value Date    LDL 84 12/09/2022    LDL 84 11/26/2021    LDL 80 06/04/2021     Lab Results   Component Value Date    TRIG 52 12/09/2022    TRIG 49 11/26/2021    TRIG 53 06/04/2021     Currently on atorvastatin 10 mg+ takes 81 mg ASA            The following sections were reviewed this encounter by the provider:   Tobacco  Allergies  Meds  Problems  Med  Hx  Surg Hx  Fam Hx         Review of Systems   Constitutional:  Negative for appetite change, chills, fatigue and fever.   HENT:  Negative for congestion and sore throat.    Respiratory:  Negative for cough, chest tightness, shortness of breath and wheezing.    Cardiovascular:  Negative for chest pain, palpitations and leg swelling.   Gastrointestinal:  Negative for diarrhea, nausea and vomiting.   Musculoskeletal:  Positive for neck pain and neck stiffness. Negative for arthralgias.   Neurological:  Positive for tremors (chronic). Negative for dizziness, seizures, syncope, facial asymmetry, speech difficulty, weakness, light-headedness, numbness and headaches.          BP 105/65 (BP Site: Left arm, Patient Position: Sitting, Cuff Size: Medium)   Pulse 76   Temp 97.5 F (36.4 C) (Tympanic)   Ht 1.455 m (4' 9.3")   Wt 51.3 kg (113 lb)   BMI 24.20 kg/m  Objective:     Physical Exam  Vitals and nursing note reviewed.   Constitutional:       Appearance: Normal appearance. She is well-developed.   HENT:      Head: Normocephalic and atraumatic.   Neck:     Cardiovascular:      Rate and Rhythm: Normal rate and regular rhythm.      Heart sounds: Normal heart sounds.   Pulmonary:      Effort: Pulmonary effort is normal.      Breath sounds: Normal breath sounds.   Musculoskeletal:      Cervical back: Pain with movement and muscular tenderness present. Decreased range of motion.   Skin:     General: Skin is warm and dry.   Neurological:      General: No focal deficit present.      Mental Status: She is alert and oriented to person, place, and time.   Psychiatric:         Mood and Affect: Mood normal.         Behavior: Behavior normal.         Thought Content: Thought content normal.         Judgment: Judgment normal.          Assessment:     1. Chronic neck pain  - Referral to Physical Therapy-[facility] HPLX; Future    2. Musculoskeletal disorder involving upper trapezius muscle  - Referral to Physical Therapy-IP[facility]  HPLX; Future    3. Hyperlipidemia, unspecified hyperlipidemia type  - atorvastatin (LIPITOR) 10 MG tablet; Take 1 tablet (10 mg) by mouth daily  Dispense: 100 tablet; Refill: 2        Plan:   Assurance given that she does not need to colonoscopy, since her stool color  is normal now.  1,2) referral to PT is given    3) Chronic, stable  Continue with current medication        Medication list reviewed with patient and updated as indicated.  Risk & Benefits of any new medication(s) were explained to the patient who verbalized understanding & agreed to the treatment plan.  All questions are answered.Patient verbalized understanding of instructions given.      [name], NP

## 2023-01-14 MED ORDER — ATORVASTATIN CALCIUM 10 MG PO TABS
10.0000 mg | ORAL_TABLET | Freq: Every day | ORAL | 2 refills | Status: AC
Start: 2023-01-14 — End: ?

## 2023-03-12 ENCOUNTER — Inpatient Hospital Stay: Payer: Medicare Other | Admitting: Rehabilitative and Restorative Service Providers"

## 2023-03-16 ENCOUNTER — Inpatient Hospital Stay: Payer: Medicare Other | Attending: Family | Admitting: Rehabilitative and Restorative Service Providers"

## 2023-03-16 ENCOUNTER — Encounter: Payer: Self-pay | Admitting: Rehabilitative and Restorative Service Providers"

## 2023-03-16 ENCOUNTER — Inpatient Hospital Stay: Payer: Medicare Other | Admitting: Rehabilitative and Restorative Service Providers"

## 2023-03-16 VITALS — BP 120/55 | HR 62

## 2023-03-16 DIAGNOSIS — M542 Cervicalgia: Secondary | ICD-10-CM | POA: Insufficient documentation

## 2023-03-16 DIAGNOSIS — M79605 Pain in left leg: Secondary | ICD-10-CM | POA: Insufficient documentation

## 2023-03-16 DIAGNOSIS — M5416 Radiculopathy, lumbar region: Secondary | ICD-10-CM | POA: Insufficient documentation

## 2023-03-16 NOTE — Progress Notes (Signed)
Name:Michele [name] Age: 82 y.o.   Date of Service: 03/16/2023  Referring Physician: Eliezer Lofts, NP   Date of Injury: No data found 01/13/2023  PT Date Care Plan Established/Reviewed:03/16/2023  PT Date Treatment Started:03/16/2023  OT Date Care Plan Established/Reviewed: No data found  OT Date Treatment Started: No data found    (Historic) Date of Injury:No data was found  (Historic) Date Care Plan Established/Reviewed No data was found  No data was found  (Historic) Date Treatment Started No data was found No data was found    End of Certification Date: 06/13/2023  Sessions in Plan of Care: 16  Surgery Date: No data was found  MD Follow-up: No data was found  Medbridge Code: No data was found    Visit Count: 1   Diagnosis:    Diagnosis ICD-10-CM Associated Order   1. Neck pain  M54.2           Subjective     History of Present Illness   History of Present Illness: History: Pt presents to physical therapy with Neck and BIL shoulder pain starting a year ago. Has been getting worse for the last 6 months. Insidious onset. Has trouble lifting up arm. Had neck pain before, but now its worse. Feels stiff. States her shoulders always bothered her, but its worse now for the last 6-7 months. Worse in the morning, slowly loosens up during the day. Pain to Rt arm from forearm and into lateral arm. Constant. Constant neck and shoulder pain    Pain (location/type/mechanism): Lower neck and UT and into lateral shoulders. Stabbing pain. Achy sometimes. Insidious onset. Symptoms worsening over last 6 months    Aggravators: housework, difficulty putting dishes away, Looking up, Sleeping  Easers: none    Imaging: None    PMH: see chart for full medical history    PLOF: See below    Red Flags: Denies numbness in tingling    Goals: to feel better - unable to state a functional goal after cueing    Functional Limitations (PLOF): Pain with laying down at night, but able to sleep (previously less pain with laying down)  Difficulty  performing house work such as vacuuming/mopping (had less difficulty with housework)    Outcome Measure   Tool Used/Details: FOTO  Score: 39  Predicted Functional Outcome: 51    Social Support/Occupation    Lives in: multiple level home  community based residential facility    Lives with: adult children    Occupation: Retired           Precautions: No data was found  Allergies: Patient has no known allergies.    Medical History[1]    Objective     Active Range of Motion (degrees)   Cervical/Thoracic Spine   Cervical    Flexion: 33 degrees   Extension: 42 degrees   Left lateral flexion: 15 degrees   Right lateral flexion: 22 degrees   Left rotation: 50 degrees   Right rotation: 52 degrees     Left Shoulder   Flexion: 132 degrees   External rotation BTH: T3   Internal rotation BTB: T10     Right Shoulder   Flexion: 125 degrees   External rotation BTH: T3   Internal rotation BTB: T10 with pain    Additional Active Range of Motion Details  Non painful cervical AROM in all planes    Passive Range of Motion (degrees)      Left Shoulder   External rotation 90: WFL  Internal rotation 90: WFL    Right Shoulder   External rotation 90: WFL and with pain  Internal rotation 90: WFL and with pain    Strength/Myotome Testing (/5)     Left Shoulder     Planes of Motion   Flexion: 4 and with pain   External rotation at 90: 4+   Internal rotation at 0: 5     Right Shoulder     Planes of Motion   Flexion: 4- and with pain   External rotation at 90: 3+ and with pain   Internal rotation at 0: 4+ and with pain             TTP to Rt UT @ T3     BP: 120/55 Heart Rate: 62    Treatment     Therapeutic Exercises - Justified to address any of the following:  To develop strength, endurance, ROM and/or flexibility.   -Pt educated on symptoms and potential causes of symptoms with involved anatomy.   -HEP reviewed and discussed reps and importance of performing.  -Discussed POC, diagnosis, prognosis, and frequency of visits needed. Pt  consents to plan.    Seated scap retractions 10 reps    SL ER 0# 10 reps with cues for setup       ---      Flowsheet Row ---   Total Time    Timed Minutes 5 minutes   Untimed Minutes 30 minutes   Total Time 35 minutes          Assessment   Keanne is a 82 y.o. female presenting with Neck pain and Rt shoulder pain and muscle weaknesswho requires skilled Physical Therapy services.    Clinical presentation: stable - predictable recovery pattern  Barriers to therapy: Time since onset of injury/illness/exacerbation - resulting in multi-joint involvement       Functional Limitations (PLOF): Pain with laying down at night, but able to sleep (previously less pain with laying down)  Difficulty performing house work such as vacuuming/mopping (had less difficulty with housework)  Prognosis: excellent  Patient is aware of diagnosis, prognosis and consents to plan of care: Yes  Plan   Visits per week: 2  Number of Sessions: 16  Direct One on One  40981: Therapeutic Exercise: To Develop Strength and Endurance, ROM and Flexibility  [id number]: Gait Training  19147: Neuromuscular Reeducation (Proprioceptive Neuromuscular Facilitation)  97140: Manual Therapy techniques (mobilization, manipulation, manual traction) (Grade I-V to cervical spine, thoracic spine, shoulder girdle, and regionally interdependent joints, soft tissue mobilization, instrument assisted soft tissue mobilization.)  [phone]: Iontophoresis  97530: Therapeutic Activities: Dynamic activities to improve functional performance  21308: Ultrasound  Dry Needling  Supervised Modalities  97010: Thermal modalities: hot/cold packs  65784: Electrical stimulation  97016: Vasopneumatic devices  Plan for Next Session -: cervical isometrics, rows, consider gentle manual (grade I mobs) or STM, assess compression/spurlings, assess elbow/wrist strength, f/u HEP      Goals      Goal 1: Pt will be able to demo HEP with zero to min cuing with good form to allow for independent performance at  home and return to PLOF    Access Code: [email or web address]  URL: [email or web address]  Date: 03/16/2023  Prepared by: Brytni Dray    Exercises  - Seated Scapular Retraction  - 1 x daily - 3 sets - 10 reps  - Sidelying Shoulder External Rotation  - 1 x daily - 3 sets - 15 reps   Sessions: 16  Goal 2: Pt will score >/= 51/100 on FOTO FS Primary measure vs 39/100 @ IE, demonstrating improved functional mobility for return to PLOF     Sessions: 16      Goal 3: Patient will demonstrate 4+/5 MMT of ER to improve dynamic stability to allow patient to place dishes in overhead cabinets without increase in pain     Sessions: 6      Goal 4: Patient will demonstrate 5/5 MMT of flexion to improve dynamic stability to allow patient to lift dishes into shelves without excessive effort       Sessions: 16                                  Tamarick Kovalcik, DPT      Parts of this note were generated by the Epic EMR system/Dragon speech recognition and may contain inherent errors or omissions not intended by the user. Grammatical errors, random word insertions, deletions, pronoun errors and incomplete sentences are occasional consequences of this technology due to software limitations. Not all errors are caught or corrected.  If there are questions or concerns about the content of this note or information contained within the body of this dictation they should be addressed directly with the author for clarification.         [1]   Past Medical History:  Diagnosis Date    Abnormal EKG     Arthralgia     Barrett's esophagus     Cervical spinal stenosis     Colonic polyp     Depression     Disc disorder     Gastroesophageal reflux disease     Hypercholesterolemia     Lower back pain     Neck pain     Numbness and tingling of both legs 02/06/2015    Osteoarthritis     Osteopenia

## 2023-03-19 ENCOUNTER — Inpatient Hospital Stay: Payer: Medicare Other | Attending: Family | Admitting: Rehabilitative and Restorative Service Providers"

## 2023-03-19 DIAGNOSIS — G8929 Other chronic pain: Secondary | ICD-10-CM | POA: Insufficient documentation

## 2023-03-19 DIAGNOSIS — M542 Cervicalgia: Secondary | ICD-10-CM | POA: Insufficient documentation

## 2023-03-19 DIAGNOSIS — M629 Disorder of muscle, unspecified: Secondary | ICD-10-CM | POA: Insufficient documentation

## 2023-03-19 NOTE — PT/OT Therapy Note (Signed)
Name: Michele [name] Age: 82 y.o.   Date of Service: 03/19/2023  Referring Physician: Eliezer Lofts, NP   Date of Injury: No data found 01/13/2023  PT Date Care Plan Established/Reviewed:03/16/2023  PT Date Treatment Started:03/16/2023  OT Date Care Plan Established/Reviewed: No data found  OT Date Treatment Started: No data found    (Historic) Date of Injury:No data was found  (Historic) Date Care Plan Established/Reviewed No data was found  No data was found  (Historic) Date Treatment Started No data was found No data was found    End of Certification Date: 06/13/2023  Sessions in Plan of Care: 16  Surgery Date: No data was found  MD Follow-up: No data was found  Medbridge Code: No data was found    Visit Count: 2   Diagnosis:    Diagnosis ICD-10-CM Associated Order   1. Neck pain  M54.2                Subjective     Daily Subjective   Pt reports that she tried the exercises and they went okay. Feels like she can lift her arm a little easier. Neck feels stiff.    Social Support/Occupation    Lives in: multiple level home  community based residential facility    Lives with: adult children    Occupation: Retired           Precautions: No data was found  Allergies: Patient has no known allergies.    Objective                     Treatment     Therapeutic Exercises - Justified to address any of the following:  To develop strength, endurance, ROM and/or flexibility.   -Time spent for subjective intake, objective measures, and review of HEP    Prone scap retractions 5 reps for 10 seconds with cues for scapular retraction    BIL ER with RTB 2 sets of 15 reps with cues for setup    Review of SL ER with cues for setup and keeping elbow to side    Neuromuscular Re-Education - Justified to address any of the following:   Re-education of movement, balance, coordination, kinesthetic sense, posture and/or proprioception for sitting and/or standing activities.   Cervical extension isometrics with ball at wall with cues for setup and  intensity 5 reps for 10 seconds of each to improve cervical proprioception    Chin tuck lift and holds 5 reps for 10 seconds to improve cervical proprioception    Prone chin tuck lift and holds  5 reps for 10 seconds to improve cervical proprioception      Manual Therapy - Justified to address any of the following:    Mobilization of joints and soft tissues, manipulation, manual lymphatic drainage, and/or manual traction.    Cervical PA mobilizations grade I-II in supine    STM throughout cervical paraspinals, UT, levator scap, and surrounding myofascia       Therapeutic Activity - Justified to address the following:  Dynamic activities to improve functional performance.  Prone T 0# 10 reps to improve overhead reaching with cues for setup    T-band rows with blue CLX band  20 reps to improve functional pulling performance    Wall push ups 15 reps with cues for hand height to improve pushing and weight bearing performance       ---      Flowsheet Row ---   Total Time  Timed Minutes 40 minutes   Total Time 40 minutes          Assessment   Pt presents to physical therapy with continued neck and shoulder pain. Pt demonstrates pain with cervical rotation AROM and extension, however reported less pain following manual therapy and NMR interventions today. Pt was progressed with shoulder stability interventions and had less pain with shoulder flexion. Pt had pain with prone T positioning and reported pain during, but still had improved overhead mobility afterwards. Pt can continue to benefit from physical therapy to decrease pain and return to prior level of function.    Plan   Continue cervical stability  Manual therapy, as tolerated  Shoulder strengthening/ER stability as tolerated      Goals      Goal 1: Pt will be able to demo HEP with zero to min cuing with good form to allow for independent performance at home and return to PLOF    Access Code: [email or web address]  URL: [email or web address]  Date:  03/16/2023  Prepared by: Xitlalic Maslin    Exercises  - Seated Scapular Retraction  - 1 x daily - 3 sets - 10 reps  - Sidelying Shoulder External Rotation  - 1 x daily - 3 sets - 15 reps   Sessions: 16      Goal 2: Pt will score >/= 51/100 on FOTO FS Primary measure vs 39/100 @ IE, demonstrating improved functional mobility for return to PLOF     Sessions: 16      Goal 3: Patient will demonstrate 4+/5 MMT of ER to improve dynamic stability to allow patient to place dishes in overhead cabinets without increase in pain     Sessions: 6      Goal 4: Patient will demonstrate 5/5 MMT of flexion to improve dynamic stability to allow patient to lift dishes into shelves without excessive effort       Sessions: 16                                  Pride Gonzales, DPT      Parts of this note were generated by the Epic EMR system/Dragon speech recognition and may contain inherent errors or omissions not intended by the user. Grammatical errors, random word insertions, deletions, pronoun errors and incomplete sentences are occasional consequences of this technology due to software limitations. Not all errors are caught or corrected.  If there are questions or concerns about the content of this note or information contained within the body of this dictation they should be addressed directly with the author for clarification.

## 2023-03-23 ENCOUNTER — Inpatient Hospital Stay: Payer: Medicare Other | Admitting: Rehabilitative and Restorative Service Providers"

## 2023-03-24 NOTE — PT/OT Therapy Note (Deleted)
Name: Michele [name] Age: 82 y.o.   Date of Service: 03/25/2023  Referring Physician: Eliezer Lofts, NP   Date of Injury: No data found No data found  PT Date Care Plan Established/Reviewed:No data found  PT Date Treatment Started:No data found  OT Date Care Plan Established/Reviewed: No data found  OT Date Treatment Started: No data found    (Historic) Date of Injury:No data was found  (Historic) Date Care Plan Established/Reviewed No data was found  No data was found  (Historic) Date Treatment Started No data was found No data was found    End of Certification Date: No data was found  Sessions in Plan of Care: No data was found  Surgery Date: No data was found  MD Follow-up: No data was found  Medbridge Code: No data was found    Visit Count: Visit count could not be calculated. Make sure you are using a visit which is associated with an episode.   Diagnosis:   No diagnosis found.           Subjective     Daily Subjective   Pt reports that she tried the exercises and they went okay. Feels like she can lift her arm a little easier. Neck feels stiff.    Social Support/Occupation    Lives in: multiple level home  community based residential facility    Lives with: adult children    Occupation: Retired           Precautions: No data was found  Allergies: Patient has no known allergies.    Objective                     Treatment     Therapeutic Exercises - Justified to address any of the following:  To develop strength, endurance, ROM and/or flexibility.   -Time spent for subjective intake, objective measures, and review of HEP    Prone scap retractions 5 reps for 10 seconds with cues for scapular retraction    BIL ER with RTB 2 sets of 15 reps with cues for setup    Review of SL ER with cues for setup and keeping elbow to side    Neuromuscular Re-Education - Justified to address any of the following:   Re-education of movement, balance, coordination, kinesthetic sense, posture and/or proprioception for sitting  and/or standing activities.   Cervical extension isometrics with ball at wall with cues for setup and intensity 5 reps for 10 seconds of each to improve cervical proprioception    Chin tuck lift and holds 5 reps for 10 seconds to improve cervical proprioception    Prone chin tuck lift and holds  5 reps for 10 seconds to improve cervical proprioception      Manual Therapy - Justified to address any of the following:    Mobilization of joints and soft tissues, manipulation, manual lymphatic drainage, and/or manual traction.    Cervical PA mobilizations grade I-II in supine    STM throughout cervical paraspinals, UT, levator scap, and surrounding myofascia     Continue cervical stability  Manual therapy, as tolerated  Shoulder strengthening/ER stability as tolerated    Therapeutic Activity - Justified to address the following:  Dynamic activities to improve functional performance.  Prone T 0# 10 reps to improve overhead reaching with cues for setup    T-band rows with blue CLX band  20 reps to improve functional pulling performance    Wall push ups 15 reps with  cues for hand height to improve pushing and weight bearing performance                      Reginal Lutes, DPT      Parts of this note were generated by the Epic EMR system/Dragon speech recognition and may contain inherent errors or omissions not intended by the user. Grammatical errors, random word insertions, deletions, pronoun errors and incomplete sentences are occasional consequences of this technology due to software limitations. Not all errors are caught or corrected.  If there are questions or concerns about the content of this note or information contained within the body of this dictation they should be addressed directly with the author for clarification.

## 2023-03-25 ENCOUNTER — Inpatient Hospital Stay: Payer: Medicare Other | Admitting: Rehabilitative and Restorative Service Providers"

## 2023-03-30 ENCOUNTER — Inpatient Hospital Stay: Payer: Medicare Other | Admitting: Rehabilitative and Restorative Service Providers"

## 2023-04-01 ENCOUNTER — Inpatient Hospital Stay: Payer: Medicare Other

## 2023-04-06 ENCOUNTER — Inpatient Hospital Stay: Payer: Medicare Other | Admitting: Rehabilitative and Restorative Service Providers"

## 2023-04-09 ENCOUNTER — Inpatient Hospital Stay: Payer: Medicare Other | Attending: Family | Admitting: Rehabilitative and Restorative Service Providers"

## 2023-04-09 DIAGNOSIS — M542 Cervicalgia: Secondary | ICD-10-CM | POA: Insufficient documentation

## 2023-04-09 NOTE — PT/OT Therapy Note (Signed)
Name: Michele [name] Age: 82 y.o.   Date of Service: 04/09/2023  Referring Physician: Eliezer Lofts, NP   Date of Injury: No data found 01/13/2023  PT Date Care Plan Established/Reviewed:03/16/2023  PT Date Treatment Started:03/16/2023  OT Date Care Plan Established/Reviewed: No data found  OT Date Treatment Started: No data found    (Historic) Date of Injury:No data was found  (Historic) Date Care Plan Established/Reviewed No data was found  No data was found  (Historic) Date Treatment Started No data was found No data was found    End of Certification Date: 06/13/2023  Sessions in Plan of Care: 16  Surgery Date: No data was found  MD Follow-up: No data was found  Medbridge Code: No data was found    Visit Count: 3   Diagnosis:    Diagnosis ICD-10-CM Associated Order   1. Neck pain  M54.2                Subjective     Daily Subjective   Pt reports that she feels the same. Says that she did the exercises once day. Sometimes she feels a little better after doing the exercises. Movement feels a little easier.     Social Support/Occupation    Lives in: multiple level home  community based residential facility    Lives with: adult children    Occupation: Retired           Precautions: No data was found  Allergies: Patient has no known allergies.    Objective                       Treatment     Therapeutic Exercises - Justified to address any of the following:  To develop strength, endurance, ROM and/or flexibility.   -Time spent for subjective intake, objective measures, and review of HEP    Supine chin tucks 5 reps for 10 seconds with cues for setup    Doorway pec stretch 5 reps for 10 seconds with cues for setup    Neuromuscular Re-Education - Justified to address any of the following:   Re-education of movement, balance, coordination, kinesthetic sense, posture and/or proprioception for sitting and/or standing activities.   Chin tuck lift and holds 5 reps for 10 seconds to improve cervical proprioception    Prone chin  tuck lift and holds 5 reps for 10 seconds to improve cervical proprioception    Manually resisted SL ER with cues for slow tempo 10 reps to improve shoulder proprioception    Manually resisted sidelying row with scapular retraction with cues for slow tempo 10 reps to improve shoulder proprioception    Manually resisted full can in side lying with cues for slow tempo 10 reps to improve shoulder proprioception    - shoulder NMR performed BIL      Manual Therapy - Justified to address any of the following:    Mobilization of joints and soft tissues, manipulation, manual lymphatic drainage, and/or manual traction.    Cervical PA mobilizations grade I-II in supine    Lateral cervical mobilizations grade I     STM throughout cervical paraspinals, UT, levator scap, and surrounding myofascia     Manual cervical distraction    Therapeutic Activity - Justified to address the following:  Dynamic activities to improve functional performance.  Prone Ts 0# with cues for setup 15 reps to improve overhead reaching    Prone Ys 0# with cues for setup 10 reps to  improve overhead reaching    Trialed prone Ws -held    TRX rows with cues for scapular retraction 15 reps to improve functional pulling performance       ---      Flowsheet Row ---   Total Time    Timed Minutes 36 minutes   Total Time 36 minutes          Assessment   Pt presents to physical therapy with continued neck pain. Pt demonstrates pain in Lt lower cervical with AROM in all planes, however reported less pain following therapeutic exercises. Pt demonstrates limitations in cervical AROM in all planes. Pt was continued with cervical mobility and stability interventions with no reports of pain. Pt can continue to benefit from physical therapy to decrease pain and return to prior level of function.    Plan   Continue cervical mobility interventions, as tolerated - consider SNAGs        Goals      Goal 1: Pt will be able to demo HEP with zero to min cuing with good form to  allow for independent performance at home and return to PLOF    Access Code: [email or web address]  URL: [email or web address]  Date: 03/16/2023  Prepared by: Chole Driver    Exercises  - Seated Scapular Retraction  - 1 x daily - 3 sets - 10 reps  - Sidelying Shoulder External Rotation  - 1 x daily - 3 sets - 15 reps   Sessions: 16      Goal 2: Pt will score >/= 51/100 on FOTO FS Primary measure vs 39/100 @ IE, demonstrating improved functional mobility for return to PLOF     Sessions: 16      Goal 3: Patient will demonstrate 4+/5 MMT of ER to improve dynamic stability to allow patient to place dishes in overhead cabinets without increase in pain     Sessions: 6      Goal 4: Patient will demonstrate 5/5 MMT of flexion to improve dynamic stability to allow patient to lift dishes into shelves without excessive effort       Sessions: 16                                  Shanikka Wonders, DPT      Parts of this note were generated by the Epic EMR system/Dragon speech recognition and may contain inherent errors or omissions not intended by the user. Grammatical errors, random word insertions, deletions, pronoun errors and incomplete sentences are occasional consequences of this technology due to software limitations. Not all errors are caught or corrected.  If there are questions or concerns about the content of this note or information contained within the body of this dictation they should be addressed directly with the author for clarification.

## 2023-04-13 ENCOUNTER — Inpatient Hospital Stay: Payer: Medicare Other | Attending: Family

## 2023-04-13 DIAGNOSIS — M542 Cervicalgia: Secondary | ICD-10-CM | POA: Insufficient documentation

## 2023-04-13 NOTE — PT/OT Therapy Note (Signed)
Name: Michele [name] Age: 82 y.o.   Date of Service: 04/13/2023  Referring Physician: Eliezer Lofts, NP   Date of Injury: No data found 01/13/2023  PT Date Care Plan Established/Reviewed:03/16/2023  PT Date Treatment Started:03/16/2023  OT Date Care Plan Established/Reviewed: No data found  OT Date Treatment Started: No data found    (Historic) Date of Injury:No data was found  (Historic) Date Care Plan Established/Reviewed No data was found  No data was found  (Historic) Date Treatment Started No data was found No data was found    End of Certification Date: 06/13/2023  Sessions in Plan of Care: 16  Surgery Date: No data was found  MD Follow-up: No data was found  Medbridge Code: No data was found    Visit Count: 4   Diagnosis:    Diagnosis ICD-10-CM Associated Order   1. Neck pain  M54.2                Subjective     Daily Subjective   Pt reports her HEP has been feeling good to do although, doing the ones on her stomach are the most challenging. She mentioned her R shoulder has been challenging to use when reaching up for things or reaching back to wash her hair. Pt states her neck is feeling tight as well today.    Social Support/Occupation    Lives in: multiple level home  community based residential facility    Lives with: adult children    Occupation: Retired           Precautions: No data was found  Allergies: Patient has no known allergies.    Objective                       Treatment     Therapeutic Exercises - Justified to address any of the following:  To develop strength, endurance, ROM and/or flexibility.   Subjective intake to determine course of treatment today     Reviewed HEP w/ emphasis in proper form to inhibit discomfort and compensations    Supine C/S rot. To L and R w/ pt hand at cheek for overpressure 5 sec holds, 5 reps 2 sets and cues in tempo and proper form    SNAG's w/ use of towel roll and cues in maintaining proper form and alignment with direction of movement 2 sets of 5 reps and 3 sec  holds. each rot. To L and R at C/S    Alt. UE swimmers in hooklying supine, cues in coordination of movement and no shoulder hike 2 sets of 5 reps    (Extensive time taken to complete)    Neuromuscular Re-Education - Justified to address any of the following:   Re-education of movement, balance, coordination, kinesthetic sense, posture and/or proprioception for sitting and/or standing activities.   Prone chin tuck lift and holds 5 reps for 10 seconds to improve cervical proprioception    In supine:  Bil. Horizontal abduction w/ yellow TB 3 sets of 5 reps, cues in tempo and no shoulder hike    Manual Therapy - Justified to address any of the following:    Mobilization of joints and soft tissues, manipulation, manual lymphatic drainage, and/or manual traction.    In supine:  STM along bil. C/S paraspinals, suboccipitals, levator scap.    TPR along bil. UT, cues in breathing    Bil. Cervical P/A mobilizations grade I/II     Bil. Lateral cervical mobilizations grade  I     PTA Manual C/S gentle distraction    In prone:  STM along bil. T/S paraspinals, infraspinatus, rhomboids, cues in breathing    (Extensive time taken to complete)    Therapeutic Activity - Justified to address the following:  Dynamic activities to improve functional performance.  Prone W's 2 sets of 5 reps and 3 sec holds, cues in proper form and tempo       ---      Flowsheet Row ---   Total Time    Timed Minutes 43 minutes   Total Time 43 minutes          Assessment   Pt noted tenderness to palpation along bil. UT and T/S paraspinals today however, tenderness decreased after manual tx. She noted most challenge with prone W's and noted fatigue along R > L UE. She will continue to benefit from skilled PT services to address deficits in C/S ROM, stabilization and UE strength to achieve goals listed below.  Plan   F/U on HEP and resume C/S mobility and UE strengthening exercises per pt tolerance next visit.      Goals      Goal 1: Pt will be able to demo  HEP with zero to min cuing with good form to allow for independent performance at home and return to PLOF    Access Code: [email or web address]  URL: [email or web address]  Date: 03/16/2023  Prepared by: Tommy Dishion    Exercises  - Seated Scapular Retraction  - 1 x daily - 3 sets - 10 reps  - Sidelying Shoulder External Rotation  - 1 x daily - 3 sets - 15 reps   Sessions: 16      Goal 2: Pt will score >/= 51/100 on FOTO FS Primary measure vs 39/100 @ IE, demonstrating improved functional mobility for return to PLOF     Sessions: 16      Goal 3: Patient will demonstrate 4+/5 MMT of ER to improve dynamic stability to allow patient to place dishes in overhead cabinets without increase in pain     Sessions: 6      Goal 4: Patient will demonstrate 5/5 MMT of flexion to improve dynamic stability to allow patient to lift dishes into shelves without excessive effort       Sessions: [address] [place], LPTA      Parts of this note were generated by the Epic EMR system/Dragon speech recognition and may contain inherent errors or omissions not intended by the user. Grammatical errors, random word insertions, deletions, pronoun errors and incomplete sentences are occasional consequences of this technology due to software limitations. Not all errors are caught or corrected.  If there are questions or concerns about the content of this note or information contained within the body of this dictation they should be addressed directly with the author for clarification.

## 2023-04-15 ENCOUNTER — Inpatient Hospital Stay: Payer: Medicare Other | Attending: Family

## 2023-04-15 DIAGNOSIS — M542 Cervicalgia: Secondary | ICD-10-CM | POA: Insufficient documentation

## 2023-04-15 NOTE — PT/OT Therapy Note (Signed)
Name: Michele [name] Age: 82 y.o.   Date of Service: 04/15/2023  Referring Physician: Eliezer Lofts, NP   Date of Injury: No data found 01/13/2023  PT Date Care Plan Established/Reviewed:03/16/2023  PT Date Treatment Started:03/16/2023  OT Date Care Plan Established/Reviewed: No data found  OT Date Treatment Started: No data found    (Historic) Date of Injury:No data was found  (Historic) Date Care Plan Established/Reviewed No data was found  No data was found  (Historic) Date Treatment Started No data was found No data was found    End of Certification Date: 06/13/2023  Sessions in Plan of Care: 16  Surgery Date: No data was found  MD Follow-up: No data was found  Medbridge Code: No data was found    Visit Count: 5   Diagnosis:    Diagnosis ICD-10-CM Associated Order   1. Neck pain  M54.2                Subjective     Daily Subjective   Pt reports she is feeling stiffness in her neck but not as much as last time today. She mentioned it is usually her R shoulder that has been bothering her most. She mentioned she has continued to do her HEP and states she feels little to no change from them.    Social Support/Occupation    Lives in: multiple level home  community based residential facility    Lives with: adult children    Occupation: Retired           Precautions: No data was found  Allergies: Patient has no known allergies.    Objective                       Treatment     Therapeutic Exercises - Justified to address any of the following:  To develop strength, endurance, ROM and/or flexibility.   Subjective intake to determine course of treatment today    UBE 2 mins forwards and 2 mins backwards with 1 pillow behind back, cues in upright posture    Reviewed HEP w/ emphasis in proper form to inhibit discomfort and compensations     C/S smiles U's to L and R, 2 sets of 5 reps, cues in tempo and proper form in supine    Chin tuck with lift in supine, 10 sec holds 5 reps 2 sets and cues in proper form and no C/S extension  compensations    Seated levator scap. each UE 10 sec holds 5 reps    (Extensive time taken to complete the above)    Neuromuscular Re-Education - Justified to address any of the following:   Re-education of movement, balance, coordination, kinesthetic sense, posture and/or proprioception for sitting and/or standing activities.   S/L ER along R UE 3 sec holds 3 sets of 5 reps and cues in proper form and alignment at shoulder, trunk and elbow    Supine PTA manually resisted ER in pain free ranges and cues in breathing to inhibit guarding 2 sets of 5 reps    Manual Therapy - Justified to address any of the following:    Mobilization of joints and soft tissues, manipulation, manual lymphatic drainage, and/or manual traction.    In supine:  STM along bil. C/S paraspinals, suboccipitals, levator scap.    TPR along bil. UT, cues in breathing    Bil. Cervical P/A mobilizations grade I/II     Bil. Lateral cervical mobilizations grade I  PTA Manual C/S gentle distraction    Home Exercises   Access Code: [email or web address]  URL: [email or web address]  Date: 04/15/2023  Prepared by: Seymour Bars    Exercises  - Seated Scapular Retraction  - 1 x daily - 3 sets - 10 reps  - Sidelying Shoulder External Rotation  - 1 x daily - 3 sets - 15 reps  - Supine Deep Neck Flexor Training - Repetitions  - 1 x daily - 10 reps - 10sec hold  - Prone Cervical Retraction  - 1 x daily - 10 reps - 10sec hold  - Prone Scapular Retraction  - 1 x daily - 10 reps - 10sec hold  - Gentle Levator Scapulae Stretch  - 1 x daily - 7 x weekly - 5 reps - 10 hold  - Standing Cervical Rotation AROM with Overpressure  - 1 x daily - 7 x weekly - 2 sets - 5 reps - 5 hold       ---      Flowsheet Row ---   Total Time    Timed Minutes 38 minutes   Total Time 38 minutes          Assessment   Pt noted good tolerance to addition of C/S smiles in supine without experiencing any pain or discomfort along C/S. She noted most fatigue with addition of UBE and  side lying R UE ER and noted some soreness in her R shoulder however, noted no increase in C/S symptoms. She will continue to benefit from skilled PT services to address deficits in C/S ROM and UE strength, to achieve goals listed below.  Plan   F/U on HEP in proper form and stretches.      Goals      Goal 1: Pt will be able to demo HEP with zero to min cuing with good form to allow for independent performance at home and return to PLOF    Access Code: [email or web address]  URL: [email or web address]  Date: 03/16/2023  Prepared by: Tommy Dishion    Exercises  - Seated Scapular Retraction  - 1 x daily - 3 sets - 10 reps  - Sidelying Shoulder External Rotation  - 1 x daily - 3 sets - 15 reps   Sessions: 16      Goal 2: Pt will score >/= 51/100 on FOTO FS Primary measure vs 39/100 @ IE, demonstrating improved functional mobility for return to PLOF     Sessions: 16      Goal 3: Patient will demonstrate 4+/5 MMT of ER to improve dynamic stability to allow patient to place dishes in overhead cabinets without increase in pain     Sessions: 6      Goal 4: Patient will demonstrate 5/5 MMT of flexion to improve dynamic stability to allow patient to lift dishes into shelves without excessive effort       Sessions: [address] [place], LPTA      Parts of this note were generated by the Epic EMR system/Dragon speech recognition and may contain inherent errors or omissions not intended by the user. Grammatical errors, random word insertions, deletions, pronoun errors and incomplete sentences are occasional consequences of this technology due to software limitations. Not all errors are caught or corrected.  If there are questions or concerns about  the content of this note or information contained within the body of this dictation they should be addressed directly with the author for clarification.

## 2023-05-01 ENCOUNTER — Inpatient Hospital Stay: Payer: Medicare Other | Admitting: Rehabilitative and Restorative Service Providers"

## 2023-05-04 ENCOUNTER — Inpatient Hospital Stay: Payer: Medicare Other

## 2023-05-05 ENCOUNTER — Inpatient Hospital Stay: Payer: Medicare Other | Attending: Family | Admitting: Rehabilitative and Restorative Service Providers"

## 2023-05-05 DIAGNOSIS — M542 Cervicalgia: Secondary | ICD-10-CM | POA: Insufficient documentation

## 2023-05-05 NOTE — PT/OT Therapy Note (Signed)
Name: Michele [name] Age: 82 y.o.   Date of Service: 05/05/2023  Referring Physician: Eliezer Lofts, NP   Date of Injury: No data found 01/13/2023  PT Date Care Plan Established/Reviewed:03/16/2023  PT Date Treatment Started:03/16/2023  OT Date Care Plan Established/Reviewed: No data found  OT Date Treatment Started: No data found    (Historic) Date of Injury:No data was found  (Historic) Date Care Plan Established/Reviewed No data was found  No data was found  (Historic) Date Treatment Started No data was found No data was found    End of Certification Date: 06/13/2023  Sessions in Plan of Care: 16  Surgery Date: No data was found  MD Follow-up: No data was found  Medbridge Code: No data was found    Visit Count: 6   Diagnosis:    Diagnosis ICD-10-CM Associated Order   1. Neck pain  M54.2                Subjective     Daily Subjective   States the neck hasn't been too bad. States her neck and shoulder are a little bit better but still bothers her. States the right is worse than the left.     Social Support/Occupation    Lives in: multiple level home  community based residential facility    Lives with: adult children    Occupation: Retired           Precautions: No data was found  Allergies: Patient has no known allergies.    Objective                       Treatment     Therapeutic Exercises - Justified to address any of the following:  To develop strength, endurance, ROM and/or flexibility.   S/L ER along R UE 3 sets of 8 repetitions with 1 lb weight and cues in proper form and alignment at shoulder, trunk and elbow    Reviewed current HEP with patient and discussed appropriate performance and progression of program.       Neuromuscular Re-Education - Justified to address any of the following:   Re-education of movement, balance, coordination, kinesthetic sense, posture and/or proprioception for sitting and/or standing activities.   Supine shoulder horizontal abd using Red Theraband 3 sets of 6 repetitions  with cues  to dec shoulder hike    Shoulder w's with Red Theraband  with cues to keep elbows close to body 3 sets of 8 repetitions     Chin tucks 10 repetitions for 10 seconds per repetition with cues to avoid C/S nodding    Scap retraction 10 repetitions for 10 seconds per repetition.  With cues to dec shoulder hike     Manual Therapy - Justified to address any of the following:    Mobilization of joints and soft tissues, manipulation, manual lymphatic drainage, and/or manual traction.    In supine:  STM along bil. C/S paraspinals, suboccipitals, levator scap.    TPR along bil. UT, cues in breathing    Bil. Cervical P/A mobilizations grade 3     Bil. Lateral cervical mobilizations grade 3     Manual C/S gentle distraction    A-P GHJ mob grade 2-3 on right    Home Exercises   Access Code: [email or web address]  URL: [email or web address]  Date: 04/15/2023  Prepared by: Seymour Bars    Exercises  - Seated Scapular Retraction  - 1 x daily - 3 sets - 10 reps  -  Sidelying Shoulder External Rotation  - 1 x daily - 3 sets - 15 reps  - Supine Deep Neck Flexor Training - Repetitions  - 1 x daily - 10 reps - 10sec hold  - Prone Cervical Retraction  - 1 x daily - 10 reps - 10sec hold  - Prone Scapular Retraction  - 1 x daily - 10 reps - 10sec hold  - Gentle Levator Scapulae Stretch  - 1 x daily - 7 x weekly - 5 reps - 10 hold  - Standing Cervical Rotation AROM with Overpressure  - 1 x daily - 7 x weekly - 2 sets - 5 reps - 5 hold       ---      Flowsheet Row ---   Total Time    Timed Minutes 39 minutes   Total Time 39 minutes            Assessment   Patient tol session well with improved stability in neck and shoulder. Patient educated on form during scap retraction to dec shoulder hike. Patient to be on trial HEP with plans for [place] in 4 weeks.   Plan   Trial HEP. [place] in 4 weeks       Goals      Goal 1: Pt will be able to demo HEP with zero to min cuing with good form to allow for independent performance at home and return to  PLOF    Access Code: [email or web address]  URL: [email or web address]  Date: 03/16/2023  Prepared by: Tommy Dishion    Exercises  - Seated Scapular Retraction  - 1 x daily - 3 sets - 10 reps  - Sidelying Shoulder External Rotation  - 1 x daily - 3 sets - 15 reps   Sessions: 16      Goal 2: Pt will score >/= 51/100 on FOTO FS Primary measure vs 39/100 @ IE, demonstrating improved functional mobility for return to PLOF     Sessions: 16      Goal 3: Patient will demonstrate 4+/5 MMT of ER to improve dynamic stability to allow patient to place dishes in overhead cabinets without increase in pain     Sessions: 6      Goal 4: Patient will demonstrate 5/5 MMT of flexion to improve dynamic stability to allow patient to lift dishes into shelves without excessive effort       Sessions: 16                                  Reginal Lutes, DPT      Parts of this note were generated by the Epic EMR system/Dragon speech recognition and may contain inherent errors or omissions not intended by the user. Grammatical errors, random word insertions, deletions, pronoun errors and incomplete sentences are occasional consequences of this technology due to software limitations. Not all errors are caught or corrected.  If there are questions or concerns about the content of this note or information contained within the body of this dictation they should be addressed directly with the author for clarification.

## 2023-05-06 ENCOUNTER — Inpatient Hospital Stay: Payer: Medicare Other | Admitting: Rehabilitative and Restorative Service Providers"

## 2023-05-06 ENCOUNTER — Inpatient Hospital Stay: Payer: Medicare Other

## 2023-05-12 ENCOUNTER — Ambulatory Visit (INDEPENDENT_AMBULATORY_CARE_PROVIDER_SITE_OTHER): Payer: Medicare Other | Admitting: Family

## 2023-05-12 ENCOUNTER — Encounter (INDEPENDENT_AMBULATORY_CARE_PROVIDER_SITE_OTHER): Payer: Self-pay | Admitting: Family

## 2023-05-12 VITALS — BP 106/60 | HR 75 | Temp 98.1°F | Ht <= 58 in | Wt 111.0 lb

## 2023-05-12 DIAGNOSIS — G8929 Other chronic pain: Secondary | ICD-10-CM

## 2023-05-12 DIAGNOSIS — M5440 Lumbago with sciatica, unspecified side: Secondary | ICD-10-CM

## 2023-05-12 DIAGNOSIS — E7800 Pure hypercholesterolemia, unspecified: Secondary | ICD-10-CM

## 2023-05-12 DIAGNOSIS — M1712 Unilateral primary osteoarthritis, left knee: Secondary | ICD-10-CM

## 2023-05-12 NOTE — Progress Notes (Signed)
Have you seen any specialists/other providers since your last visit with [place]?    Yes- ENT    Arm preference verified?   Yes, no preference    Health Maintenance Due   Topic Date Due    Advance Directive on File  Never done    Spirometry Annual  Never done

## 2023-05-12 NOTE — Progress Notes (Signed)
 Subjective:      Patient ID: Michele [name] is a 82 y.o. female     Chief Complaint   Patient presents with    Hyperlipidemia     Pt is not fasting    Referral To Another Service     Updated PT referral for neck/back pain        Hyperlipidemia  The patient

## 2023-05-13 ENCOUNTER — Other Ambulatory Visit: Payer: Medicare Other

## 2023-05-14 LAB — COMPREHENSIVE METABOLIC PANEL
ALT: 12 [IU]/L (ref 0–32)
AST (SGOT): 23 [IU]/L (ref 0–40)
Albumin: 4.5 g/dL (ref 3.7–4.7)
Alkaline Phosphatase: 77 [IU]/L (ref 44–121)
BUN / Creatinine Ratio: 25 (ref 12–28)
BUN: 14 mg/dL (ref 8–27)
Bilirubin, Total: 0.6 mg/dL (ref 0.0–1.2)
CO2: 27 mmol/L (ref 20–29)
Calcium: 9.4 mg/dL (ref 8.7–10.3)
Chloride: 104 mmol/L (ref 96–106)
Creatinine: 0.56 mg/dL — ABNORMAL LOW (ref 0.57–1.00)
Globulin, Total: 2.2 g/dL (ref 1.5–4.5)
Glucose: 102 mg/dL — ABNORMAL HIGH (ref 70–99)
Potassium: 4.9 mmol/L (ref 3.5–5.2)
Protein, Total: 6.7 g/dL (ref 6.0–8.5)
Sodium: 144 mmol/L (ref 134–144)
eGFR: 90 mL/min/{1.73_m2} (ref >59)

## 2023-05-14 LAB — CBC AND DIFFERENTIAL
Baso(Absolute): 0 10*3/uL (ref 0.0–0.2)
Basophils Automated: 1 %
Eosinophils Absolute: 0.1 10*3/uL (ref 0.0–0.4)
Eosinophils Automated: 1 %
Hematocrit: 41.8 % (ref 34.0–46.6)
Hemoglobin: 13.1 g/dL (ref 11.1–15.9)
Immature Granulocytes Absolute: 0 10*3/uL (ref 0.0–0.1)
Immature Granulocytes: 0 %
Lymphocytes Absolute: 1.5 10*3/uL (ref 0.7–3.1)
Lymphocytes Automated: 38 %
MCH: 30 pg (ref 26.6–33.0)
MCHC: 31.3 g/dL — ABNORMAL LOW (ref 31.5–35.7)
MCV: 96 fL (ref 79–97)
Monocytes Absolute: 0.4 10*3/uL (ref 0.1–0.9)
Monocytes: 9 %
Neutrophils Absolute Count: 2 10*3/uL (ref 1.4–7.0)
Neutrophils: 51 %
Platelets: 145 10*3/uL — ABNORMAL LOW (ref 150–450)
RBC: 4.37 x10E6/uL (ref 3.77–5.28)
RDW: 13 % (ref 11.7–15.4)
WBC: 3.9 10*3/uL (ref 3.4–10.8)

## 2023-05-14 LAB — LIPID PANEL
Cholesterol / HDL Ratio: 2.3 {ratio} (ref 0.0–4.4)
Cholesterol: 164 mg/dL (ref 100–199)
HDL: 71 mg/dL (ref >39)
LDL Chol Calculated (NIH): 82 mg/dL (ref 0–99)
Triglycerides: 56 mg/dL (ref 0–149)
VLDL Calculated: 11 mg/dL (ref 5–40)

## 2023-05-20 ENCOUNTER — Inpatient Hospital Stay: Payer: Medicare Other | Attending: Family | Admitting: Rehabilitative and Restorative Service Providers"

## 2023-05-20 ENCOUNTER — Encounter: Payer: Self-pay | Admitting: Rehabilitative and Restorative Service Providers"

## 2023-05-20 VITALS — BP 117/65 | HR 77

## 2023-05-20 DIAGNOSIS — M5451 Vertebrogenic low back pain: Secondary | ICD-10-CM | POA: Insufficient documentation

## 2023-05-20 DIAGNOSIS — M5440 Lumbago with sciatica, unspecified side: Secondary | ICD-10-CM | POA: Insufficient documentation

## 2023-05-20 DIAGNOSIS — G8929 Other chronic pain: Secondary | ICD-10-CM | POA: Insufficient documentation

## 2023-05-20 NOTE — Progress Notes (Signed)
 Name:Rylann Rouser Age: 82 y.o.   Date of Service: 05/20/2023  Referring Physician: Eliezer Lofts, NP   Date of Injury: No data found 05/12/2023  PT Date Care Plan Established/Reviewed:05/20/2023  PT Date Treatment Started:05/20/2023  OT Date Care Plan

## 2023-06-16 ENCOUNTER — Inpatient Hospital Stay: Payer: Medicare Other | Attending: Family | Admitting: Rehabilitative and Restorative Service Providers"

## 2023-06-16 DIAGNOSIS — M5451 Vertebrogenic low back pain: Secondary | ICD-10-CM | POA: Insufficient documentation

## 2023-06-16 NOTE — PT/OT Therapy Note (Signed)
Name: Michele [name] Age: 82 y.o.   Date of Service: 06/16/2023  Referring Physician: Eliezer Lofts, NP   Date of Injury: No data found 05/12/2023  PT Date Care Plan Established/Reviewed:05/20/2023  PT Date Treatment Started:05/20/2023  OT Date Care Plan Established/Reviewed: No data found  OT Date Treatment Started: No data found    (Historic) Date of Injury:No data was found  (Historic) Date Care Plan Established/Reviewed No data was found  No data was found  (Historic) Date Treatment Started No data was found No data was found    End of Certification Date: 08/17/2023  Sessions in Plan of Care: 16  Surgery Date: No data was found  MD Follow-up: No data was found  Medbridge Code: No data was found    Visit Count: 2   Diagnosis:    Diagnosis ICD-10-CM Associated Order   1. 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 States she has been doing the exercises. When she does them it helps some. States she has a burning feeling in the front and the back of the leg. States the back doesn't hurt much, but the knee is really bad. The whole leg hurts.     Social Support/Occupation    Lives in: multiple level home  community based residential facility    Lives with: adult children    Occupation: Retired           Precautions: No data was found  Allergies: Patient has no known allergies.    Objective   Non painful to lumbar paraspinals aand lateral hip musculature                       Treatment     Therapeutic Exercises - Justified to address any of the following:  To develop strength, endurance, ROM and/or flexibility.   -Time spent for subjective intake, objective measures, and review of HEP    Recumbent bike 3 minutes (subjective intake taken)    DL bridge holds 5 reps for 10 seconds with cues for hip extension    SLR 0# 2 sets of 15 reps with cues for slow tempo    DL heel raises 20 reps with cues for slow tempo    Neuromuscular  Re-Education - Justified to address any of the following:   Re-education of movement, balance, coordination, kinesthetic sense, posture and/or proprioception for sitting and/or standing activities.   Manually resisted clamshell ER with cues for slow tempo 10 reps to improve hip proprioception    Manually resisted IR with knees flexed with cues for slow tempo 10 reps to improve hip proprioception     Manually resisted sidelying hip abduction with cues for slow tempo 10 reps to improve hip proprioception    Manually resisted prone hip ER 10 reps to improve hip proprioception    Supine 90/90 table top holds with cues for abdominal bracing 5 reps for 10 seconds to improve lumbar prooprioception    Manual Therapy - Justified to address any of the following:    Mobilization of joints and soft tissues, manipulation, manual lymphatic drainage, and/or manual traction.    STM throughout vastus lateralis, vastus medius, rectus femoris, and surrounding myofascia    (Assessment of soft tissues of lumbar paraspinals/lateral hip musculature)    Therapeutic Activity - Justified  to address the following:  Dynamic activities to improve functional performance.  Lateral band walks with gray mini band around knees 5 laps length of table to improve walking performance    TRX squats with cues for appropriate depth 15 reps to improve squatting performance    TRX rows 15 reps with cues for setup to improve functional pulling performance       ---      Flowsheet Row ---   Total Time    Timed Minutes 36 minutes   Total Time 36 minutes          Assessment   Pt presents to physical therapy with continued Lt leg and knee pain. Pt reported slight decrease in pain following today's session. Pt had pain with TRX squats, however improved with cues for less depth. Pt was able to tolerate all other interventions with no reports of increase in pain.  Pt can continue to benefit from physical therapy to decrease pain and return to prior level of  function.    Plan   Assess response to HEP  Gentle exercise  Soft tissue mobilization as tolerated      Goals      Goal 1: Pt will be able to demo HEP with zero to min cuing with good form to allow for independent performance at home and return to PLOF    Access Code: [email or web address]  URL: [email or web address]  Date: 05/20/2023  Prepared by: Sha Amer    Exercises  - Bridge  - 1 x daily - 5 reps - 10sec hold  - Sidelying Hip Abduction  - 1 x daily - 3 sets - 15 reps  - Supine 90/90 Abdominal Bracing  - 1 x daily - 5 reps - 10sec hold   Sessions: 16      Goal 2: Pt will score >/= 49/100 on FOTO FS Primary measure vs 32/100 @ IE, demonstrating improved functional mobility for return to PLOF     Sessions: 16      Goal 3: STG: Patient will demonstrate 5/5 MMT hip flexion strength to facilitate ambulation for greater than 40 minutes to go grocery shopping and perform community ambulation without excessive effort       Sessions: 6      Goal 4: Patient will demonstrate 4+/5 MMT hip abduction strength to facilitate normalized gait pattern without hip drop or knee valgus for pain free ambulation for 1 hours to go grocery shopping and perform community ambulation without pain       Sessions: 16                                  Michele [name], DPT      Parts of this note were generated by the Epic EMR system/Dragon speech recognition and may contain inherent errors or omissions not intended by the user. Grammatical errors, random word insertions, deletions, pronoun errors and incomplete sentences are occasional consequences of this technology due to software limitations. Not all errors are caught or corrected.  If there are questions or concerns about the content of this note or information contained within the body of this dictation they should be addressed directly with the author for clarification.

## 2023-06-23 ENCOUNTER — Inpatient Hospital Stay: Payer: Medicare Other

## 2023-06-24 ENCOUNTER — Inpatient Hospital Stay: Payer: Medicare Other | Attending: Family

## 2023-06-24 DIAGNOSIS — M5451 Vertebrogenic low back pain: Secondary | ICD-10-CM | POA: Insufficient documentation

## 2023-06-24 NOTE — PT/OT Therapy Note (Signed)
 Name: Michele [name] Age: 82 y.o.   Date of Service: 06/24/2023  Referring Physician: Eliezer Lofts, NP   Date of Injury: No data found 05/12/2023  PT Date Care Plan Established/Reviewed:05/20/2023  PT Date Treatment Started:05/20/2023  OT Date Care Plan

## 2023-06-26 ENCOUNTER — Telehealth (INDEPENDENT_AMBULATORY_CARE_PROVIDER_SITE_OTHER): Payer: Self-pay | Admitting: Family

## 2023-06-26 ENCOUNTER — Inpatient Hospital Stay: Payer: Medicare Other | Attending: Family | Admitting: Rehabilitative and Restorative Service Providers"

## 2023-06-26 ENCOUNTER — Other Ambulatory Visit (INDEPENDENT_AMBULATORY_CARE_PROVIDER_SITE_OTHER): Payer: Self-pay | Admitting: Family

## 2023-06-26 DIAGNOSIS — M542 Cervicalgia: Secondary | ICD-10-CM

## 2023-06-26 DIAGNOSIS — M5451 Vertebrogenic low back pain: Secondary | ICD-10-CM | POA: Insufficient documentation

## 2023-06-26 NOTE — Telephone Encounter (Signed)
 PT [facility] office called to request PT referral, as previously requested by pt. Informed office that request has been routed to care team and pt will be notified when there is an update.

## 2023-06-26 NOTE — Telephone Encounter (Signed)
 LVM informing patient the requested referral has been placed by PCP

## 2023-06-26 NOTE — Progress Notes (Signed)
 Name: Senita Corredor Age: 82 y.o.   Date of Service: 06/26/2023  Referring Physician: Eliezer Lofts, NP   Date of Injury: No data found 05/12/2023  PT Date Care Plan Established/Reviewed:05/20/2023  PT Date Treatment Started:05/20/2023  OT Date Care Plan

## 2023-06-26 NOTE — Progress Notes (Signed)
 Requesting PT referral for neck

## 2023-06-26 NOTE — Telephone Encounter (Signed)
 Patient came into office to request PT referral for right shoulder pain.    Please provide patient with referral or notify if appt is required. Pt requesting update via phone call as soon as possible.    [phone]

## 2023-06-26 NOTE — PT/OT Therapy Note (Deleted)
 Name: Michele [name] Age: 82 y.o.   Date of Service: 06/26/2023  Referring Physician: Eliezer Lofts, NP   Date of Injury: No data found 05/12/2023  PT Date Care Plan Established/Reviewed:05/20/2023  PT Date Treatment Started:05/20/2023  OT Date Care Plan

## 2023-07-16 ENCOUNTER — Ambulatory Visit (INDEPENDENT_AMBULATORY_CARE_PROVIDER_SITE_OTHER): Payer: Medicare Other | Admitting: Family

## 2024-01-06 ENCOUNTER — Encounter (INDEPENDENT_AMBULATORY_CARE_PROVIDER_SITE_OTHER): Payer: Self-pay | Admitting: Family Medicine

## 2024-01-06 ENCOUNTER — Ambulatory Visit (INDEPENDENT_AMBULATORY_CARE_PROVIDER_SITE_OTHER): Admitting: Family Medicine

## 2024-01-06 VITALS — BP 135/72 | HR 86 | Temp 98.2°F | Ht <= 58 in | Wt 112.0 lb

## 2024-01-06 DIAGNOSIS — M4724 Other spondylosis with radiculopathy, thoracic region: Secondary | ICD-10-CM

## 2024-01-06 DIAGNOSIS — E785 Hyperlipidemia, unspecified: Secondary | ICD-10-CM

## 2024-01-06 DIAGNOSIS — M48061 Spinal stenosis, lumbar region without neurogenic claudication: Secondary | ICD-10-CM

## 2024-01-06 DIAGNOSIS — Z0000 Encounter for general adult medical examination without abnormal findings: Secondary | ICD-10-CM

## 2024-01-06 MED ORDER — GABAPENTIN 100 MG PO CAPS: 100.0000 mg | ORAL_CAPSULE | Freq: Every evening | ORAL | 1 refills | Status: DC

## 2024-01-06 MED ORDER — ATORVASTATIN CALCIUM 10 MG PO TABS
10.0000 mg | ORAL_TABLET | Freq: Every day | ORAL | 2 refills | Status: AC
Start: 2024-01-06 — End: ?

## 2024-01-06 NOTE — Progress Notes (Signed)
 Have you seen any specialists/other providers since your last visit with us ?    Yes    Health Maintenance Due   Topic Date Due    Advance Directive on File  Never done    Spirometry Annual  Never done    Tetanus Ten-Year  Never done    COVID-19 Vaccine (1 - 2024-25 season) Never done    FALLS RISK ANNUAL  12/09/2023    DEPRESSION SCREENING  12/09/2023    Medicare Annual Wellness Visit  12/09/2023

## 2024-01-06 NOTE — Assessment & Plan Note (Signed)
 Orders:    CBC with Differential (Order); Future    Comprehensive Metabolic Panel; Future    Thyroid  Stimulating Hormone (TSH) with Reflex to Free T4; Future    atorvastatin  (LIPITOR) 10 MG tablet; Take 1 tablet (10 mg) by mouth once daily

## 2024-01-06 NOTE — Assessment & Plan Note (Addendum)
 Orders:    CBC with Differential (Order); Future    Comprehensive Metabolic Panel; Future    Thyroid  Stimulating Hormone (TSH) with Reflex to Free T4; Future    atorvastatin  (LIPITOR) 10 MG tablet; Take 1 tablet (10 mg) by mouth once daily  on statin  Will check labs

## 2024-01-06 NOTE — Progress Notes (Signed)
 [facility]   Medicare Wellness Visit                 Michele [name] is a 83 y.o. female who presents today for the following Medicare Wellness Visit:  []  Initial Preventive Physical Exam (IPPE) - Welcome to Medicare preventive visit (Vision Screening required)   []  Annual Wellness Visit - Initial  [x]  Annual Wellness Visit - Subsequent                                                                                                                                                 Health Risk Assessment   During the past month, how would you rate your general health?:  Good  Which of the following tasks can you do without assistance - drive or take the bus alone; shop for groceries or clothes; prepare your own meals; do your own housework/laundry; handle your own finances/pay bills; eat, bathe or get around your home?: Drive or take the bus alone, Shop for groceries or clothes, Prepare your own meals, Do your own housework/laundry, Handle your own finances/pay bills, Eat, bathe, dress or get around your home  Which of the following problems have you been bothered by in the past month - dizzy when standing up; problems using the phone; feeling tired or fatigued; moderate or severe body pain?: Feeling tired or fatigued, Moderate or severe body pain  Do you exercise for about 20 minutes 3 or more days per week?:Yes  During the past month was someone available to help if you needed and wanted help?  For example, if you felt nervous, lonely, got sick and had to stay in bed, needed someone to talk to, needed help with daily chores or needed help just taking care of yourself.: No  Do you always wear a seat belt?: Yes  Do you have any trouble taking medications the way you have been told to take them?: No  Have you been given any information that can help you with keeping track of your medications?: No  Do you have trouble paying for your medications?: No  Have you been given any information that can help you with  hazards in your house, such as scatter rugs, furniture, etc?: No  Do you feel unsteady when standing or walking?: Yes  Do you worry about falling?: Yes  Have you fallen two or more times in the past year?: No  Did you suffer any injuries from your falls in the past year?: No    Hospitalizations   Hospitalization within past year: [x]  No  []  Yes     Diagnosis:    Screenings         11/26/2021 12/09/2022 01/06/2024   Ambulatory Screenings   Falls Risk: Terrilee more than 2 times in past year N N N    N   Falls Risk:  Suffer any injuries? N N N    N   Depression: PHQ2 Total Score 0  0  0   Depression: PHQ9 Total Score 0 0 0       Data saved with a previous flowsheet row definition    Multiple values from one day are sorted in reverse-chronological order        Substance Use Disorder Screen:  In the past year, how often have you used the following?  1) Alcohol (For men, 5 or more drinks a day. For women, 4 or more drinks a day)  [x]  Never []  Once or Twice []  Monthly []  Weekly []  Daily or Almost Daily  2) Tobacco Products  [x]  Never []  Once or Twice []  Monthly []  Weekly []  Daily or Almost Daily  3) Prescription Drugs for Non-Medical Reasons  [x]  Never []  Once or Twice []  Monthly []  Weekly []  Daily or Almost Daily  4) Illegal Drugs  [x]  Never []  Once or Twice []  Monthly []  Weekly []  Daily or Almost Daily            Functional Ability/Level of Safety   Falls Risk/Home Safety Assessment:  (see HRA and Screenings sections for additional assessment)    Home Safety:   [x]  Low Risk for Falls  []  Skid-resistant rugs/remove throw rugs   []  Grab bars    []  Clear pathways between rooms    []  Proper lighting stairs/ bathrooms/bedrooms    Get Up and Go (optional):    []   <20 secs    []   >20 secs    []   High risk for falls - Home Safety/Falls Risk Precautions reviewed with pt/family    Hearing Assessment:  Concerns for hearing loss: []  Yes  []   No  Hearing aids:   []   Right  []   Left  []   Bilateral   []   None  Whisper Test (optional):  []   Normal  []   Slightly decreased  []   Significantly decreased    Visual Acuity:     If no visual acuity exam above:  [x] Patient Declines  []  Up to date with Optometry/Ophthalmology per patient report.    Exercise:  Frequency:  []   No formal exercise  []   1-2x/wk  []   3-4x/wk  []   >4x/wk  Duration:  []   15-30 mins/day  []   30-45 mins/day  []   45+ mins/day  Intensity:  []   Light  []   Moderate  []   Heavy    Diet:  Diet: - consumes a well balanced diet      Activities of Daily Living     ADL's Independent Minimal  Assistance Moderate  Assistance Total   Assistance   Bathing [x]  []  []  []    Dressing [x]  []  []  []    Mobility   [x]  []  []  []    Transfer [x]  []  []  []    Eating [x]  []  []  []    Toileting [x]  []  []  []      IADL's Independent Minimal  Assistance Moderate  Assistance Total   Assistance   Phone [x]  []  []  []    Housekeeping [x]  []  []  []    Laundry [x]  []  []  []    Transportation [x]  []  []  []    Medications [x]  []  []  []    Finances [x]  []  []  []       ADL assistance:   [x]  No assistance needed    []  Spouse    []  Sibling    []  Son   []  Daughter   []  Children    []   Home Health Aide   []  Other:    Social Activities/Engagement   Frequency of Communication with Friends and Family:  []  Never []  Rarely []  Sometimes [x]  Often[]  Very often    Frequency of Social Gatherings with Friends and Family:   []  Never []  Rarely []  Sometimes [x]  Often[]  Very often    Advanced Care Planning   Discussion of Advance Directives:   []  Advance Directive in chart    []  Advance Directive not in chart - requested to provide   []  No Advance Directive.  Form Provided    []  No Advance Directive.  Pt declines.   []  Not addressed today    []  Other:         Exam   BP 135/72 (BP Site: Left arm, Patient Position: Sitting, Cuff Size: Medium)   Pulse 86   Temp 98.2 F (36.8 C) (Oral)   Ht 1.334 m (4' 4.5)   Wt 50.8 kg (112 lb)   BMI 28.57 kg/m   Physical Exam        Evaluation of Cognitive Function   Mood/affect: [x]  Appropriate  []   Other:   Appearance: [x]   Neatly groomed  [x]  Adequately nourished  []  Other:  Family member/caregiver input: []  Present - no concerns  []   Not present in room  []  Present - concerns:    Cognitive Assessment:  Mini-Cog Result (three word registration- banana, sunrise, chair / clock drawing):   [x]   > 3 points - negative screen for dementia   []  3 recalled words - negative screen for dementia   []  1-2 recalled words and normal clock draw - negative for cognitive impairment   []  1-2 recalled words and abnormal clock draw - positive for cognitive impairment   []  0 recalled words - positive for cognitive impairment         Personalized Prevention Plan   The patient was provided with a personalized prevention plan via   the Patient Instructions tab of this visit                                                                                                                                                 Assessment/Plan     Assessment & Plan  Medicare annual wellness visit, subsequent  Documentation in other note         Hyperlipidemia, unspecified hyperlipidemia type    Orders:    CBC with Differential (Order); Future    Comprehensive Metabolic Panel; Future    Thyroid  Stimulating Hormone (TSH) with Reflex to Free T4; Future    atorvastatin  (LIPITOR) 10 MG tablet; Take 1 tablet (10 mg) by mouth once daily    Spinal stenosis of lumbar region without neurogenic claudication    Orders:    MRI Lumbar Spine WO Contrast; Future    gabapentin (NEURONTIN) 100 MG capsule; Take  1 capsule (100 mg) by mouth once at bedtime    Referral to Pain Clinic (EXTERNAL); Future    Osteoarthritis of spine with radiculopathy, thoracic region    Orders:    MRI thoracic spine without contrast; Future    gabapentin (NEURONTIN) 100 MG capsule; Take 1 capsule (100 mg) by mouth once at bedtime    Referral to Pain Clinic (EXTERNAL); Future          History/Care Team   Patient Care Team:  Celestino Florette POUR, NP as PCP - General (Family Nurse Practitioner)  Hoover Acosta, MD  as Consulting Physician (Cardiology)  Romero Lamar LABOR, MD as Consulting Physician (Gastroenterology)  Alla Gwenyth LABOR, MD as Consulting Physician (Neurology)  Sebastian Toribio BRAVO, MD as Consulting Physician (Orthopaedic Surgery)  Ilona Dallas GRADE, MD as Consulting Physician (Ophthalmology)  Eber Bethann SAUNDERS, MD as Consulting Physician (Neurological Surgery)  Jess Piggs, MD as Consulting Physician (Cardiology)  [x]  Medical, surgical, family history reviewed and updated during this encounter  [x]  Medication list updated and reconciled during this encounter    Additional Documentation

## 2024-01-06 NOTE — Progress Notes (Signed)
 [facility]   OFFICE VISIT                HPI     Chief Complaint   Patient presents with    Medicare Annual Wellness Visit - Subsequent      HPI  83 year old female here for medicare wellness.  Patient lives alone,  she cooks.   She does not clean.  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  The pain radiates into both legs  She has burning in both legs.  Patient denies depression but has sadness to not be able to clean and do the things she wants to do due to back pain.  She states for the past 2 years it is worse.  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   She can walk 30 minutes without significant pain.  She states she is closer with her son who will help her if she needs anything.    ROS   Review of Systems   Constitutional:  Negative for chills and fever.   Respiratory:  Negative for shortness of breath.    Cardiovascular:  Negative for chest pain.   Gastrointestinal:  Negative for abdominal pain, diarrhea, nausea and vomiting.   Genitourinary:  Negative for difficulty urinating.   Musculoskeletal:  Positive for arthralgias and back pain.   Psychiatric/Behavioral:  Negative for agitation and behavioral problems.        Vital Signs   BP 135/72 (BP Site: Left arm, Patient Position: Sitting, Cuff Size: Medium)   Pulse 86   Temp 98.2 F (36.8 C) (Oral)   Ht 1.334 m (4' 4.5)   Wt 50.8 kg (112 lb)   BMI 28.57 kg/m     Physical Exam   Physical Exam  Constitutional:       Appearance: Normal appearance.   HENT:      Mouth/Throat:      Mouth: Mucous membranes are moist.      Pharynx: No posterior oropharyngeal erythema.     Cardiovascular:      Rate and Rhythm: Normal rate and regular rhythm.   Pulmonary:      Effort: Pulmonary effort is normal.      Breath sounds: Normal breath sounds.   Abdominal:      Tenderness: There is no abdominal tenderness.     Musculoskeletal:      Comments: Thoracic and lumbar spine  No paraspinal TTP  Straight leg negative bilaterally      Neurological:      Mental Status: She is alert and oriented to person, place, and time.     Psychiatric:         Mood and Affect: Mood normal.         Behavior: Behavior normal.         Assessment/Plan     Assessment & Plan  Medicare annual wellness visit, subsequent  Patient encouraged on daily walks  Dash diet information given         Hyperlipidemia, unspecified hyperlipidemia type    Orders:    CBC with Differential (Order); Future    Comprehensive Metabolic Panel; Future    Thyroid  Stimulating Hormone (TSH) with Reflex to Free T4; Future    atorvastatin  (LIPITOR) 10 MG tablet; Take 1 tablet (10 mg) by mouth once daily  on statin  Will check labs    Spinal stenosis of lumbar region without neurogenic claudication  Orders:    MRI Lumbar Spine WO Contrast; Future    gabapentin (NEURONTIN) 100 MG capsule; Take 1 capsule (100 mg) by mouth once at bedtime    Referral to Pain Clinic (EXTERNAL); Future  x 10 years  Worsening pain in 2 years  Will get updated MRI and refer to Dr.Hung next door at [facility]  Will start gabapentin. Possible side effects discussed  Patient asked to call if she has any adverse reaction within next 2 weeks    Osteoarthritis of spine with radiculopathy, thoracic region    Orders:    MRI thoracic spine without contrast; Future    gabapentin (NEURONTIN) 100 MG capsule; Take 1 capsule (100 mg) by mouth once at bedtime    Referral to Pain Clinic (EXTERNAL); Future  same as above    F/u 6 months

## 2024-01-06 NOTE — Patient Instructions (Signed)
 MEDICARE WELLNESS PERSONAL PREVENTION PLAN   As part of the Medicare Wellness portion of your visit today, we are providing you with this personalized preventative plan of care. The list below includes many common screening recommendations from the USPSTF ([place]  Preventive Services Task Force) but is not meant to be comprehensive. You may be eligible for other preventative services depending upon your personal risk factors.     Health Maintenance   Topic Date Due    Advance Directive on File  Never done    Spirometry Annual  Never done    Tetanus Ten-Year  Never done    COVID-19 Vaccine (1 - 2024-25 season) Never done    Influenza Vaccine  02/05/2024    FALLS RISK ANNUAL  01/05/2025    DEPRESSION SCREENING  01/05/2025    Medicare Annual Wellness Visit  01/05/2025    DXA Scan  Completed    Pneumonia Vaccine Age 27 Years and Older  Completed    Shingrix Vaccine 50+  Addressed     Health Maintenance Topics with due status: Overdue       Topic Date Due    Advance Directive on File Never done    Spirometry Annual Never done    Tetanus Ten-Year Never done    COVID-19 Vaccine Never done      Immunization History   Administered Date(s) Administered    Pneumococcal conjugate (PREVNAR 20) 20-valent, preservative free 01/13/2023    Pneumococcal conjugate (PREVNAR) 13-valent 11/26/2015        Your major risk factors:   Recommendations for improvement:      Colorectal Cancer Screening - All adults 45-75 yrs should undergo periodic colorectal cancer screening. The decision to screen for colorectal cancer in adults aged 64 to 35 years should be an individual one, taking into account your overall health and prior screening history.   Breast Cancer Screening - Women aged 40-69yrs should have mammograms every other year (please note that this recommendation may not be appropriate for every woman - your physician can answer specific questions you may have). The USPSTF concludes that the current evidence is insufficient to  assess the balance of benefits and harms of screening mammography in women aged 28 years or older.  These recommendations do not apply to persons who have a genetic marker or syndrome associated with a high risk of breast cancer (eg, BRCA1 or BRCA2 genetic variation), a history of high-dose radiation therapy to the chest at a young age, or previous breast cancer or a high-risk breast lesion on previous biopsies.   Cervical Cancer Screening - Women over 64 do not require pap smears as long as prior screening has been normal and are not otherwise at high risk for cervical cancer. The USPSTF recommends screening for cervical cancer every 3 years with cervical cytology alone in women aged 21 to 49 years. For women aged 90 to 79 years, the USPSTF recommends screening every 3 years with cervical cytology alone, every 5 years with high-risk human papillomavirus (hrHPV) testing alone, or every 5 years with hrHPV testing in combination with cytology (cotesting).   Osteoporosis Screening -  The USPSTF recommends screening for osteoporosis with bone measurement testing to prevent osteoporotic fractures in women 65 years and older.  For postmenopausal women younger than 65 years who are at increased risk of osteoporosis, as determined by a formal clinical risk assessment tool, the USPSTF recommends screening for osteoporosis with bone measurement testing to prevent osteoporotic fractures.   Hepatitis C Screening -  Recommend screening for hepatitis C virus (HCV) infection in all adults aged 23 to 42 years.  Lung cancer Screening - Recommend annual screening for lung cancer with low-dose computed tomography (LDCT) in adults ages 5 to 18 years who have a 20 pack-year smoking history and currently smoke or have quit within the past 15 years.  Recommended Vaccinations from the CDC (U.S.  Centers for Disease Control and Prevention)   Influenza one dose annually   COVID vaccine - stay current with the most up-to-date COVID  update/booster  Tetanus/diphtheria (Tdap) one booster every 10 years   Zoster/Shingles - (Shingrix) two doses after age 32 (second dose given 2-6 months after first dose)  Pneumococcal 20-valent or 21-valent conjugate vaccine - one dose for adults aged >=65 years with no history of prior pneumococcal vaccination.  Pneumococcal 20-valent or 21-valent conjugate vaccine -  one dose for adults aged >=65 years with PPSV23 only or PCV13 only given more than 1 yr ago.  Pneumococcal 20-valent or 21-valent conjugate vaccine - one dose for adults aged >=65 years who have completed PCV-13 and PPSV23 after 84yrs old and it has been greater than 82yrs (shared clinical decision-making is recommended regarding administration of this vaccine).   RSV (Respiratory Syncytial Virus) vaccine for everyone ages 10 and older  RSV (Respiratory Syncytial Virus)  vaccine ages 49-74 who are at increased risk of severe RSV disease (for example, chronic heart/lung/liver/kidney disease, immunocompromised)    PERSONAL PREVENTION PLAN   Your Personal Prevention Plan is based on your overall health and your responses to the health questionnaire you completed. The following information is for you to review in addition to the recommendations, referrals, and tests we have discussed at your visit.     Physical Activity:   Physical activity can help you maintain a healthy weight, prevent or control illness, reduce stress, and sleep better. It can also help you improve your balance to avoid falls. Try to build up to and maintain a total of 30 minutes of activity each day. If you are able, try walking, doing yard or housework, and taking the stairs more often. You can also strengthen your muscles with exercises done while sitting or lying down. Web resource - https://www.carpenter-henry.info/  Emotional Health:   Feeling "down in the dumps" or anxious every now and then is a natural part of life. If this feeling lasts for a few weeks or  more, talk with me as soon as possible. It could be a sign of a problem that needs treatment. There are many types of treatment available. Web resource -  RXPreview.de  Falls:   You can reduce your risk of falling by making changes in your home. Remove items that may cause tripping, improve lighting, and consider installing grab bars.   Talk with me if you have problems with balance and walking. To prevent falls, you may need your vision, hearing, or blood pressure checked. Exercises to improve your strength and balance, or using a cane or walker, may help. Review your medicines with me at every visit, because some can affect balance. Please be sure to let me know if you fall or are fearful you may fall. Web resource - BounceThru.fi  Urinary Leakage:   Urine leakage is common, but it is not a normal part of aging. Talk with me about any urine leakage so that the cause can be found and treated. Treatment can include bladder training, exercises, medicine or surgery.   Pain:   We all have aches  and pains at times, but chronic pain can change how you feel and live every day. Please talk with me about any symptoms of chronic pain so that we can determine how best to treat.   Sleep:   Getting a good night's sleep is vital to your health and well-being and can help prevent or manage health problems. Often, sleep can be improved by changing behaviors, including when you go to bed and what you do before bed. Sleep apnea can cause problems such as struggling to stay awake during the day. Please let me know if you would like to learn more about improving your sleep and/or think you may have sleep apnea. Web resource -  ThousandQuestions.com.cy  Seat Belt:   Please remember to wear a seat belt when driving or riding in a vehicle. It is one of the most important things you can do to stay safe in a car.   Nutrition:   Remember to eat  plenty of fruits, vegetables, whole grains, and dairy. Drink at least 64 ounces (8 full glasses) of water a day, unless you have been advised to limit fluids.  Web resource- PickSeat.dk  Alcohol:   Alcohol can have a greater effect on older people, who may feel its effects at a lower amount. Older people should limit alcoholic drinks (no more than one a day for women and no more than two a day for men). Please let me know if alcohol use becomes a problem.  Web resource - AgingMortgage.ca  Tobacco:   Not smoking or using other forms of tobacco is one of the most important things you can do for your health. Here is some more information about the importance of quitting smoking and how to quit smoking - Mudlogger - BroadJournal.com.pt  Advance Directives:   There may come a time when medical decisions need to be made on your behalf. Please talk with your family, and with me, about your wishes. It is important to provide information about your decisions, and any formal advance directives, for your medical record. Here is additional information on advanced directives - Web resource - MediaExhibitions.no  Additional Support:   Sometimes it can be challenging to manage all aspects of daily life. Finding the right support can help you maintain or improve your health and independence. Please let me know if you would like to talk further about finding resources to assist you.

## 2024-01-07 LAB — COMPREHENSIVE METABOLIC PANEL
ALT: 10 IU/L (ref 0–32)
AST (SGOT): 24 IU/L (ref 0–40)
Albumin: 4.5 g/dL (ref 3.7–4.7)
Alkaline Phosphatase: 84 IU/L (ref 44–121)
BUN / Creatinine Ratio: 41 — ABNORMAL HIGH (ref 12–28)
BUN: 20 mg/dL (ref 8–27)
Bilirubin, Total: 0.5 mg/dL (ref 0.0–1.2)
CO2: 21 mmol/L (ref 20–29)
Calcium: 9.2 mg/dL (ref 8.7–10.3)
Chloride: 105 mmol/L (ref 96–106)
Creatinine: 0.49 mg/dL — ABNORMAL LOW (ref 0.57–1.00)
Globulin, Total: 2.4 g/dL (ref 1.5–4.5)
Glucose: 99 mg/dL (ref 70–99)
Potassium: 4.9 mmol/L (ref 3.5–5.2)
Protein, Total: 6.9 g/dL (ref 6.0–8.5)
Sodium: 144 mmol/L (ref 134–144)
eGFR: 93 mL/min/1.73 (ref >59)

## 2024-01-07 LAB — CBC AND DIFFERENTIAL
Baso(Absolute): 0 x10E3/uL (ref 0.0–0.2)
Basophils Automated: 1 %
Eosinophils Absolute: 0.1 x10E3/uL (ref 0.0–0.4)
Eosinophils Automated: 2 %
Hematocrit: 42.4 % (ref 34.0–46.6)
Hemoglobin: 13 g/dL (ref 11.1–15.9)
Immature Granulocytes Absolute: 0 x10E3/uL (ref 0.0–0.1)
Immature Granulocytes: 0 %
Lymphocytes Absolute: 1.1 x10E3/uL (ref 0.7–3.1)
Lymphocytes Automated: 27 %
MCH: 29.7 pg (ref 26.6–33.0)
MCHC: 30.7 g/dL — ABNORMAL LOW (ref 31.5–35.7)
MCV: 97 fL (ref 79–97)
Monocytes Absolute: 0.3 x10E3/uL (ref 0.1–0.9)
Monocytes: 8 %
Neutrophils Absolute Count: 2.6 x10E3/uL (ref 1.4–7.0)
Neutrophils: 62 %
Platelets: 141 x10E3/uL — ABNORMAL LOW (ref 150–450)
RBC: 4.38 x10E6/uL (ref 3.77–5.28)
RDW: 13.4 % (ref 11.7–15.4)
WBC: 4.2 x10E3/uL (ref 3.4–10.8)

## 2024-01-07 LAB — REFLEX - T4,FREE (DIRECT): T4, Free: 1.06 ng/dL (ref 0.82–1.77)

## 2024-01-07 LAB — THYROID STIMULATING HORMONE (TSH) WITH REFLEX TO FREE T4: TSH: 5.06 u[IU]/mL — ABNORMAL HIGH (ref 0.450–4.500)

## 2024-01-11 ENCOUNTER — Ambulatory Visit (INDEPENDENT_AMBULATORY_CARE_PROVIDER_SITE_OTHER): Payer: Self-pay | Admitting: Family Medicine

## 2024-01-11 DIAGNOSIS — R7989 Other specified abnormal findings of blood chemistry: Secondary | ICD-10-CM

## 2024-01-22 ENCOUNTER — Encounter (INDEPENDENT_AMBULATORY_CARE_PROVIDER_SITE_OTHER): Payer: Self-pay

## 2024-01-25 ENCOUNTER — Encounter (INDEPENDENT_AMBULATORY_CARE_PROVIDER_SITE_OTHER): Payer: Self-pay | Admitting: Family Medicine

## 2024-01-25 DIAGNOSIS — R7989 Other specified abnormal findings of blood chemistry: Secondary | ICD-10-CM

## 2024-01-26 ENCOUNTER — Telehealth (INDEPENDENT_AMBULATORY_CARE_PROVIDER_SITE_OTHER): Payer: Self-pay | Admitting: Family

## 2024-01-26 NOTE — Telephone Encounter (Signed)
 [facility] Authorization Department contacted office to inform provider that the following imaging orders have been denied by insurance:  MRI thoracic spine without contrast (Order [id number])   MRI Lumbar Spine WO Contrast (Order [id number])     Pt was scheduled for tomorrow, but caller will reach out and r/s patient.    Pt was required to complete 6 weeks of conservative treatment, within 3 months of PT.    Requesting a peer-to-peer:  Case no. [id number]  Phone #: [phone]

## 2024-01-27 ENCOUNTER — Encounter (INDEPENDENT_AMBULATORY_CARE_PROVIDER_SITE_OTHER): Payer: Self-pay

## 2024-01-28 ENCOUNTER — Ambulatory Visit

## 2024-02-01 ENCOUNTER — Encounter (INDEPENDENT_AMBULATORY_CARE_PROVIDER_SITE_OTHER): Payer: Self-pay | Admitting: Family

## 2024-02-01 ENCOUNTER — Ambulatory Visit (INDEPENDENT_AMBULATORY_CARE_PROVIDER_SITE_OTHER): Admitting: Family

## 2024-02-01 VITALS — BP 102/56 | HR 65 | Temp 97.8°F | Ht <= 58 in | Wt 110.0 lb

## 2024-02-01 DIAGNOSIS — D693 Immune thrombocytopenic purpura: Secondary | ICD-10-CM

## 2024-02-01 DIAGNOSIS — G959 Disease of spinal cord, unspecified: Secondary | ICD-10-CM

## 2024-02-01 DIAGNOSIS — J449 Chronic obstructive pulmonary disease, unspecified: Secondary | ICD-10-CM

## 2024-02-01 DIAGNOSIS — R7989 Other specified abnormal findings of blood chemistry: Secondary | ICD-10-CM

## 2024-02-01 NOTE — Progress Notes (Signed)
 Have you seen any specialists/other providers since your last visit with us ?    No    Arm preference verified?   Yes, RIGHT ARM    Health Maintenance Due   Topic Date Due    Advance Directive on File  Never done    Spirometry Annual  Never done    Tetanus Ten-Year  Never done    COVID-19 Vaccine (1 - 2024-25 season) Never done

## 2024-02-01 NOTE — Progress Notes (Signed)
 [facility]               Subjective     Chief Complaint   Patient presents with    Thyroid  Problem     4 week f/u     Wt Readings from Last 3 Encounters:   02/01/24 49.9 kg (110 lb)   01/06/24 50.8 kg (112 lb)   05/12/23 50.3 kg (111 lb)       History of Present Illness  Michele [name] is an 83 year old female who presents for follow-up on thyroid  hormone levels.    She feels tired and describes a sensation of 'windy in my head' that starts from her chest and goes up to her head, without associated pain. No heart palpitations or chest pain.    She currently smokes four to five cigarettes a day, sometimes less. No shortness of breath and she does not use inhalers.    She experiences a tremor and reports numbness and pain in her shoulder, arms, and legs, with burning and tingling sensations. She has not yet completed the MRI that was previously ordered.    Her appetite is good, and she feels hungry and eats frequently, especially when at home and not working. No recent falls and she is able to walk for exercise.  Problem   Cervical Myelopathy (Cms/Hcc)    No change. MRI     Copd (Chronic Obstructive Pulmonary Disease) (Cms/Hcc)    Stable. Was On symbicort  daily and albuterol  inhaler prn. . Reports she has not been taking her inhaler and has been doing good. deneis any SOB or wheezing or chest tightness. .    11/12/15;  Spirometry - suggestive of moderate to severe Obstructive disease. FEv1- 52 %.   Chronic h/o smoking- 50 yrs. 7-11 cig/ day   CXR- normal.  Reports being stable, nmo shortness of breath,not using inhalers  Smokes 3-4 cig/day       Idiopathic Thrombocytopenia Purpura (Cms/Hcc)    Chronic. Stable.   No h/o abnormal bleeding episodes..   Lab Results   Component Value Date    WBC 4.2 01/06/2024    HGB 13.0 01/06/2024    HCT 42.4 01/06/2024    MCV 97 01/06/2024    PLT 141 (L) 01/06/2024                Review of Systems   Constitutional:  Negative for chills, fatigue and fever.   HENT:   Negative for congestion.    Respiratory:  Negative for cough, chest tightness, shortness of breath and wheezing.    Cardiovascular:  Negative for chest pain, palpitations and leg swelling.   Gastrointestinal:  Negative for diarrhea, nausea and vomiting.   Musculoskeletal:  Negative for arthralgias.   Neurological:  Negative for dizziness, seizures, speech difficulty, numbness and headaches.       Objective     BP 102/56 (BP Site: Right arm, Patient Position: Sitting, Cuff Size: Medium)   Pulse 65   Temp 97.8 F (36.6 C) (Tympanic)   Wt 49.9 kg (110 lb)   BMI 28.06 kg/m   Physical Exam  Vitals and nursing note reviewed.   Constitutional:       Appearance: She is well-developed.   HENT:      Head: Normocephalic and atraumatic.   Cardiovascular:      Rate and Rhythm: Normal rate and regular rhythm.      Heart sounds: Normal heart sounds.   Pulmonary:      Effort:  Pulmonary effort is normal.      Breath sounds: Normal breath sounds.   Skin:     General: Skin is warm and dry.   Neurological:      Mental Status: She is alert and oriented to person, place, and time.   Psychiatric:         Behavior: Behavior normal.         Thought Content: Thought content normal.         Judgment: Judgment normal.       Physical Exam       Results  LABS    TSH: 5.06    CBC: Thrombocytopenia (slightly low platelets)      Assessment/Plan     1. Elevated TSH  TSH    TSH      2. Cervical myelopathy (CMS/HCC)        3. Idiopathic thrombocytopenia purpura (CMS/HCC)        4. Chronic obstructive pulmonary disease, unspecified COPD type (CMS/HCC)            Assessment & Plan  Thyroid  Stimulating Hormone (TSH) Elevation  TSH slightly elevated at 5.06, mild and asymptomatic. Explained benign nature and monitoring plan.  - Recheck TSH levels in 8 weeks.    Tremor and Neuropathy  Continues to experience tremors and neuropathy symptoms. MRI pending.  - Encourage completion of the previously ordered MRI.    Chronic Obstructive Pulmonary Disease  (COPD)  COPD likely due to smoking. No current dyspnea, still smokes 4-5 cigarettes per day.    General Health Maintenance  Active lifestyle, good appetite, lives alone with regular visits from son.    Follow-up  - Follow up in 8 weeks for TSH level recheck.  - Encourage completion of MRI for tremor and neuropathy symptoms.    Verbal consent obtained to record this visit.

## 2024-02-02 ENCOUNTER — Other Ambulatory Visit (INDEPENDENT_AMBULATORY_CARE_PROVIDER_SITE_OTHER): Payer: Self-pay | Admitting: Family

## 2024-02-02 ENCOUNTER — Ambulatory Visit (INDEPENDENT_AMBULATORY_CARE_PROVIDER_SITE_OTHER): Payer: Self-pay | Admitting: Family

## 2024-02-02 DIAGNOSIS — R7989 Other specified abnormal findings of blood chemistry: Secondary | ICD-10-CM

## 2024-02-02 LAB — TSH: TSH: 5.96 u[IU]/mL — ABNORMAL HIGH (ref 0.450–4.500)

## 2024-02-03 ENCOUNTER — Other Ambulatory Visit

## 2024-03-17 ENCOUNTER — Ambulatory Visit (INDEPENDENT_AMBULATORY_CARE_PROVIDER_SITE_OTHER): Admitting: Family

## 2024-03-22 ENCOUNTER — Ambulatory Visit (INDEPENDENT_AMBULATORY_CARE_PROVIDER_SITE_OTHER): Admitting: Family

## 2024-03-29 ENCOUNTER — Telehealth: Payer: Self-pay | Admitting: Family

## 2024-03-29 ENCOUNTER — Other Ambulatory Visit

## 2024-03-29 NOTE — Telephone Encounter (Signed)
 Copied from CRM [id number]. Topic: Appointment Scheduling - Schedule Appointment  >> Mar 29, 2024 10:07 AM Zelda BRAVO wrote:  [facility] IP called about Appointment Scheduling - Schedule Appointment.  Additional details:pt requesting app for OV and labs with PCP Hamiyet but no opening until December

## 2024-03-29 NOTE — Telephone Encounter (Signed)
 Copied from CRM #[id number]. Topic: Clinical Support - Medical Question  >> Mar 29, 2024  9:58 AM Zelda BRAVO wrote:  [facility] IP called about Clinical Support - Medical Question.  Additional details: patient needs new order for lipid panel entered in her chart.

## 2024-03-30 ENCOUNTER — Ambulatory Visit (INDEPENDENT_AMBULATORY_CARE_PROVIDER_SITE_OTHER): Payer: Self-pay

## 2024-03-30 LAB — T4, FREE: T4, Free: 1.06 ng/dL (ref 0.82–1.77)

## 2024-03-30 LAB — TSH: TSH: 3.81 u[IU]/mL (ref 0.450–4.500)

## 2024-03-31 ENCOUNTER — Ambulatory Visit (INDEPENDENT_AMBULATORY_CARE_PROVIDER_SITE_OTHER): Admitting: Family

## 2024-05-06 ENCOUNTER — Other Ambulatory Visit (INDEPENDENT_AMBULATORY_CARE_PROVIDER_SITE_OTHER): Payer: Self-pay | Admitting: Family

## 2024-07-17 NOTE — Progress Notes (Unsigned)
  [facility]     {  Disappearing Text  Click a link below to be taken to that activity or part of the chart   Chart Review  Order Review  Review Flowsheets  Labs  Health Maintenance  Immunizations  Allergies  Medications  Problem List  History  Synopsis   :[id number]}      Subjective   No chief complaint on file.    History of Present Illness    Review of Systems    Objective   There were no vitals taken for this visit.  Physical Exam  Physical Exam       Results        Assessment/Plan     Assessment & Plan      Verbal consent obtained to record this visit.

## 2024-07-18 ENCOUNTER — Ambulatory Visit
Admission: RE | Admit: 2024-07-18 | Discharge: 2024-07-18 | Disposition: A | Discharge status: Home / Self Care | Source: Ambulatory Visit

## 2024-07-18 ENCOUNTER — Other Ambulatory Visit: Payer: Self-pay

## 2024-07-18 ENCOUNTER — Ambulatory Visit

## 2024-07-18 ENCOUNTER — Ambulatory Visit (INDEPENDENT_AMBULATORY_CARE_PROVIDER_SITE_OTHER): Admitting: Family

## 2024-07-18 ENCOUNTER — Encounter (INDEPENDENT_AMBULATORY_CARE_PROVIDER_SITE_OTHER): Payer: Self-pay | Admitting: Family

## 2024-07-18 ENCOUNTER — Ambulatory Visit
Admission: RE | Admit: 2024-07-18 | Discharge: 2024-07-18 | Disposition: A | Discharge status: Home / Self Care | Source: Ambulatory Visit | Attending: Family | Admitting: Family

## 2024-07-18 VITALS — BP 121/72 | HR 76 | Temp 97.8°F | Resp 16 | Ht <= 58 in | Wt 108.0 lb

## 2024-07-18 DIAGNOSIS — M9906 Segmental and somatic dysfunction of lower extremity: Secondary | ICD-10-CM | POA: Insufficient documentation

## 2024-07-18 DIAGNOSIS — M9901 Segmental and somatic dysfunction of cervical region: Secondary | ICD-10-CM

## 2024-07-18 DIAGNOSIS — M9903 Segmental and somatic dysfunction of lumbar region: Secondary | ICD-10-CM

## 2024-07-18 DIAGNOSIS — M9905 Segmental and somatic dysfunction of pelvic region: Secondary | ICD-10-CM

## 2024-07-18 DIAGNOSIS — M533 Sacrococcygeal disorders, not elsewhere classified: Secondary | ICD-10-CM

## 2024-07-18 DIAGNOSIS — D693 Immune thrombocytopenic purpura: Secondary | ICD-10-CM

## 2024-07-18 DIAGNOSIS — M9904 Segmental and somatic dysfunction of sacral region: Secondary | ICD-10-CM

## 2024-07-18 DIAGNOSIS — M5442 Lumbago with sciatica, left side: Secondary | ICD-10-CM

## 2024-07-18 DIAGNOSIS — M5441 Lumbago with sciatica, right side: Secondary | ICD-10-CM

## 2024-07-18 DIAGNOSIS — E7800 Pure hypercholesterolemia, unspecified: Secondary | ICD-10-CM

## 2024-07-18 DIAGNOSIS — R2689 Other abnormalities of gait and mobility: Secondary | ICD-10-CM

## 2024-07-18 DIAGNOSIS — M25561 Pain in right knee: Secondary | ICD-10-CM

## 2024-07-18 DIAGNOSIS — R251 Tremor, unspecified: Secondary | ICD-10-CM

## 2024-07-18 DIAGNOSIS — M25562 Pain in left knee: Secondary | ICD-10-CM

## 2024-07-18 DIAGNOSIS — G8929 Other chronic pain: Secondary | ICD-10-CM

## 2024-07-19 ENCOUNTER — Ambulatory Visit (INDEPENDENT_AMBULATORY_CARE_PROVIDER_SITE_OTHER): Payer: Self-pay | Admitting: Family

## 2024-07-19 LAB — CBC AND DIFFERENTIAL
Baso(Absolute): 0 x10E3/uL (ref 0.0–0.2)
Basophils Automated: 1 %
Eosinophils Absolute: 0 x10E3/uL (ref 0.0–0.4)
Eosinophils Automated: 1 %
Hematocrit: 38.7 % (ref 34.0–46.6)
Hemoglobin: 12.4 g/dL (ref 11.1–15.9)
Immature Granulocytes Absolute: 0 x10E3/uL (ref 0.0–0.1)
Immature Granulocytes: 0 %
Lymphocytes Absolute: 1.1 x10E3/uL (ref 0.7–3.1)
Lymphocytes Automated: 27 %
MCH: 30.4 pg (ref 26.6–33.0)
MCHC: 32 g/dL (ref 31.5–35.7)
MCV: 95 fL (ref 79–97)
Monocytes Absolute: 0.3 x10E3/uL (ref 0.1–0.9)
Monocytes: 7 %
Neutrophils Absolute Count: 2.6 x10E3/uL (ref 1.4–7.0)
Neutrophils: 64 %
Platelets: 155 x10E3/uL (ref 150–450)
RBC: 4.08 x10E6/uL (ref 3.77–5.28)
RDW: 13 % (ref 11.7–15.4)
WBC: 4.1 x10E3/uL (ref 3.4–10.8)

## 2024-07-19 LAB — COMPREHENSIVE METABOLIC PANEL
ALT: 7 IU/L (ref 0–32)
AST (SGOT): 19 IU/L (ref 0–40)
Albumin: 4.2 g/dL (ref 3.7–4.7)
Alkaline Phosphatase: 76 IU/L (ref 48–129)
BUN / Creatinine Ratio: 27 (ref 12–28)
BUN: 13 mg/dL (ref 8–27)
Bilirubin, Total: 0.5 mg/dL (ref 0.0–1.2)
CO2: 26 mmol/L (ref 20–29)
Calcium: 8.9 mg/dL (ref 8.7–10.3)
Chloride: 105 mmol/L (ref 96–106)
Creatinine: 0.49 mg/dL — ABNORMAL LOW (ref 0.57–1.00)
Globulin, Total: 2.1 g/dL (ref 1.5–4.5)
Glucose: 90 mg/dL (ref 70–99)
Potassium: 4.3 mmol/L (ref 3.5–5.2)
Protein, Total: 6.3 g/dL (ref 6.0–8.5)
Sodium: 142 mmol/L (ref 134–144)
eGFR: 93 mL/min/1.73 (ref >59)

## 2024-07-19 LAB — LIPID PANEL
Cholesterol / HDL Ratio: 2.9 ratio (ref 0.0–4.4)
Cholesterol: 194 mg/dL (ref 100–199)
HDL: 66 mg/dL (ref >39)
LDL Chol Calculated (NIH): 118 mg/dL — ABNORMAL HIGH (ref 0–99)
Triglycerides: 54 mg/dL (ref 0–149)
VLDL Calculated: 10 mg/dL (ref 5–40)
